# Patient Record
Sex: Male | Born: 1960
Health system: Southern US, Community
[De-identification: ages and names within clinical notes are randomized; demographics above are authoritative.]

## PROBLEM LIST (undated history)

## (undated) ENCOUNTER — Ambulatory Visit (HOSPITAL_COMMUNITY): Admission: EM | Payer: BC Managed Care – PPO

## (undated) DIAGNOSIS — Z87442 Personal history of urinary calculi: Secondary | ICD-10-CM

## (undated) DIAGNOSIS — D352 Benign neoplasm of pituitary gland: Secondary | ICD-10-CM

## (undated) DIAGNOSIS — Z87898 Personal history of other specified conditions: Secondary | ICD-10-CM

## (undated) DIAGNOSIS — D649 Anemia, unspecified: Secondary | ICD-10-CM

## (undated) DIAGNOSIS — H3320 Serous retinal detachment, unspecified eye: Secondary | ICD-10-CM

## (undated) DIAGNOSIS — N189 Chronic kidney disease, unspecified: Secondary | ICD-10-CM

## (undated) DIAGNOSIS — Z8639 Personal history of other endocrine, nutritional and metabolic disease: Secondary | ICD-10-CM

## (undated) DIAGNOSIS — I10 Essential (primary) hypertension: Secondary | ICD-10-CM

## (undated) DIAGNOSIS — E119 Type 2 diabetes mellitus without complications: Secondary | ICD-10-CM

## (undated) DIAGNOSIS — H543 Unqualified visual loss, both eyes: Secondary | ICD-10-CM

## (undated) HISTORY — PX: EYE SURGERY: SHX253

## (undated) HISTORY — PX: NO PAST SURGERIES: SHX2092

## (undated) HISTORY — PX: COLONOSCOPY: SHX174

## (undated) HISTORY — DX: Chronic kidney disease, unspecified: N18.9

## (undated) HISTORY — DX: Anemia, unspecified: D64.9

## (undated) HISTORY — DX: Benign neoplasm of pituitary gland: D35.2

## (undated) HISTORY — DX: Personal history of other endocrine, nutritional and metabolic disease: Z86.39

---

## 1985-03-31 HISTORY — PX: KIDNEY STONE SURGERY: SHX686

## 2014-07-18 ENCOUNTER — Observation Stay (HOSPITAL_COMMUNITY)
Admission: EM | Admit: 2014-07-18 | Discharge: 2014-07-19 | Disposition: A | Discharge status: Home / Self Care | Payer: BLUE CROSS/BLUE SHIELD | Attending: Internal Medicine | Admitting: Internal Medicine

## 2014-07-18 ENCOUNTER — Encounter (HOSPITAL_COMMUNITY): Payer: Self-pay | Admitting: Emergency Medicine

## 2014-07-18 ENCOUNTER — Observation Stay (HOSPITAL_COMMUNITY): Payer: BLUE CROSS/BLUE SHIELD

## 2014-07-18 ENCOUNTER — Emergency Department (HOSPITAL_COMMUNITY): Payer: BLUE CROSS/BLUE SHIELD

## 2014-07-18 DIAGNOSIS — D352 Benign neoplasm of pituitary gland: Secondary | ICD-10-CM | POA: Diagnosis not present

## 2014-07-18 DIAGNOSIS — R350 Frequency of micturition: Secondary | ICD-10-CM | POA: Insufficient documentation

## 2014-07-18 DIAGNOSIS — Z79899 Other long term (current) drug therapy: Secondary | ICD-10-CM | POA: Diagnosis not present

## 2014-07-18 DIAGNOSIS — IMO0002 Reserved for concepts with insufficient information to code with codable children: Secondary | ICD-10-CM | POA: Diagnosis present

## 2014-07-18 DIAGNOSIS — E221 Hyperprolactinemia: Secondary | ICD-10-CM | POA: Insufficient documentation

## 2014-07-18 DIAGNOSIS — R51 Headache: Secondary | ICD-10-CM | POA: Diagnosis present

## 2014-07-18 DIAGNOSIS — E1165 Type 2 diabetes mellitus with hyperglycemia: Secondary | ICD-10-CM | POA: Diagnosis not present

## 2014-07-18 DIAGNOSIS — Z6836 Body mass index (BMI) 36.0-36.9, adult: Secondary | ICD-10-CM | POA: Diagnosis not present

## 2014-07-18 DIAGNOSIS — I10 Essential (primary) hypertension: Principal | ICD-10-CM | POA: Insufficient documentation

## 2014-07-18 DIAGNOSIS — R0602 Shortness of breath: Secondary | ICD-10-CM

## 2014-07-18 DIAGNOSIS — Z7982 Long term (current) use of aspirin: Secondary | ICD-10-CM | POA: Diagnosis not present

## 2014-07-18 DIAGNOSIS — I169 Hypertensive crisis, unspecified: Secondary | ICD-10-CM | POA: Diagnosis present

## 2014-07-18 DIAGNOSIS — R930 Abnormal findings on diagnostic imaging of skull and head, not elsewhere classified: Secondary | ICD-10-CM

## 2014-07-18 DIAGNOSIS — I160 Hypertensive urgency: Secondary | ICD-10-CM

## 2014-07-18 HISTORY — DX: Essential (primary) hypertension: I10

## 2014-07-18 HISTORY — DX: Type 2 diabetes mellitus without complications: E11.9

## 2014-07-18 LAB — COMPREHENSIVE METABOLIC PANEL
ALT: 19 U/L (ref 0–53)
ANION GAP: 9 (ref 5–15)
AST: 19 U/L (ref 0–37)
Albumin: 3.7 g/dL (ref 3.5–5.2)
Alkaline Phosphatase: 105 U/L (ref 39–117)
BUN: 16 mg/dL (ref 6–23)
CALCIUM: 9.3 mg/dL (ref 8.4–10.5)
CHLORIDE: 99 mmol/L (ref 96–112)
CO2: 28 mmol/L (ref 19–32)
CREATININE: 1 mg/dL (ref 0.50–1.35)
GFR, EST NON AFRICAN AMERICAN: 82 mL/min — AB (ref >90)
GLUCOSE: 380 mg/dL — AB (ref 70–99)
Potassium: 3.8 mmol/L (ref 3.5–5.1)
SODIUM: 136 mmol/L (ref 135–145)
Total Bilirubin: 0.5 mg/dL (ref 0.3–1.2)
Total Protein: 7.8 g/dL (ref 6.0–8.3)

## 2014-07-18 LAB — CBC WITH DIFFERENTIAL/PLATELET
Basophils Absolute: 0 10*3/uL (ref 0.0–0.1)
Basophils Relative: 0 % (ref 0–1)
Eosinophils Absolute: 0.1 10*3/uL (ref 0.0–0.7)
Eosinophils Relative: 2 % (ref 0–5)
HCT: 37.1 % — ABNORMAL LOW (ref 39.0–52.0)
HEMOGLOBIN: 11.9 g/dL — AB (ref 13.0–17.0)
Lymphocytes Relative: 21 % (ref 12–46)
Lymphs Abs: 1 10*3/uL (ref 0.7–4.0)
MCH: 28.5 pg (ref 26.0–34.0)
MCHC: 32.1 g/dL (ref 30.0–36.0)
MCV: 89 fL (ref 78.0–100.0)
MONOS PCT: 8 % (ref 3–12)
Monocytes Absolute: 0.4 10*3/uL (ref 0.1–1.0)
NEUTROS PCT: 69 % (ref 43–77)
Neutro Abs: 3.3 10*3/uL (ref 1.7–7.7)
Platelets: 230 10*3/uL (ref 150–400)
RBC: 4.17 MIL/uL — AB (ref 4.22–5.81)
RDW: 12.7 % (ref 11.5–15.5)
WBC: 4.8 10*3/uL (ref 4.0–10.5)

## 2014-07-18 LAB — URINALYSIS, ROUTINE W REFLEX MICROSCOPIC
Bilirubin Urine: NEGATIVE
KETONES UR: 15 mg/dL — AB
LEUKOCYTES UA: NEGATIVE
Nitrite: NEGATIVE
Protein, ur: 100 mg/dL — AB
Specific Gravity, Urine: 1.026 (ref 1.005–1.030)
UROBILINOGEN UA: 0.2 mg/dL (ref 0.0–1.0)
pH: 6 (ref 5.0–8.0)

## 2014-07-18 LAB — BASIC METABOLIC PANEL
Anion gap: 10 (ref 5–15)
BUN: 16 mg/dL (ref 6–23)
CO2: 29 mmol/L (ref 19–32)
CREATININE: 1.13 mg/dL (ref 0.50–1.35)
Calcium: 9.1 mg/dL (ref 8.4–10.5)
Chloride: 97 mmol/L (ref 96–112)
GFR, EST AFRICAN AMERICAN: 82 mL/min — AB (ref >90)
GFR, EST NON AFRICAN AMERICAN: 71 mL/min — AB (ref >90)
GLUCOSE: 324 mg/dL — AB (ref 70–99)
Potassium: 4.5 mmol/L (ref 3.5–5.1)
Sodium: 136 mmol/L (ref 135–145)

## 2014-07-18 LAB — URINE MICROSCOPIC-ADD ON

## 2014-07-18 LAB — GLUCOSE, CAPILLARY
GLUCOSE-CAPILLARY: 291 mg/dL — AB (ref 70–99)
GLUCOSE-CAPILLARY: 345 mg/dL — AB (ref 70–99)
GLUCOSE-CAPILLARY: 221 mg/dL — AB (ref 70–99)
Glucose-Capillary: 379 mg/dL — ABNORMAL HIGH (ref 70–99)
Glucose-Capillary: 388 mg/dL — ABNORMAL HIGH (ref 70–99)

## 2014-07-18 LAB — BRAIN NATRIURETIC PEPTIDE: B Natriuretic Peptide: 87.3 pg/mL (ref 0.0–100.0)

## 2014-07-18 LAB — TROPONIN I: Troponin I: <0.03 ng/mL (ref <0.031)

## 2014-07-18 LAB — T4, FREE: Free T4: 1.01 ng/dL (ref 0.80–1.80)

## 2014-07-18 LAB — TSH: TSH: 0.404 u[IU]/mL (ref 0.350–4.500)

## 2014-07-18 MED ORDER — OXYCODONE HCL 5 MG PO TABS: 5.0000 mg | ORAL_TABLET | ORAL | Status: DC | PRN

## 2014-07-18 MED ORDER — ACETAMINOPHEN 650 MG RE SUPP: 650.0000 mg | Freq: Four times a day (QID) | RECTAL | Status: DC | PRN

## 2014-07-18 MED ORDER — SODIUM CHLORIDE 0.9 % IV SOLN: 250.0000 mL | INTRAVENOUS | Status: DC | PRN

## 2014-07-18 MED ORDER — SODIUM CHLORIDE 0.9 % IJ SOLN: 3.0000 mL | Freq: Two times a day (BID) | INTRAMUSCULAR | Status: DC

## 2014-07-18 MED ORDER — INSULIN ASPART 100 UNIT/ML ~~LOC~~ SOLN
10.0000 [IU] | Freq: Once | SUBCUTANEOUS | Status: AC
  Administered 2014-07-18: 10 [IU] via SUBCUTANEOUS

## 2014-07-18 MED ORDER — METOPROLOL SUCCINATE ER 50 MG PO TB24
50.0000 mg | ORAL_TABLET | Freq: Every day | ORAL | Status: DC
  Administered 2014-07-19: 50 mg via ORAL
  Filled 2014-07-18: qty 1

## 2014-07-18 MED ORDER — HEPARIN SODIUM (PORCINE) 5000 UNIT/ML IJ SOLN
5000.0000 [IU] | Freq: Three times a day (TID) | INTRAMUSCULAR | Status: DC
  Administered 2014-07-18 – 2014-07-19 (×5): 5000 [IU] via SUBCUTANEOUS
  Filled 2014-07-18 (×7): qty 1

## 2014-07-18 MED ORDER — INSULIN ASPART 100 UNIT/ML ~~LOC~~ SOLN
0.0000 [IU] | Freq: Every day | SUBCUTANEOUS | Status: DC
  Administered 2014-07-18: 2 [IU] via SUBCUTANEOUS

## 2014-07-18 MED ORDER — ONDANSETRON HCL 4 MG PO TABS
4.0000 mg | ORAL_TABLET | Freq: Four times a day (QID) | ORAL | Status: DC | PRN
  Filled 2014-07-18: qty 1

## 2014-07-18 MED ORDER — AMLODIPINE BESYLATE 10 MG PO TABS
10.0000 mg | ORAL_TABLET | Freq: Every day | ORAL | Status: DC
  Administered 2014-07-18 – 2014-07-19 (×2): 10 mg via ORAL
  Filled 2014-07-18 (×2): qty 1

## 2014-07-18 MED ORDER — ASPIRIN EC 81 MG PO TBEC
81.0000 mg | DELAYED_RELEASE_TABLET | Freq: Every day | ORAL | Status: DC
  Administered 2014-07-18 – 2014-07-19 (×2): 81 mg via ORAL
  Filled 2014-07-18 (×2): qty 1

## 2014-07-18 MED ORDER — INSULIN ASPART 100 UNIT/ML ~~LOC~~ SOLN
12.0000 [IU] | Freq: Once | SUBCUTANEOUS | Status: AC
  Administered 2014-07-18: 12 [IU] via SUBCUTANEOUS

## 2014-07-18 MED ORDER — ACETAMINOPHEN 325 MG PO TABS
650.0000 mg | ORAL_TABLET | Freq: Four times a day (QID) | ORAL | Status: DC | PRN
  Administered 2014-07-19 (×2): 650 mg via ORAL
  Filled 2014-07-18 (×3): qty 2

## 2014-07-18 MED ORDER — NICARDIPINE HCL IN NACL 20-0.86 MG/200ML-% IV SOLN
3.0000 mg/h | Freq: Once | INTRAVENOUS | Status: AC
  Administered 2014-07-18: 5 mg/h via INTRAVENOUS
  Filled 2014-07-18: qty 200

## 2014-07-18 MED ORDER — ONDANSETRON HCL 4 MG/2ML IJ SOLN
4.0000 mg | Freq: Once | INTRAMUSCULAR | Status: AC
  Administered 2014-07-18: 4 mg via INTRAVENOUS
  Filled 2014-07-18: qty 2

## 2014-07-18 MED ORDER — SODIUM CHLORIDE 0.9 % IJ SOLN
3.0000 mL | Freq: Two times a day (BID) | INTRAMUSCULAR | Status: DC
  Administered 2014-07-18 – 2014-07-19 (×3): 3 mL via INTRAVENOUS

## 2014-07-18 MED ORDER — INSULIN ASPART 100 UNIT/ML ~~LOC~~ SOLN
0.0000 [IU] | Freq: Three times a day (TID) | SUBCUTANEOUS | Status: DC
  Administered 2014-07-18: 11 [IU] via SUBCUTANEOUS
  Administered 2014-07-18: 15 [IU] via SUBCUTANEOUS
  Administered 2014-07-19 (×3): 8 [IU] via SUBCUTANEOUS

## 2014-07-18 MED ORDER — GLIPIZIDE ER 10 MG PO TB24
10.0000 mg | ORAL_TABLET | Freq: Every day | ORAL | Status: DC
  Administered 2014-07-19: 10 mg via ORAL
  Filled 2014-07-18 (×2): qty 1

## 2014-07-18 MED ORDER — ALUM & MAG HYDROXIDE-SIMETH 200-200-20 MG/5ML PO SUSP: 30.0000 mL | Freq: Four times a day (QID) | ORAL | Status: DC | PRN

## 2014-07-18 MED ORDER — GADOBENATE DIMEGLUMINE 529 MG/ML IV SOLN
15.0000 mL | Freq: Once | INTRAVENOUS | Status: AC | PRN
  Administered 2014-07-18: 15 mL via INTRAVENOUS

## 2014-07-18 MED ORDER — ONDANSETRON HCL 4 MG/2ML IJ SOLN
4.0000 mg | Freq: Four times a day (QID) | INTRAMUSCULAR | Status: DC | PRN
  Administered 2014-07-18: 4 mg via INTRAVENOUS
  Filled 2014-07-18: qty 2

## 2014-07-18 MED ORDER — ISOSORBIDE MONONITRATE ER 60 MG PO TB24
60.0000 mg | ORAL_TABLET | Freq: Every day | ORAL | Status: DC
  Administered 2014-07-18 – 2014-07-19 (×2): 60 mg via ORAL
  Filled 2014-07-18 (×2): qty 1

## 2014-07-18 MED ORDER — GLIPIZIDE ER 5 MG PO TB24
5.0000 mg | ORAL_TABLET | Freq: Every day | ORAL | Status: DC
  Administered 2014-07-18: 5 mg via ORAL
  Filled 2014-07-18 (×2): qty 1

## 2014-07-18 MED ORDER — SODIUM CHLORIDE 0.9 % IJ SOLN: 3.0000 mL | INTRAMUSCULAR | Status: DC | PRN

## 2014-07-18 MED ORDER — METOPROLOL SUCCINATE ER 50 MG PO TB24
50.0000 mg | ORAL_TABLET | Freq: Once | ORAL | Status: AC
  Administered 2014-07-18: 50 mg via ORAL
  Filled 2014-07-18 (×2): qty 1

## 2014-07-18 NOTE — ED Notes (Signed)
Pt states about 7pm he just generally did not feel good  Pt states he felt nauseated and had a headache  Pt states he checked his pressure and it was 240/127  Pt states he took an extra dose of Imdur and laid down  Pt states he was unable to go to sleep  Pt states when he got up to walk around he had dyspnia on exertion  Pt denies chest pain  Pt rating his headache pain an 8/10 and the pain is in the middle of his head  No neuro deficits noted

## 2014-07-18 NOTE — ED Notes (Signed)
Patient transported to CT 

## 2014-07-18 NOTE — Progress Notes (Addendum)
Progress Note  MRI/MRA followed was up. He has a 2.3 x 2 x 1.8 cm mass centered in the sella with suprasellar extension, having appearance of pituitary macroadenoma. Will assess basal hormonal function, will check Prolactin, AM Cortisol level, FSH/LH, TSH, FT3, FT4, GH. I discussed case with Dr Earnie Larsson of Neurosurgery who reviewed scans. There is no need for emergent surgery at this time however surgery will likely be a future intervention, particularly with compression on the optic chiasm and optic tracts. Patient will need to follow up with NS in 1 week. Follow up appointment requested on EPIC.    These findings and recommendations were reviewed with patient.

## 2014-07-18 NOTE — ED Notes (Signed)
Per EMS pt has hypertension and takes Imdur  Pt states about 7pm last night he started feeling like his pressure was elevated so he took an extra blood pressure tablet  Pt is c/o headache, nausea  Pt is also a type 2 diabetic and pts blood sugar is elevated at 388

## 2014-07-18 NOTE — ED Notes (Signed)
Cardene drip increased to 7.5 mg/h

## 2014-07-18 NOTE — ED Provider Notes (Signed)
CSN: [id number]     Arrival date & time 07/18/14  0222 History   First MD Initiated Contact with Patient 07/18/14 0254     Chief Complaint  Patient presents with  . Hypertension     (Consider location/radiation/quality/duration/timing/severity/associated sxs/prior Treatment) HPI Comments: Patient presents to the ER for evaluation of headache and elevated blood pressure. Patient reported that he started to feel weak this evening. He noticed that he was very short of breath with minimal exertion, but did not have any chest pain. He checked his blood pressure was 240/127. He took a dose of Imdur and laid down, thinking that he would feel better if he went to sleep. When he could not sleep, he noticed worsening dyspnea on exertion and developed a headache. Patient reports persistent throbbing pain in the middle of his head, 8 out of 10. Denies vision disturbance. Has not had any numbness, tingling or weakness in his extremities.  Patient is a 54 y.o. male presenting with hypertension.  Hypertension Associated symptoms include headaches and shortness of breath.    Past Medical History  Diagnosis Date  . Diabetes mellitus without complication   . Hypertension    History reviewed. No pertinent past surgical history. Family History  Problem Relation Age of Onset  . Cancer Other   . Diabetes Other   . Hypertension Other    History  Substance Use Topics  . Smoking status: Never Smoker   . Smokeless tobacco: Not on file  . Alcohol Use: No    Review of Systems  Respiratory: Positive for shortness of breath.   Neurological: Positive for headaches.  All other systems reviewed and are negative.     Allergies  Review of patient's allergies indicates no known allergies.  Home Medications   Prior to Admission medications   Medication Sig Start Date End Date Taking? Authorizing Provider  amLODipine (NORVASC) 10 MG tablet Take 10 mg by mouth daily.   Yes Historical Provider, MD   aspirin EC 81 MG tablet Take 81 mg by mouth daily.   Yes Historical Provider, MD  glipiZIDE (GLUCOTROL XL) 5 MG 24 hr tablet Take 5 mg by mouth daily with breakfast.   Yes Historical Provider, MD  isosorbide mononitrate (IMDUR) 60 MG 24 hr tablet Take 60 mg by mouth daily.   Yes Historical Provider, MD  metoprolol succinate (TOPROL-XL) 50 MG 24 hr tablet Take 50 mg by mouth daily. Take with or immediately following a meal.   Yes Historical Provider, MD   BP 159/81 mmHg  Pulse 98  Temp(Src) 98.5 F (36.9 C) (Oral)  Resp 22  SpO2 92% Physical Exam  Constitutional: He is oriented to person, place, and time. He appears well-developed and well-nourished. No distress.  HENT:  Head: Normocephalic and atraumatic.  Right Ear: Hearing normal.  Left Ear: Hearing normal.  Nose: Nose normal.  Mouth/Throat: Oropharynx is clear and moist and mucous membranes are normal.  Eyes: Conjunctivae and EOM are normal. Pupils are equal, round, and reactive to light.  Neck: Normal range of motion. Neck supple.  Cardiovascular: Regular rhythm, S1 normal and S2 normal.  Exam reveals no gallop and no friction rub.   No murmur heard. Pulmonary/Chest: Effort normal and breath sounds normal. No respiratory distress. He exhibits no tenderness.  Abdominal: Soft. Normal appearance and bowel sounds are normal. There is no hepatosplenomegaly. There is no tenderness. There is no rebound, no guarding, no tenderness at McBurney's point and negative Murphy's sign. No hernia.  Musculoskeletal: Normal range  of motion.  Neurological: He is alert and oriented to person, place, and time. He has normal strength. No cranial nerve deficit or sensory deficit. Coordination normal. GCS eye subscore is 4. GCS verbal subscore is 5. GCS motor subscore is 6.  Normal strength, sensation in all 4 extremities. Patient is awake, alert and oriented.  Patient's gait is unsteady, but no focal neurologic deficits noted otherwise  Skin: Skin is  warm, dry and intact. No rash noted. No cyanosis.  Psychiatric: He has a normal mood and affect. His speech is normal and behavior is normal. Thought content normal.  Nursing note and vitals reviewed.   ED Course  Procedures (including critical care time) Labs Review Labs Reviewed  CBC WITH DIFFERENTIAL/PLATELET - Abnormal; Notable for the following:    RBC 4.17 (*)    Hemoglobin 11.9 (*)    HCT 37.1 (*)    All other components within normal limits  COMPREHENSIVE METABOLIC PANEL - Abnormal; Notable for the following:    Glucose, Bld 380 (*)    GFR calc non Af Amer 82 (*)    All other components within normal limits  TROPONIN I  BRAIN NATRIURETIC PEPTIDE    Imaging Review Ct Head Wo Contrast  07/18/2014   CLINICAL DATA:  Nausea and headache.  Hypertension.  EXAM: CT HEAD WITHOUT CONTRAST  TECHNIQUE: Contiguous axial images were obtained from the base of the skull through the vertex without intravenous contrast.  COMPARISON:  None.  FINDINGS: 1.4 x 1.7 cm hyper attenuating mass within the sella with suprasellar extension. Associated osseous remodeling. Maintained gray-white differentiation. No hydrocephalus. No intraparenchymal hemorrhage. No abnormal extra-axial fluid collection. No CT evidence of an acute infarction. The visualized paranasal sinuses and mastoid air cells are predominantly clear.  IMPRESSION: 1.4 x 1.7 cm hyper attenuating mass within the sella/suprasellar. Differential includes Rathke's cleft cyst, pituitary adenoma, or aneurysm. Recommend brain MRI.   Electronically Signed   By: Carlos Levering M.D.   On: 07/18/2014 07:11   Dg Chest Port 1 View  07/18/2014   CLINICAL DATA:  Short of breath.  EXAM: PORTABLE CHEST - 1 VIEW  COMPARISON:  None.  FINDINGS: Mediastinum and hilar structures normal. Low lung volumes with mild basilar atelectasis. No pleural effusion or pneumothorax. Heart size normal. No acute bony abnormality.  IMPRESSION: Low lung volumes with mild basilar  atelectasis. Exam otherwise unremarkable.   Electronically Signed   By: Marcello Moores  Register   On: 07/18/2014 07:03     EKG Interpretation   Date/Time:  Tuesday July 18 2014 02:32:36 EDT Ventricular Rate:  80 PR Interval:  163 QRS Duration: 90 QT Interval:  363 QTC Calculation: 419 R Axis:   -10 Text Interpretation:  Sinus rhythm Probable left atrial enlargement  Borderline T wave abnormalities No previous tracing Confirmed by Loki Wuthrich   MD, Francelia Mclaren [id number]) on 07/18/2014 3:08:18 AM      MDM   Final diagnoses:  Shortness of breath  Hypertensive urgency    Patient presented ER for evaluation of headache, dyspnea on exertion, markedly elevated blood pressure. Patient reports a blood pressure of 240/127 at home. He did take additional blood pressure medication without relief of his symptoms. He developed progressively worsening headache associated with blood pressure. He does not normally have headaches. He did not have any focal neurologic deficits, but didn't appear unsteady on his feet when walking here in the ER. Patient's blood work is entirely normal. This included EKG and troponin. He is not expressing chest pain, but  did have dyspnea on exertion. No evidence of congestive heart failure.  Patient was initiated on a Cardene drip. Patient's blood pressure did immediately improve and his headache resolved. A CT of his head was performed. No evidence of acute intracranial bleed or other acute abnormality noted. He does, however, have a hyperdense area in the sella or suprasellar region. This could be a cyst, adenoma or possibly aneurysm. MRI recommended him will be performed. Patient to be admitted for further management of hypertensive urgency.    Orpah Greek, MD 07/18/14 [phone]

## 2014-07-18 NOTE — ED Notes (Signed)
Cardene drip decreased to 5mg /h

## 2014-07-18 NOTE — ED Notes (Signed)
Cardene drip increased to 10mg /h

## 2014-07-18 NOTE — ED Notes (Signed)
Returned from CT.

## 2014-07-18 NOTE — ED Notes (Signed)
Cardene drip decreased to 7.5mg /h

## 2014-07-18 NOTE — ED Notes (Signed)
Ambulated pt to the restroom  Gait slightly unsteady  Pt states he just does not feel quite right  States continues to feels a little short of breath on exertion  Spoke with EDP regarding pt condition

## 2014-07-18 NOTE — Progress Notes (Signed)
[facility] History and Physical  Sigismund Meskill [id number] DOB: 06-03-1960 DOA: 07/18/2014  Referring physician:  PCP: No primary care provider on file.   Chief Complaint: Headache/nausea  HPI: Roberto [name] is a 54 y.o. male with a past medical history of hypertension and diabetes mellitus presenting to the emergency department this morning with complaints of headache and nausea. He reported feeling ill yesterday evening becoming generally weak and having shortness of breath with physical exertion. He also experienced nausea without emesis. He checked his blood pressure at home having a reading of 240/127. Patient took dose of Imdur 60 mg by mouth overnight. Symptoms did not improve for which he presented to the emergency department. Initially he does not have a blood pressure 190/102 and started on a Cardene drip, with subsequent downward trend in blood pressures, having her last blood pressure 165/91. Cardene drip was discontinued in the emergency room. Patient reports feeling much better.  CT scan of brain did not show evidence of intracranial bleed however radiology reporting a 1.4 x 1.7 cm hyper attenuating mass within the sella/suprasellar region.                                                                                                                                                                                                                                Review of Systems:  Constitutional:  No weight loss, night sweats, Fevers, chills, fatigue, positive for generalized weakness.  HEENT:  No headaches, Difficulty swallowing,Tooth/dental problems,Sore throat,  No sneezing, itching, ear ache, nasal congestion, post nasal drip,  Cardio-vascular:  No chest pain, Orthopnea, PND, swelling in lower extremities, anasarca, dizziness, palpitations  GI:  No heartburn, indigestion, abdominal pain, nausea, vomiting, diarrhea, change in bowel habits, loss of appetite  Resp:   Positive forshortness of breath with exertion or at rest. No excess mucus, no productive cough, No non-productive cough, No coughing up of blood.No change in color of mucus.No wheezing.No chest wall deformity  Skin:  no rash or lesions.  GU:  no dysuria, change in color of urine, no urgency or frequency. No flank pain.  Musculoskeletal:  No joint pain or swelling. No decreased range of motion. No back pain.  Psych:  No change in mood or affect. No depression or anxiety. No memory loss.   Past Medical History  Diagnosis Date  . Diabetes mellitus without complication   . Hypertension    History reviewed. No pertinent past surgical history.  Social History:  reports that he has never smoked. He does not have any smokeless tobacco history on file. He reports that he does not drink alcohol or use illicit drugs.  No Known Allergies  Family History  Problem Relation Age of Onset  . Cancer Other   . Diabetes Other   . Hypertension Other      Prior to Admission medications   Medication Sig Start Date End Date Taking? Authorizing Provider  amLODipine (NORVASC) 10 MG tablet Take 10 mg by mouth daily.   Yes Historical Provider, MD  aspirin EC 81 MG tablet Take 81 mg by mouth daily.   Yes Historical Provider, MD  glipiZIDE (GLUCOTROL XL) 5 MG 24 hr tablet Take 5 mg by mouth daily with breakfast.   Yes Historical Provider, MD  isosorbide mononitrate (IMDUR) 60 MG 24 hr tablet Take 60 mg by mouth daily.   Yes Historical Provider, MD  metoprolol succinate (TOPROL-XL) 50 MG 24 hr tablet Take 50 mg by mouth daily. Take with or immediately following a meal.   Yes Historical Provider, MD   Physical Exam: Filed Vitals:   07/18/14 0730 07/18/14 0745 07/18/14 0800 07/18/14 0815  BP: 152/85 149/69 165/91 175/92  Pulse: 96 94 93 92  Temp:      TempSrc:      Resp:      SpO2: 93% 93% 95% 99%    Wt Readings from Last 3 Encounters:  No data found for Wt    General:  well-nourished well-developed  male, in no acute distress awake and alert following commands. Eyes: PERRL, normal lids, irises & conjunctiva ENT: grossly normal hearing, lips & tongue Neck: no LAD, masses or thyromegaly Cardiovascular: RRR, no m/r/g. He has 2+ bilateral extremity pitting edema Telemetry: SR, no arrhythmias  Respiratory: CTA bilaterally, no w/r/r. Normal respiratory effort.  Abdomen: soft, ntnd Skin: no rash or induration seen on limited exam Musculoskeletal: Has 2+ bilateral extremity pitting edema  Psychiatric: grossly normal mood and affect, speech fluent and appropriate Neurologic: grossly non-focal.          Labs on Admission:  Basic Metabolic Panel:  Recent Labs Lab 07/18/14 0325  NA 136  K 3.8  CL 99  CO2 28  GLUCOSE 380*  BUN 16  CREATININE 1.00  CALCIUM 9.3   Liver Function Tests:  Recent Labs Lab 07/18/14 0325  AST 19  ALT 19  ALKPHOS 105  BILITOT 0.5  PROT 7.8  ALBUMIN 3.7   No results for input(s): LIPASE, AMYLASE in the last 168 hours. No results for input(s): AMMONIA in the last 168 hours. CBC:  Recent Labs Lab 07/18/14 0325  WBC 4.8  NEUTROABS 3.3  HGB 11.9*  HCT 37.1*  MCV 89.0  PLT 230   Cardiac Enzymes:  Recent Labs Lab 07/18/14 0325  TROPONINI <0.03    BNP (last 3 results)  Recent Labs  07/18/14 0326  BNP 87.3    ProBNP (last 3 results) No results for input(s): PROBNP in the last 8760 hours.  CBG: No results for input(s): GLUCAP in the last 168 hours.  Radiological Exams on Admission: Ct Head Wo Contrast  07/18/2014   CLINICAL DATA:  Nausea and headache.  Hypertension.  EXAM: CT HEAD WITHOUT CONTRAST  TECHNIQUE: Contiguous axial images were obtained from the base of the skull through the vertex without intravenous contrast.  COMPARISON:  None.  FINDINGS: 1.4 x 1.7 cm hyper attenuating mass within the sella with suprasellar extension. Associated osseous remodeling. Maintained gray-white  differentiation. No hydrocephalus. No  intraparenchymal hemorrhage. No abnormal extra-axial fluid collection. No CT evidence of an acute infarction. The visualized paranasal sinuses and mastoid air cells are predominantly clear.  IMPRESSION: 1.4 x 1.7 cm hyper attenuating mass within the sella/suprasellar. Differential includes Rathke's cleft cyst, pituitary adenoma, or aneurysm. Recommend brain MRI.   Electronically Signed   By: Carlos Levering M.D.   On: 07/18/2014 07:11   Dg Chest Port 1 View  07/18/2014   CLINICAL DATA:  Short of breath.  EXAM: PORTABLE CHEST - 1 VIEW  COMPARISON:  None.  FINDINGS: Mediastinum and hilar structures normal. Low lung volumes with mild basilar atelectasis. No pleural effusion or pneumothorax. Heart size normal. No acute bony abnormality.  IMPRESSION: Low lung volumes with mild basilar atelectasis. Exam otherwise unremarkable.   Electronically Signed   By: Marcello Moores  Register   On: 07/18/2014 07:03    EKG: Independently reviewed.   Assessment/Plan Active Problems:   Hypertensive urgency   DM (diabetes mellitus), type 2, uncontrolled   Hypertensive crisis   1. Hypertensive crisis. Patient is a pleasant 54 year old gentleman with history of hypertension who takes metoprolol 50 mg by mouth daily, Norvasc 10 mg by mouth daily, Imdur 50 mg by mouth daily, felt ill overnight and found to have a blood pressure of 220/120. He took 60 mg of imdur however continue to feel ill. In the emergency room he was found to have a blood pressure of 198/102, placed on a Cardene drip with blood pressures trending down. Cardene has has been discontinued in the emergency room, will restart his oral regimen and give him metoprolol 50 mg by mouth now. Continue amlodipine 10 mg by mouth daily and Imdur 60 mg by mouth daily. Monitor blood pressures over the course of the day.  2. Type 2 diabetes mellitus.  On presentation patient having a glucose of 380 oh which she was given 12 units of insulin in the emergency department. Will place  him on Accu-Cheks before every meal and at bedtime with slight scale coverage. Will continue glipizide 5 mg by mouth daily.  3. Shortness of breath/lower extremity pitting edema. Patient reporting shortness of breath overnight with exertion, having bilateral extremity pitting edema on exam. Will further evaluate with a transthoracic echocardiogram today. He currently denies chest pain, having a troponin less than 0.03.  4.  Abnormal head CT. CT scan of brain without contrast performed in the emergency room showed a 1.4 x 1.7 cm hyperattenuating mass within the sella/suprasellar region. Will further workup with an MRI/MRA of brain  5.  DVT prophylaxis. Subcutaneous heparin    Code Status: full code  Family Communication: family not present  Disposition Plan: will place patient in overnight observation, do not anticipate him requiring greater than 2 night hospitalization.   Time spent: 65 min  Kelvin Cellar [facility] Pager [phone]

## 2014-07-18 NOTE — ED Notes (Signed)
MD at bedside. 

## 2014-07-19 DIAGNOSIS — E221 Hyperprolactinemia: Secondary | ICD-10-CM | POA: Diagnosis not present

## 2014-07-19 DIAGNOSIS — D352 Benign neoplasm of pituitary gland: Secondary | ICD-10-CM | POA: Diagnosis not present

## 2014-07-19 DIAGNOSIS — R0602 Shortness of breath: Secondary | ICD-10-CM | POA: Diagnosis not present

## 2014-07-19 DIAGNOSIS — E1165 Type 2 diabetes mellitus with hyperglycemia: Secondary | ICD-10-CM | POA: Diagnosis not present

## 2014-07-19 DIAGNOSIS — I10 Essential (primary) hypertension: Secondary | ICD-10-CM | POA: Diagnosis not present

## 2014-07-19 LAB — CBC
HEMATOCRIT: 38 % — AB (ref 39.0–52.0)
HEMOGLOBIN: 12.1 g/dL — AB (ref 13.0–17.0)
MCH: 29 pg (ref 26.0–34.0)
MCHC: 31.8 g/dL (ref 30.0–36.0)
MCV: 91.1 fL (ref 78.0–100.0)
Platelets: 199 10*3/uL (ref 150–400)
RBC: 4.17 MIL/uL — AB (ref 4.22–5.81)
RDW: 12.9 % (ref 11.5–15.5)
WBC: 5.3 10*3/uL (ref 4.0–10.5)

## 2014-07-19 LAB — CORTISOL: CORTISOL PLASMA: 8.4 ug/dL

## 2014-07-19 LAB — BASIC METABOLIC PANEL
ANION GAP: 8 (ref 5–15)
BUN: 16 mg/dL (ref 6–23)
CALCIUM: 9 mg/dL (ref 8.4–10.5)
CHLORIDE: 99 mmol/L (ref 96–112)
CO2: 29 mmol/L (ref 19–32)
Creatinine, Ser: 1.02 mg/dL (ref 0.50–1.35)
GFR calc non Af Amer: 80 mL/min — ABNORMAL LOW (ref >90)
Glucose, Bld: 268 mg/dL — ABNORMAL HIGH (ref 70–99)
Potassium: 3.7 mmol/L (ref 3.5–5.1)
Sodium: 136 mmol/L (ref 135–145)

## 2014-07-19 LAB — GLUCOSE, CAPILLARY
GLUCOSE-CAPILLARY: 261 mg/dL — AB (ref 70–99)
Glucose-Capillary: 266 mg/dL — ABNORMAL HIGH (ref 70–99)
Glucose-Capillary: 295 mg/dL — ABNORMAL HIGH (ref 70–99)

## 2014-07-19 LAB — FOLLICLE STIMULATING HORMONE: FSH: 1.5 m[IU]/mL (ref 1.5–12.4)

## 2014-07-19 LAB — GROWTH HORMONE: Growth Hormone: 0.3 ng/mL (ref 0.0–10.0)

## 2014-07-19 LAB — HEMOGLOBIN A1C
HEMOGLOBIN A1C: 12.1 % — AB (ref 4.8–5.6)
MEAN PLASMA GLUCOSE: 301 mg/dL

## 2014-07-19 LAB — LUTEINIZING HORMONE: LH: 1.6 m[IU]/mL — ABNORMAL LOW (ref 1.7–8.6)

## 2014-07-19 LAB — PROLACTIN: PROLACTIN: 1312 ng/mL — AB (ref 4.0–15.2)

## 2014-07-19 MED ORDER — LISINOPRIL 10 MG PO TABS: 10.0000 mg | ORAL_TABLET | Freq: Two times a day (BID) | ORAL | Status: DC

## 2014-07-19 MED ORDER — CIPROFLOXACIN HCL 250 MG PO TABS: 250.0000 mg | ORAL_TABLET | Freq: Two times a day (BID) | ORAL | Status: DC

## 2014-07-19 MED ORDER — LISINOPRIL 10 MG PO TABS
10.0000 mg | ORAL_TABLET | Freq: Two times a day (BID) | ORAL | Status: DC
  Administered 2014-07-19: 10 mg via ORAL
  Filled 2014-07-19 (×2): qty 1

## 2014-07-19 MED ORDER — METFORMIN HCL 500 MG PO TABS: 500.0000 mg | ORAL_TABLET | Freq: Two times a day (BID) | ORAL | Status: DC

## 2014-07-19 MED ORDER — GLIPIZIDE ER 5 MG PO TB24: 10.0000 mg | ORAL_TABLET | Freq: Every day | ORAL | Status: DC

## 2014-07-19 MED ORDER — METFORMIN HCL 500 MG PO TABS
500.0000 mg | ORAL_TABLET | Freq: Two times a day (BID) | ORAL | Status: DC
  Administered 2014-07-19: 500 mg via ORAL
  Filled 2014-07-19 (×2): qty 1

## 2014-07-19 MED ORDER — CIPROFLOXACIN HCL 250 MG PO TABS
250.0000 mg | ORAL_TABLET | Freq: Two times a day (BID) | ORAL | Status: DC
  Administered 2014-07-19: 250 mg via ORAL
  Filled 2014-07-19 (×3): qty 1

## 2014-07-19 NOTE — Progress Notes (Signed)
Inpatient Diabetes Program Recommendations  AACE/ADA: New Consensus Statement on Inpatient Glycemic Control (2013)  Target Ranges:  Prepandial:   less than 140 mg/dL      Peak postprandial:   less than 180 mg/dL (1-2 hours)      Critically ill patients:  140 - 180 mg/dL   Reason for Visit: Hyperglycemia  Diabetes history: DM2 Outpatient Diabetes medications: Glipizide 5 mg QAM Current orders for Inpatient glycemic control: Glipizide 10 mg QAM, Novolog moderate tidwc and hs  Results for HURBERT, DURAN (MRN [id number]) as of 07/19/2014 12:42  Ref. Range 07/18/2014 13:21 07/18/2014 17:06 07/18/2014 21:43 07/19/2014 07:41 07/19/2014 11:36  Glucose-Capillary Latest Ref Range: 70-99 mg/dL 291 (H) 345 (H) 221 (H) 266 (H) 261 (H)  Results for MADDON, HORTON (MRN [id number]) as of 07/19/2014 12:42  Ref. Range 07/18/2014 13:38  Hemoglobin A1C Latest Ref Range: 4.8-5.6 % 12.1 (H)   HgbA1C indicates poor glycemic control prior to admission. Will likely need to go home on insulin. Will talk with pt this afternoon regarding HgbA1C. Needs basal insulin.  Recommendations: Add basal insulin - Levemir 20 units QHS Add Novolog 4 units tidwc for meal coverage insulin.  *MD - will order Insulin Starter Kit and begin teaching insulin administration if pt is to be discharged on insulin.  Thank you. Lorenda Peck, RD, LDN, CDE Inpatient Diabetes Coordinator [phone]

## 2014-07-19 NOTE — Progress Notes (Signed)
UR completed 

## 2014-07-19 NOTE — Progress Notes (Signed)
Patient given discharge instructions, and verbalized an understanding of all discharge instructions.  Patient agrees with discharge plan, and is being discharged in stable medical condition.  Patient assisted to transportation.

## 2014-07-19 NOTE — Progress Notes (Signed)
CARE MANAGEMENT NOTE 07/19/2014  Patient:  Roberto [name],Roberto [name]   Account Number:  [id number]  Date Initiated:  07/19/2014  Documentation initiated by:  Edwyna Shell  Subjective/Objective Assessment:   54 yo male admitted with hypertensive urgency     Action/Plan:   discharge planning   Anticipated DC Date:  07/20/2014   Anticipated DC Plan:  [place]  CM consult      Choice offered to / List presented to:             Status of service:  Completed, signed off Medicare Important Message given?   (If response is "NO", the following Medicare IM given date fields will be blank) Date Medicare IM given:   Medicare IM given by:   Date Additional Medicare IM given:   Additional Medicare IM given by:    Discharge Disposition:  HOME/SELF CARE  Per UR Regulation:    If discussed at Long Length of Stay Meetings, dates discussed:    Comments:  07/19/14 Charise Killian RN BSN CM [phone] Patient stated that he has a PCP in [place]. 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 His PCP is [name].

## 2014-07-19 NOTE — Progress Notes (Signed)
  Echocardiogram 2D Echocardiogram has been performed.  Roberto [name] 07/19/2014, 10:18 AM

## 2014-07-19 NOTE — Discharge Summary (Signed)
Physician Discharge Summary  Roberto [name] [id number] DOB: [birth year] DOA: 07/18/2014  PCP: No primary care provider on file. [name] in [place] date: 07/18/2014 Discharge date: 07/19/2014  Time spent: Greater than 30 minutes  Recommendations for Outpatient Follow-up:  1. Dr. Elayne Snare, Endocrinology: to be seen in 5-7 days. MD's office will arrange appointment. 2. Dr. Earnie Larsson, Neurosurgery in 1 week 3. PCP   Discharge Diagnoses:  Active Problems:   Hypertensive urgency   DM (diabetes mellitus), type 2, uncontrolled   Hypertensive crisis   Discharge Condition: Improved & Stable  Diet recommendation: Heart Healthy & Diabetic diet.  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 His primary residence is in [place] but due to his travel and convenience, his PCP is in [place]. He has PMH of poorly controlled DM 2 (previous A1C apparently in 7-8 range) and HTN. On day of admission to the hospital, he was getting ready to travel/fly to [place] on business and started experiencing headache and nausea. He complained of weakness and ?DOE. He checked his BP which read 240/127 and took an extra dose of Imdur 60 mg. Symptoms did not improve and he presented to the ED where initial BP was 190/102 and he was briefly started on a Cardene drip. BP improved to 165/91 and Cardene was discontinued in the ED. CT Head showed mass in the sellar/suprasellar region. Hospitalist admission was requested.   Hospital Course:   Hypertensive Urgency - It appears that he may have poorly controlled HTN as OP as evidenced by LVH on echo. - Briefly place on Cardene drip in ED - Resumed on his home medications: Toprol XL 50 mg daily, Amlodipine 10 mg daily & Imdur 60 mg daily - BP's continued to be elevated and Lisinopril 10 mg BID was initiated which  would also be nephro protective given his h/o DM - He is advised to have his BMP recheck in 1-2 weeks to ensure stability of renal function. - His antihypertensives will need adjustment as OP. - He is counseled regarding weight loss and low salt diet. - If BP is resistant to control, may consider evaluation for secondary causes. - TSH normal: 0.404 - AM cortisol: 8.4  - DOE on admission was probably related to uncontrolled HTN. Trace ankle edema but no features of overt CHF (BNP 87). 2 D Echo showed LVH. Patient denied chest pain. Dyspnea resolved.   Pituitary Macroadenoma/Hyperprolactinemia/Prolactinoma - CT Head showed sellar/supprasellar mass (detailed report as below) - This was followed up with MRI/MRA brain which confirmed Pituitary macroadenoma with local mass effect (detailed report as below). - Patient denied h/o chronic headaches and headache that he presented with resolved shortly after admission. He denied visual symptoms, gynecomastia (states that he always has some breast enlargement which was unchanged), gallactorhea, erectile dysfunction or impotence. - Cortisol: 8.4 - TSH: 0.404, FT4: 1.01 - GH: 0.3 - LH: 1.6 - FSH: 1.5 - Prolactin level: 1312 - Discussed with Neurosurgeon Dr. Earnie Larsson on 07/19/14: he did not see a role for urgent surgery at this time, recommended Endocrinology consultation and medications alone may help shrink the tumor and surgery may not be needed and OP follow up with him in 1 week. - Discussed with Dr. Elayne Snare, Endocrinology on 07/19/2014: He preferred to see him in his office in early consultation and advised  to hold of on initiating any medications until office visit with him. His office would call patient with appointment. - Testosterone not checked: can be performed OP - Arranged CD with imaging studies as per patient request so he may seek 2nd opinion as needed.  Uncontrolled Type 2 Diabetes Mellitus - Glucotrol dose was increased from 5 mg to  10 mg daily. - Discussed option of starting Insulins with Dr. Dwyane Dee who recommended starting Metformin and OP follow up with him re further Mx. - HbA1C: 12.1 - Patient counseled re compliance with diet, medications, exercise, weight loss.  Presumed UTI - Urine microscopy positive for significant pyuria and bacteruria. - Patient had urinary frequency which may be due to his hyperglycemia too. He had no fever or chills. He had some intermittent LBP.  - Counseled to hold off Abx pending urine culture results follow up as OP. But patient insisted on initiating abx for UTI and hence was started on 3 days of PO Cipro for uncomplicated UTI. - Urine Culture was requested and patient reminded to provide sample but appears not to be sent.  Body mass index is 36.61 kg/(m^2). /Morbid Obesity - will need diet, exercise and weight loss. Counseled.   Consultations:  Discussed with Neurosurgery/Dr. Earnie Larsson   Discussed with Endocrinology/Dr. Elayne Snare.  Procedures:  2 D Echo 07/19/14:  Study Conclusions  - Left ventricle: The cavity size was normal. Wall thickness was increased in a pattern of moderate LVH. Systolic function was normal. The estimated ejection fraction was in the range of 60% to 65%. Wall motion was normal; there were no regional wall motion abnormalities. Doppler parameters are consistent with abnormal left ventricular relaxation (grade 1 diastolic dysfunction). - Left atrium: The atrium was mildly dilated. - Right atrium: The atrium was mildly dilated.   Discharge Exam:  Complaints:  Denied complaints. Headache, nausea and DOE resolved. No chest pain reported. Denied visual symptoms, breast enlargement, erectile dysfunction.  Filed Vitals:   07/19/14 0353 07/19/14 0552 07/19/14 1034 07/19/14 1401  BP: 185/104 173/89 160/110 150/94  Pulse: 77 77    Temp:  98.4 F (36.9 C)    TempSrc:  Oral    Resp:  18    Height:      Weight:      SpO2:  97%       General exam: Moderately built and obese male lying comfortably supine in bed. Respiratory system: Clear. No increased work of breathing. Cardiovascular system: S1 & S2 heard, RRR. No JVD, murmurs, gallops, clicks. Trace ankle edema.Tele: SR. Gastrointestinal system: Abdomen is nondistended, soft and nontender. Normal bowel sounds heard. Central nervous system: Alert and oriented. No focal neurological deficits. Extremities: Symmetric 5 x 5 power. HEENT: PERTLA, EOM movements intact.   Discharge Instructions      Discharge Instructions    Activity as tolerated - No restrictions    Complete by:  As directed      Call MD for:  extreme fatigue    Complete by:  As directed      Call MD for:  persistant dizziness or light-headedness    Complete by:  As directed      Call MD for:  persistant nausea and vomiting    Complete by:  As directed      Call MD for:  severe uncontrolled pain    Complete by:  As directed      Diet - low sodium heart healthy    Complete by:  As directed  Diet Carb Modified    Complete by:  As directed             Medication List    TAKE these medications        amLODipine 10 MG tablet  Commonly known as:  NORVASC  Take 10 mg by mouth daily.     aspirin EC 81 MG tablet  Take 81 mg by mouth daily.     ciprofloxacin 250 MG tablet  Commonly known as:  CIPRO  Take 1 tablet (250 mg total) by mouth 2 (two) times daily.     glipiZIDE 5 MG 24 hr tablet  Commonly known as:  GLUCOTROL XL  Take 2 tablets (10 mg total) by mouth daily with breakfast.     isosorbide mononitrate 60 MG 24 hr tablet  Commonly known as:  IMDUR  Take 60 mg by mouth daily.     lisinopril 10 MG tablet  Commonly known as:  PRINIVIL,ZESTRIL  Take 1 tablet (10 mg total) by mouth 2 (two) times daily.     metFORMIN 500 MG tablet  Commonly known as:  GLUCOPHAGE  Take 1 tablet (500 mg total) by mouth 2 (two) times daily with a meal.     metoprolol succinate 50 MG 24 hr  tablet  Commonly known as:  TOPROL-XL  Take 50 mg by mouth daily. Take with or immediately following a meal.       Follow-up Information    Follow up with POOL,HENRY A, MD. Schedule an appointment as soon as possible for a visit in 1 week.   Specialty:  Neurosurgery   Why:  Appointment with Dr. Annette Stable on 07/27/14 at 9:30 a.m. Call [phone] to reschedule if needed.   Contact information:   [address] [place] [address] [place] [address] [phone]       Follow up with Elayne Snare, MD.   Specialty:  Endocrinology   Why:  Dr. Ronnie Derby office will call and inform you of your appointment time and date. Please call them if you don't hear from them in 2-3 days.   Contact information:   [address] [place] [address] [phone]       Schedule an appointment as soon as possible for a visit with Primary Medical Doctor.       The results of significant diagnostics from this hospitalization (including imaging, microbiology, ancillary and laboratory) are listed below for reference.    Significant Diagnostic Studies: Ct Head Wo Contrast  07/18/2014   CLINICAL DATA:  Nausea and headache.  Hypertension.  EXAM: CT HEAD WITHOUT CONTRAST  TECHNIQUE: Contiguous axial images were obtained from the base of the skull through the vertex without intravenous contrast.  COMPARISON:  None.  FINDINGS: 1.4 x 1.7 cm hyper attenuating mass within the sella with suprasellar extension. Associated osseous remodeling. Maintained gray-white differentiation. No hydrocephalus. No intraparenchymal hemorrhage. No abnormal extra-axial fluid collection. No CT evidence of an acute infarction. The visualized paranasal sinuses and mastoid air cells are predominantly clear.  IMPRESSION: 1.4 x 1.7 cm hyper attenuating mass within the sella/suprasellar. Differential includes Rathke's cleft cyst, pituitary adenoma, or aneurysm. Recommend brain MRI.   Electronically Signed   By: Carlos Levering M.D.    On: 07/18/2014 07:11   Mr Virgel Paling Wo Contrast  07/18/2014   CLINICAL DATA:  54 year old diabetic hypertensive male with nausea and headache. Abnormal CT. Subsequent encounter.  EXAM: MRI HEAD WITHOUT CONTRAST  MRA HEAD WITHOUT CONTRAST  TECHNIQUE: Multiplanar,  multiecho pulse sequences of the brain and surrounding structures were obtained without intravenous contrast. Angiographic images of the head were obtained using MRA technique without contrast.  COMPARISON:  07/18/2014 CT.  FINDINGS: MRI HEAD FINDINGS  2.3 x 2 x 1.8 cm mass centered in the sella with suprasellar extension and cavernous sinus extension greater on the left have an appearance most suggestive of pituitary macroadenoma. This impresses upon the optic chiasm, optic nerves, optic tracts and infundibular recess of the third ventricle. This is immediately anterior to the mamillary bodies.  No other intracranial enhancing lesion.  No acute infarct.  Minimal nonspecific white matter changes.  No intracranial hemorrhage.  Minimal partial opacification mastoid air cells greater on left without obstructing lesion of the eustachian tube. Minimal mucosal thickening ethmoid sinus air cells with mild mucosal thickening maxillary sinuses.  Minimal exophthalmos.  Cervical medullary junction and pineal region unremarkable.  MRA HEAD FINDINGS  The pituitary mass partially surrounds the carotid arteries more notable on the left. Mild narrowing left internal carotid artery cavernous segment. Right internal carotid artery ectasia with minimal bulge medial aspect but without saccular aneurysm.  Tiny bulge undersurface proximal M1 segment left middle cerebral artery appears to be origin of a vessel on source images rather than representing an aneurysm.  Suprasellar extension a pituitary mass insinuating between the internal carotid arteries and basilar artery. The left posterior communicating artery is draped along the left lateral margin.  No significant stenosis  of the distal vertebral arteries or basilar artery.  Full extent of the posterior inferior cerebellar artery is not imaged.  IMPRESSION: MRI HEAD  Pituitary macroadenoma with suprasellar extension associated mass effect suspected as detailed above.  Minimal partial opacification mastoid air cells greater on left. Minimal mucosal thickening ethmoid sinus air cells with mild mucosal thickening maxillary sinuses.  MRA HEAD  The pituitary mass partially surrounds the carotid arteries more notable on the left. Mild narrowing left internal carotid artery cavernous segment. Right internal carotid artery ectasia with minimal bulge medial aspect but without saccular aneurysm.  Suprasellar extension a pituitary mass insinuating between the internal carotid arteries and basilar artery. The left posterior communicating artery is draped along the left lateral margin of the pituitary macroadenoma.   Electronically Signed   By: Genia Del M.D.   On: 07/18/2014 14:08   Mr Roberto [name] [id number] Contrast  07/18/2014   CLINICAL DATA:  54 year old diabetic hypertensive male with nausea and headache. Abnormal CT. Subsequent encounter.  EXAM: MRI HEAD WITHOUT CONTRAST  MRA HEAD WITHOUT CONTRAST  TECHNIQUE: Multiplanar, multiecho pulse sequences of the brain and surrounding structures were obtained without intravenous contrast. Angiographic images of the head were obtained using MRA technique without contrast.  COMPARISON:  07/18/2014 CT.  FINDINGS: MRI HEAD FINDINGS  2.3 x 2 x 1.8 cm mass centered in the sella with suprasellar extension and cavernous sinus extension greater on the left have an appearance most suggestive of pituitary macroadenoma. This impresses upon the optic chiasm, optic nerves, optic tracts and infundibular recess of the third ventricle. This is immediately anterior to the mamillary bodies.  No other intracranial enhancing lesion.  No acute infarct.  Minimal nonspecific white matter changes.  No intracranial hemorrhage.   Minimal partial opacification mastoid air cells greater on left without obstructing lesion of the eustachian tube. Minimal mucosal thickening ethmoid sinus air cells with mild mucosal thickening maxillary sinuses.  Minimal exophthalmos.  Cervical medullary junction and pineal region unremarkable.  MRA HEAD FINDINGS  The pituitary mass partially  surrounds the carotid arteries more notable on the left. Mild narrowing left internal carotid artery cavernous segment. Right internal carotid artery ectasia with minimal bulge medial aspect but without saccular aneurysm.  Tiny bulge undersurface proximal M1 segment left middle cerebral artery appears to be origin of a vessel on source images rather than representing an aneurysm.  Suprasellar extension a pituitary mass insinuating between the internal carotid arteries and basilar artery. The left posterior communicating artery is draped along the left lateral margin.  No significant stenosis of the distal vertebral arteries or basilar artery.  Full extent of the posterior inferior cerebellar artery is not imaged.  IMPRESSION: MRI HEAD  Pituitary macroadenoma with suprasellar extension associated mass effect suspected as detailed above.  Minimal partial opacification mastoid air cells greater on left. Minimal mucosal thickening ethmoid sinus air cells with mild mucosal thickening maxillary sinuses.  MRA HEAD  The pituitary mass partially surrounds the carotid arteries more notable on the left. Mild narrowing left internal carotid artery cavernous segment. Right internal carotid artery ectasia with minimal bulge medial aspect but without saccular aneurysm.  Suprasellar extension a pituitary mass insinuating between the internal carotid arteries and basilar artery. The left posterior communicating artery is draped along the left lateral margin of the pituitary macroadenoma.   Electronically Signed   By: Genia Del M.D.   On: 07/18/2014 14:08   Dg Chest Port 1  View  07/18/2014   CLINICAL DATA:  Short of breath.  EXAM: PORTABLE CHEST - 1 VIEW  COMPARISON:  None.  FINDINGS: Mediastinum and hilar structures normal. Low lung volumes with mild basilar atelectasis. No pleural effusion or pneumothorax. Heart size normal. No acute bony abnormality.  IMPRESSION: Low lung volumes with mild basilar atelectasis. Exam otherwise unremarkable.   Electronically Signed   By: Marcello Moores  Register   On: 07/18/2014 07:03    Microbiology: No results found for this or any previous visit (from the past 240 hour(s)).   Labs: Basic Metabolic Panel:  Recent Labs Lab 07/18/14 0325 07/18/14 1338 07/19/14 0545  NA 136 136 136  K 3.8 4.5 3.7  CL 99 97 99  CO2 28 29 29   GLUCOSE 380* 324* 268*  BUN 16 16 16   CREATININE 1.00 1.13 1.02  CALCIUM 9.3 9.1 9.0   Liver Function Tests:  Recent Labs Lab 07/18/14 0325  AST 19  ALT 19  ALKPHOS 105  BILITOT 0.5  PROT 7.8  ALBUMIN 3.7   No results for input(s): LIPASE, AMYLASE in the last 168 hours. No results for input(s): AMMONIA in the last 168 hours. CBC:  Recent Labs Lab 07/18/14 0325 07/19/14 0545  WBC 4.8 5.3  NEUTROABS 3.3  --   HGB 11.9* 12.1*  HCT 37.1* 38.0*  MCV 89.0 91.1  PLT 230 199   Cardiac Enzymes:  Recent Labs Lab 07/18/14 0325  TROPONINI <0.03   BNP: BNP (last 3 results)  Recent Labs  07/18/14 0326  BNP 87.3    ProBNP (last 3 results) No results for input(s): PROBNP in the last 8760 hours.  CBG:  Recent Labs Lab 07/18/14 1321 07/18/14 1706 07/18/14 2143 07/19/14 0741 07/19/14 1136  GLUCAP 291* 345* 221* 266* 261*       Signed:  Vernell Leep, MD, FACP, FHM. [facility] Pager [phone]  If 7PM-7AM, please contact night-coverage [email or web address] Password [email or web address] 07/19/2014, 3:23 PM

## 2014-07-19 NOTE — Discharge Instructions (Signed)
Pituitary Tumors Pituitary tumors are abnormal growths found in the pituitary gland. The pituitary gland is a small organ--about the size of a dime--located in the center of the brain. It makes hormones that affect growth and the functions of other glands in the body. Most pituitary tumors are benign. This means they are noncancerous. They grow slowly and do not spread to other parts of the body. A pituitary tumor may make the pituitary gland produce too many hormones. Tumors that make hormones are called functioning tumors (those that do not make hormones are called nonfunctioning tumors). Problems that can be caused by pituitary tumors include:  Cushing disease. This disease causes fat to build up in the face, back, and chest while the arms and legs become thin.  Acromegaly. This is a condition in which the hands, feet, and face are larger than normal.  Breast milk production even though there is no pregnancy. CAUSES  The cause of most pituitary tumors is not known. In some cases, these kinds of tumors run in a family. RISK FACTORS Some cases of pituitary tumors are due to genetic factors that a person inherits that increase the likelihood of developing certain tumors, including pituitary tumors. SIGNS AND SYMPTOMS   Headaches.   Vision problems.   Weakness or low energy.  Clear fluid draining from the nose.  Changes in the sense of smell.  Feeling sick to your stomach (nauseous) and vomiting.   Problems caused by the production of too many hormones, such as:   Infertility.  Loss of menstrual periods in women.   Abnormal growth.   High blood pressure (hypertension).   Heat or cold intolerance.   Other skin and body changes.   Nipple discharge.  Decreased sexual function. DIAGNOSIS  If you develop symptoms, you will be sent for a CT scan or MRI to look for pituitary tumors. If you know that these kinds of tumors run in your family, you may need to have your  blood tested regularly to monitor pituitary hormone levels. TREATMENT  These tumors are best treated when they are found and diagnosed early. Treatments include:   Surgical removal of the tumor. This is the most common treatment.  Radiation therapy. During this treatment, high doses of X-rays are used to kill tumor cells.  Drug therapy. This involves using certain medicines to block the pituitary gland from producing too many hormones. HOME CARE INSTRUCTIONS  Drink plenty of fluids.  Measure your urine output if directed to do so by your health care provider.  Do not pick your nose or remove any crusting.  Do not do any activities that require straining.  Take all medicines as directed by your health care provider.  Keep follow-up appointments as directed by your health care provider. SEEK MEDICAL CARE IF:  You have sudden, unusual thirst.  You are urinating frequently.  You have a headache that will not go away.  You have new vision changes.  You notice clear fluid leaking from your nose or ears, a sensation of fluid trickling down the back of your throat, or a salty taste in your mouth.  You are having trouble concentrating. SEEK IMMEDIATE MEDICAL CARE IF:  Your symptoms suddenly become severe.  You have a nosebleed that does not stop after a few minutes.  You have a fever over 101F (38.3C).  You have a severe headache or a stiff neck.  You are confused or not as alert as usual.  You have chest pain or shortness of  breath. Document Released: 03/07/2002 Document Revised: 01/05/2013 Document Reviewed: 09/17/2012 [facility] Patient Information 2015 [place], [place]. This information is not intended to replace advice given to you by your health care provider. Make sure you discuss any questions you have with your health care provider.  Type 2 Diabetes Mellitus Type 2 diabetes mellitus, often simply referred to as type 2 diabetes, is a long-lasting (chronic) disease.  In type 2 diabetes, the pancreas does not make enough insulin (a hormone), the cells are less responsive to the insulin that is made (insulin resistance), or both. Normally, insulin moves sugars from food into the tissue cells. The tissue cells use the sugars for energy. The lack of insulin or the lack of normal response to insulin causes excess sugars to build up in the blood instead of going into the tissue cells. As a result, high blood sugar (hyperglycemia) develops. The effect of high sugar (glucose) levels can cause many complications. Type 2 diabetes was also previously called adult-onset diabetes, but it can occur at any age.  RISK FACTORS  A person is predisposed to developing type 2 diabetes if someone in the family has the disease and also has one or more of the following primary risk factors:  Overweight.  An inactive lifestyle.  A history of consistently eating high-calorie foods. Maintaining a normal weight and regular physical activity can reduce the chance of developing type 2 diabetes. SYMPTOMS  A person with type 2 diabetes may not show symptoms initially. The symptoms of type 2 diabetes appear slowly. The symptoms include:  Increased thirst (polydipsia).  Increased urination (polyuria).  Increased urination during the night (nocturia).  Weight loss. This weight loss may be rapid.  Frequent, recurring infections.  Tiredness (fatigue).  Weakness.  Vision changes, such as blurred vision.  Fruity smell to your breath.  Abdominal pain.  Nausea or vomiting.  Cuts or bruises which are slow to heal.  Tingling or numbness in the hands or feet. DIAGNOSIS Type 2 diabetes is frequently not diagnosed until complications of diabetes are present. Type 2 diabetes is diagnosed when symptoms or complications are present and when blood glucose levels are increased. Your blood glucose level may be checked by one or more of the following blood tests:  A fasting blood  glucose test. You will not be allowed to eat for at least 8 hours before a blood sample is taken.  A random blood glucose test. Your blood glucose is checked at any time of the day regardless of when you ate.  A hemoglobin A1c blood glucose test. A hemoglobin A1c test provides information about blood glucose control over the previous 3 months.  An oral glucose tolerance test (OGTT). Your blood glucose is measured after you have not eaten (fasted) for 2 hours and then after you drink a glucose-containing beverage. TREATMENT   You may need to take insulin or diabetes medicine daily to keep blood glucose levels in the desired range.  If you use insulin, you may need to adjust the dosage depending on the carbohydrates that you eat with each meal or snack. The treatment goal is to maintain the before meal blood sugar (preprandial glucose) level at 70-130 mg/dL. HOME CARE INSTRUCTIONS   Have your hemoglobin A1c level checked twice a year.  Perform daily blood glucose monitoring as directed by your health care provider.  Monitor urine ketones when you are ill and as directed by your health care provider.  Take your diabetes medicine or insulin as directed by your health  care provider to maintain your blood glucose levels in the desired range.  Never run out of diabetes medicine or insulin. It is needed every day.  If you are using insulin, you may need to adjust the amount of insulin given based on your intake of carbohydrates. Carbohydrates can raise blood glucose levels but need to be included in your diet. Carbohydrates provide vitamins, minerals, and fiber which are an essential part of a healthy diet. Carbohydrates are found in fruits, vegetables, whole grains, dairy products, legumes, and foods containing added sugars.  Eat healthy foods. You should make an appointment to see a registered dietitian to help you create an eating plan that is right for you.  Lose weight if you are  overweight.  Carry a medical alert card or wear your medical alert jewelry.  Carry a 15-gram carbohydrate snack with you at all times to treat low blood glucose (hypoglycemia). Some examples of 15-gram carbohydrate snacks include:  Glucose tablets, 3 or 4.  Glucose gel, 15-gram tube.  Raisins, 2 tablespoons (24 grams).  Jelly beans, 6.  Animal crackers, 8.  Regular pop, 4 ounces (120 mL).  Gummy treats, 9.  Recognize hypoglycemia. Hypoglycemia occurs with blood glucose levels of 70 mg/dL and below. The risk for hypoglycemia increases when fasting or skipping meals, during or after intense exercise, and during sleep. Hypoglycemia symptoms can include:  Tremors or shakes.  Decreased ability to concentrate.  Sweating.  Increased heart rate.  Headache.  Dry mouth.  Hunger.  Irritability.  Anxiety.  Restless sleep.  Altered speech or coordination.  Confusion.  Treat hypoglycemia promptly. If you are alert and able to safely swallow, follow the 15:15 rule:  Take 15-20 grams of rapid-acting glucose or carbohydrate. Rapid-acting options include glucose gel, glucose tablets, or 4 ounces (120 mL) of fruit juice, regular soda, or low-fat milk.  Check your blood glucose level 15 minutes after taking the glucose.  Take 15-20 grams more of glucose if the repeat blood glucose level is still 70 mg/dL or below.  Eat a meal or snack within 1 hour once blood glucose levels return to normal.  Be alert to feeling very thirsty and urinating more frequently than usual, which are early signs of hyperglycemia. An early awareness of hyperglycemia allows for prompt treatment. Treat hyperglycemia as directed by your health care provider.  Engage in at least 150 minutes of moderate-intensity physical activity a week, spread over at least 3 days of the week or as directed by your health care provider. In addition, you should engage in resistance exercise at least 2 times a week or as  directed by your health care provider. Try to spend no more than 90 minutes at one time inactive.  Adjust your medicine and food intake as needed if you start a new exercise or sport.  Follow your sick-day plan anytime you are unable to eat or drink as usual.  Do not use any tobacco products including cigarettes, chewing tobacco, or electronic cigarettes. If you need help quitting, ask your health care provider.  Limit alcohol intake to no more than 1 drink per day for nonpregnant women and 2 drinks per day for men. You should drink alcohol only when you are also eating food. Talk with your health care provider whether alcohol is safe for you. Tell your health care provider if you drink alcohol several times a week.  Keep all follow-up visits as directed by your health care provider. This is important.  Schedule an eye exam  soon after the diagnosis of type 2 diabetes and then annually.  Perform daily skin and foot care. Examine your skin and feet daily for cuts, bruises, redness, nail problems, bleeding, blisters, or sores. A foot exam by a health care provider should be done annually.  Brush your teeth and gums at least twice a day and floss at least once a day. Follow up with your dentist regularly.  Share your diabetes management plan with your workplace or school.  Stay up-to-date with immunizations. It is recommended that people with diabetes who are over 44 years old get the pneumonia vaccine. In some cases, two separate shots may be given. Ask your health care provider if your pneumonia vaccination is up-to-date.  Learn to manage stress.  Obtain ongoing diabetes education and support as needed.  Participate in or seek rehabilitation as needed to maintain or improve independence and quality of life. Request a physical or occupational therapy referral if you are having foot or hand numbness, or difficulties with grooming, dressing, eating, or physical activity. SEEK MEDICAL CARE IF:     You are unable to eat food or drink fluids for more than 6 hours.  You have nausea and vomiting for more than 6 hours.  Your blood glucose level is over 240 mg/dL.  There is a change in mental status.  You develop an additional serious illness.  You have diarrhea for more than 6 hours.  You have been sick or have had a fever for a couple of days and are not getting better.  You have pain during any physical activity.  SEEK IMMEDIATE MEDICAL CARE IF:  You have difficulty breathing.  You have moderate to large ketone levels. MAKE SURE YOU:  Understand these instructions.  Will watch your condition.  Will get help right away if you are not doing well or get worse. Document Released: 03/17/2005 Document Revised: 08/01/2013 Document Reviewed: 10/14/2011 [facility] Patient Information 2015 [place], [place]. This information is not intended to replace advice given to you by your health care provider. Make sure you discuss any questions you have with your health care provider.  Hypertension Hypertension, commonly called high blood pressure, is when the force of blood pumping through your arteries is too strong. Your arteries are the blood vessels that carry blood from your heart throughout your body. A blood pressure reading consists of a higher number over a lower number, such as 110/72. The higher number (systolic) is the pressure inside your arteries when your heart pumps. The lower number (diastolic) is the pressure inside your arteries when your heart relaxes. Ideally you want your blood pressure below 120/80. Hypertension forces your heart to work harder to pump blood. Your arteries may become narrow or stiff. Having hypertension puts you at risk for heart disease, stroke, and other problems.  RISK FACTORS Some risk factors for high blood pressure are controllable. Others are not.  Risk factors you cannot control include:   Race. You may be at higher risk if you are African  American.  Age. Risk increases with age.  Gender. Men are at higher risk than women before age 58 years. After age 49, women are at higher risk than men. Risk factors you can control include:  Not getting enough exercise or physical activity.  Being overweight.  Getting too much fat, sugar, calories, or salt in your diet.  Drinking too much alcohol. SIGNS AND SYMPTOMS Hypertension does not usually cause signs or symptoms. Extremely high blood pressure (hypertensive crisis) may cause headache,  anxiety, shortness of breath, and nosebleed. DIAGNOSIS  To check if you have hypertension, your health care provider will measure your blood pressure while you are seated, with your arm held at the level of your heart. It should be measured at least twice using the same arm. Certain conditions can cause a difference in blood pressure between your right and left arms. A blood pressure reading that is higher than normal on one occasion does not mean that you need treatment. If one blood pressure reading is high, ask your health care provider about having it checked again. TREATMENT  Treating high blood pressure includes making lifestyle changes and possibly taking medicine. Living a healthy lifestyle can help lower high blood pressure. You may need to change some of your habits. Lifestyle changes may include:  Following the DASH diet. This diet is high in fruits, vegetables, and whole grains. It is low in salt, red meat, and added sugars.  Getting at least 2 hours of brisk physical activity every week.  Losing weight if necessary.  Not smoking.  Limiting alcoholic beverages.  Learning ways to reduce stress. If lifestyle changes are not enough to get your blood pressure under control, your health care provider may prescribe medicine. You may need to take more than one. Work closely with your health care provider to understand the risks and benefits. HOME CARE INSTRUCTIONS  Have your blood  pressure rechecked as directed by your health care provider.   Take medicines only as directed by your health care provider. Follow the directions carefully. Blood pressure medicines must be taken as prescribed. The medicine does not work as well when you skip doses. Skipping doses also puts you at risk for problems.   Do not smoke.   Monitor your blood pressure at home as directed by your health care provider. SEEK MEDICAL CARE IF:   You think you are having a reaction to medicines taken.  You have recurrent headaches or feel dizzy.  You have swelling in your ankles.  You have trouble with your vision. SEEK IMMEDIATE MEDICAL CARE IF:  You develop a severe headache or confusion.  You have unusual weakness, numbness, or feel faint.  You have severe chest or abdominal pain.  You vomit repeatedly.  You have trouble breathing. MAKE SURE YOU:   Understand these instructions.  Will watch your condition.  Will get help right away if you are not doing well or get worse. Document Released: 03/17/2005 Document Revised: 08/01/2013 Document Reviewed: 01/07/2013 [facility] Patient Information 2015 [place], [place]. This information is not intended to replace advice given to you by your health care provider. Make sure you discuss any questions you have with your health care provider.

## 2014-07-22 LAB — T3, FREE: T3 FREE: 2.4 pg/mL (ref 2.0–4.4)

## 2014-07-24 ENCOUNTER — Telehealth: Payer: Self-pay | Admitting: Endocrinology

## 2014-07-24 NOTE — Telephone Encounter (Signed)
Upon contacting the pt to schedule him for his appt. Dr. Dwyane Dee offered to see him on 07/25/14 at 1230 which was not appropriate for the patient. The pt requested to be seen on 08/04/14 and that is the day we worked him in.

## 2014-08-04 ENCOUNTER — Encounter: Payer: Self-pay | Admitting: Endocrinology

## 2014-08-04 ENCOUNTER — Ambulatory Visit: Payer: BLUE CROSS/BLUE SHIELD | Admitting: Endocrinology

## 2014-08-04 ENCOUNTER — Other Ambulatory Visit: Payer: Self-pay | Admitting: *Deleted

## 2014-08-04 ENCOUNTER — Ambulatory Visit (INDEPENDENT_AMBULATORY_CARE_PROVIDER_SITE_OTHER): Payer: BLUE CROSS/BLUE SHIELD | Admitting: Endocrinology

## 2014-08-04 VITALS — BP 171/97 | HR 80 | Temp 98.0°F | Resp 16 | Ht 72.0 in | Wt 224.0 lb

## 2014-08-04 DIAGNOSIS — E1165 Type 2 diabetes mellitus with hyperglycemia: Secondary | ICD-10-CM

## 2014-08-04 DIAGNOSIS — I10 Essential (primary) hypertension: Secondary | ICD-10-CM | POA: Diagnosis not present

## 2014-08-04 DIAGNOSIS — IMO0002 Reserved for concepts with insufficient information to code with codable children: Secondary | ICD-10-CM

## 2014-08-04 DIAGNOSIS — D352 Benign neoplasm of pituitary gland: Secondary | ICD-10-CM

## 2014-08-04 MED ORDER — CABERGOLINE 0.5 MG PO TABS: 0.2500 mg | ORAL_TABLET | ORAL | Status: DC

## 2014-08-04 MED ORDER — METFORMIN HCL ER 500 MG PO TB24: ORAL_TABLET | ORAL | Status: DC

## 2014-08-04 NOTE — Progress Notes (Signed)
Patient ID: Toris Laverdiere, male   DOB: [birth year], 54 y.o.   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  for multiple endocrine problems and was referred as a result of his recent hospitalization  PROBLEM 1: Blurred vision: He has had problems with his vision for about 2 years and has been treated by his ophthalmologist for macular edema He thinks that this is mostly central vision and does not have much trouble with peripheral vision His vision is about 20/30 in right eye and 20/100 in the left recently and has been generally followed by an ophthalmologist monthly  He was admitted to the hospital with headaches although he had not previously been having headaches and this was apparently related to his high blood pressure However imaging studies showed a pituitary tumor with the following description:  2.3 x 2 x 1.8 cm mass centered in the sella with suprasellar extension and cavernous sinus extension greater on the left with an appearance most suggestive of pituitary macroadenoma. This impresses upon the optic chiasm  PROBLEM 2:  Hyperprolactinemia:  As part of his evaluation for his pituitary tumor in the hospital he had a prolactin level checked and this was markedly increased at 1312 Patient does admit to having had more fatigue especially in the last 6 months although more recently this is better. For a couple of years he has had a tendency to breast development on both sides and this is more noticeable in the last year or so He has had decreased libido for about 6 months also.  Has not consulted any physician for these problems specifically Testosterone level is not available.  He does not complain of any cold intolerance, nausea or lightheadedness or excessive dry skin No recent significant weight change or change in appetite   Lab Results  Component Value Date   TSH 0.404 07/18/2014   FREET4 1.01 07/18/2014   No results found for: TESTOSTERONE  PROBLEM 3:  DIABETES type 2.   He was diagnosed to have this about 2 years ago and has been managed by a primary care physician who he works with. He thinks his blood sugars have been treated only with glipizide ER 5 mg daily He has not taken metformin or other medications previously He thinks his A1c has been checked periodically and has been usually in the 8-12% range, higher recently  He generally does not have any exercise program although is trying to do some walking recently He has not previously followed any specific diet and eats out a lot because of traveling frequently More recently also he has been trying to pay attention to his diet better and is reducing his carbohydrate intake Has not had any diabetes education or nutritional counseling and is desiring this today  On admission to the hospital his glucose was 380 without any evidence of ketosis He was started on metformin 500 mg twice a day in the hospital in addition to his glipizide ER and he thinks his blood sugars are improving with the fasting readings about 140 recently.  Does not check any readings after meals  Does not remember which meter he is using and did not bring it for download today.  Lab Results  Component Value Date   HGBA1C 12.1* 07/18/2014   Lab Results  Component Value Date   CREATININE 1.02 07/19/2014      Past Medical History  Diagnosis Date  .  Diabetes mellitus without complication   . Hypertension     History reviewed. No pertinent past surgical history.  Family History  Problem Relation Age of Onset  . Cancer Other   . Hypertension Other   . Diabetes Mother   . Diabetes Father   . Heart disease Neg Hx     Social History:  reports that he has never smoked. He has never used smokeless tobacco. He reports that he does not drink alcohol or use illicit drugs.  Allergies: No Known Allergies    Medication List         This list is accurate as of: 08/04/14 11:59 PM.  Always use your most recent med list.               amLODipine 10 MG tablet  Commonly known as:  NORVASC  Take 10 mg by mouth daily.     aspirin EC 81 MG tablet  Take 81 mg by mouth daily.     cabergoline 0.5 MG tablet  Commonly known as:  DOSTINEX  Take 0.5 tablets (0.25 mg total) by mouth 2 (two) times a week.     glipiZIDE 5 MG 24 hr tablet  Commonly known as:  GLUCOTROL XL  Take 2 tablets (10 mg total) by mouth daily with breakfast.     isosorbide mononitrate 60 MG 24 hr tablet  Commonly known as:  IMDUR  Take 60 mg by mouth daily.     lisinopril 10 MG tablet  Commonly known as:  PRINIVIL,ZESTRIL  Take 1 tablet (10 mg total) by mouth 2 (two) times daily.     metFORMIN 500 MG 24 hr tablet  Commonly known as:  GLUCOPHAGE XR  Take 4 tablets daily     metoprolol succinate 50 MG 24 hr tablet  Commonly known as:  TOPROL-XL  Take 50 mg by mouth daily. Take with or immediately following a meal.        LABS:  No visits with results within 1 Week(s) from this visit. Latest known visit with results is:  Admission on 07/18/2014, Discharged on 07/19/2014  Component Date Value Ref Range Status  . WBC 07/18/2014 4.8  4.0 - 10.5 K/uL Final  . RBC 07/18/2014 4.17* 4.22 - 5.81 MIL/uL Final  . Hemoglobin 07/18/2014 11.9* 13.0 - 17.0 g/dL Final  . HCT 07/18/2014 37.1* 39.0 - 52.0 % Final  . MCV 07/18/2014 89.0  78.0 - 100.0 fL Final  . MCH 07/18/2014 28.5  26.0 - 34.0 pg Final  . MCHC 07/18/2014 32.1  30.0 - 36.0 g/dL Final  . RDW 07/18/2014 12.7  11.5 - 15.5 % Final  . Platelets 07/18/2014 230  150 - 400 K/uL Final  . Neutrophils Relative % 07/18/2014 69  43 - 77 % Final  . Neutro Abs 07/18/2014 3.3  1.7 - 7.7 K/uL Final  . Lymphocytes Relative 07/18/2014 21  12 - 46 % Final  . Lymphs Abs 07/18/2014 1.0  0.7 - 4.0 K/uL Final  . Monocytes Relative 07/18/2014 8  3 - 12 % Final  . Monocytes Absolute 07/18/2014 0.4  0.1 - 1.0  K/uL Final  . Eosinophils Relative 07/18/2014 2  0 - 5 % Final  . Eosinophils Absolute 07/18/2014 0.1  0.0 - 0.7 K/uL Final  . Basophils Relative 07/18/2014 0  0 - 1 % Final  . Basophils Absolute 07/18/2014 0.0  0.0 - 0.1 K/uL Final  . Sodium 07/18/2014 136  135 - 145 mmol/L Final  . Potassium 07/18/2014  3.8  3.5 - 5.1 mmol/L Final  . Chloride 07/18/2014 99  96 - 112 mmol/L Final  . CO2 07/18/2014 28  19 - 32 mmol/L Final  . Glucose, Bld 07/18/2014 380* 70 - 99 mg/dL Final  . BUN 07/18/2014 16  6 - 23 mg/dL Final  . Creatinine, Ser 07/18/2014 1.00  0.50 - 1.35 mg/dL Final  . Calcium 07/18/2014 9.3  8.4 - 10.5 mg/dL Final  . Total Protein 07/18/2014 7.8  6.0 - 8.3 g/dL Final  . Albumin 07/18/2014 3.7  3.5 - 5.2 g/dL Final  . AST 07/18/2014 19  0 - 37 U/L Final  . ALT 07/18/2014 19  0 - 53 U/L Final  . Alkaline Phosphatase 07/18/2014 105  39 - 117 U/L Final  . Total Bilirubin 07/18/2014 0.5  0.3 - 1.2 mg/dL Final  . GFR calc non Af Amer 07/18/2014 82* >90 mL/min Final  . GFR calc Af Amer 07/18/2014 >90  >90 mL/min Final   Comment: (NOTE) The eGFR has been calculated using the CKD EPI equation. This calculation has not been validated in all clinical situations. eGFR's persistently <90 mL/min signify possible Chronic Kidney Disease.   . Anion gap 07/18/2014 9  5 - 15 Final  . Troponin I 07/18/2014 <0.03  <0.031 ng/mL Final   Comment:        NO INDICATION OF MYOCARDIAL INJURY.   . B Natriuretic Peptide 07/18/2014 87.3  0.0 - 100.0 pg/mL Final  . Sodium 07/18/2014 136  135 - 145 mmol/L Final  . Potassium 07/18/2014 4.5  3.5 - 5.1 mmol/L Final  . Chloride 07/18/2014 97  96 - 112 mmol/L Final  . CO2 07/18/2014 29  19 - 32 mmol/L Final  . Glucose, Bld 07/18/2014 324* 70 - 99 mg/dL Final  . BUN 07/18/2014 16  6 - 23 mg/dL Final  . Creatinine, Ser 07/18/2014 1.13  0.50 - 1.35 mg/dL Final  . Calcium 07/18/2014 9.1  8.4 - 10.5 mg/dL Final  . GFR calc non Af Amer 07/18/2014 71* >90 mL/min  Final  . GFR calc Af Amer 07/18/2014 82* >90 mL/min Final   Comment: (NOTE) The eGFR has been calculated using the CKD EPI equation. This calculation has not been validated in all clinical situations. eGFR's persistently <90 mL/min signify possible Chronic Kidney Disease.   . Anion gap 07/18/2014 10  5 - 15 Final  . Hgb A1c MFr Bld 07/18/2014 12.1* 4.8 - 5.6 % Final   Comment: (NOTE)         Pre-diabetes: 5.7 - 6.4         Diabetes: >6.4         Glycemic control for adults with diabetes: <7.0   . Mean Plasma Glucose 07/18/2014 301   Final   Comment: (NOTE) Performed At: [facility] [place], [place] [id number] Lindon Romp MD MN:[id number]   . Color, Urine 07/18/2014 YELLOW  YELLOW Final  . APPearance 07/18/2014 CLOUDY* CLEAR Final  . Specific Gravity, Urine 07/18/2014 1.026  1.005 - 1.030 Final  . pH 07/18/2014 6.0  5.0 - 8.0 Final  . Glucose, UA 07/18/2014 >1000* NEGATIVE mg/dL Final  . Hgb urine dipstick 07/18/2014 SMALL* NEGATIVE Final  . Bilirubin Urine 07/18/2014 NEGATIVE  NEGATIVE Final  . Ketones, ur 07/18/2014 15* NEGATIVE mg/dL Final  . Protein, ur 07/18/2014 100* NEGATIVE mg/dL Final  . Urobilinogen, UA 07/18/2014 0.2  0.0 - 1.0 mg/dL Final  . Nitrite 07/18/2014 NEGATIVE  NEGATIVE Final  .  Leukocytes, UA 07/18/2014 NEGATIVE  NEGATIVE Final  . Glucose-Capillary 07/18/2014 379* 70 - 99 mg/dL Final  . Glucose-Capillary 07/18/2014 388* 70 - 99 mg/dL Final  . Glucose-Capillary 07/18/2014 291* 70 - 99 mg/dL Final  . TSH 07/18/2014 0.404  0.350 - 4.500 uIU/mL Final  . Prolactin 07/18/2014 1312.0* 4.0 - 15.2 ng/mL Final   Comment: (NOTE) Performed At: [facility] [place], [place] [id number] Lindon Romp MD EH:[id number]   . Free T4 07/18/2014 1.01  0.80 - 1.80 ng/dL Final   Performed at [facility]  . LH 07/18/2014 1.6* 1.7 - 8.6 mIU/mL Final   Comment: (NOTE) Performed At: [facility] [place], [place] [id number] Lindon Romp MD XA:[id number]   . Growth Hormone 07/18/2014 0.3  0.0 - 10.0 ng/mL Final   Comment: (NOTE) Performed At: [facility] [place], [place] [id number] Lindon Romp MD ZM:[id number]   . [facility] 07/18/2014 1.5  1.5 - 12.4 mIU/mL Final   Comment: (NOTE) Performed At: [facility] [place], [place] [id number] Lindon Romp MD EX:[id number]   . Sodium 07/19/2014 136  135 - 145 mmol/L Final  . Potassium 07/19/2014 3.7  3.5 - 5.1 mmol/L Final   Comment: DELTA CHECK NOTED REPEATED TO VERIFY   . Chloride 07/19/2014 99  96 - 112 mmol/L Final  . CO2 07/19/2014 29  19 - 32 mmol/L Final  . Glucose, Bld 07/19/2014 268* 70 - 99 mg/dL Final  . BUN 07/19/2014 16  6 - 23 mg/dL Final  . Creatinine, Ser 07/19/2014 1.02  0.50 - 1.35 mg/dL Final  . Calcium 07/19/2014 9.0  8.4 - 10.5 mg/dL Final  . GFR calc non Af Amer 07/19/2014 80* >90 mL/min Final  . GFR calc Af Amer 07/19/2014 >90  >90 mL/min Final   Comment: (NOTE) The eGFR has been calculated using the CKD EPI equation. This calculation has not been validated in all clinical situations. eGFR's persistently <90 mL/min signify possible Chronic Kidney Disease.   . Anion gap 07/19/2014 8  5 - 15 Final  . WBC 07/19/2014 5.3  4.0 - 10.5 K/uL Final  . RBC 07/19/2014 4.17* 4.22 - 5.81 MIL/uL Final  . Hemoglobin 07/19/2014 12.1* 13.0 - 17.0 g/dL Final  . HCT 07/19/2014 38.0* 39.0 - 52.0 % Final  . MCV 07/19/2014 91.1  78.0 - 100.0 fL Final  . MCH 07/19/2014 29.0  26.0 - 34.0 pg Final  . MCHC 07/19/2014 31.8  30.0 - 36.0 g/dL Final  . RDW 07/19/2014 12.9  11.5 - 15.5 % Final  . Platelets 07/19/2014 199  150 - 400 K/uL Final  . Glucose-Capillary 07/18/2014 345* 70 - 99 mg/dL Final  . Cortisol, Plasma 07/19/2014 8.4   Final   Comment: (NOTE) AM:  4.3 - 22.4 ug/dL PM:  3.1 - 16.7 ug/dL Performed at [facility]   . Squamous Epithelial / LPF  07/18/2014 RARE  RARE Final  . WBC, UA 07/18/2014 TOO NUMEROUS TO COUNT  <3 WBC/hpf Final  . RBC / HPF 07/18/2014 0-2  <3 RBC/hpf Final  . Bacteria, UA 07/18/2014 MANY* RARE Final  . Glucose-Capillary 07/18/2014 221* 70 - 99 mg/dL Final  . Glucose-Capillary 07/19/2014 266* 70 - 99 mg/dL Final  . Comment 1 07/19/2014 Notify RN   Final  . Glucose-Capillary 07/19/2014 261* 70 - 99 mg/dL Final  . Comment 1 07/19/2014 Notify RN   Final  . Glucose-Capillary 07/19/2014 295* 70 -  99 mg/dL Final  . Comment 1 07/19/2014 Notify RN   Final  . T3, Free 07/18/2014 2.4  2.0 - 4.4 pg/mL Final   Comment: (NOTE) Performed At: [facility] [place], [place] [id number] Lindon Romp MD OA:[id number]      REVIEW OF SYSTEMS:        Constitutional: no recent weight gain/loss    Eyes: No known diabetic retinopathy, has had macular edema, see history of present illness  ENT: no difficulty swallowing, no hoarseness   Cardiovascular: no chest pain or tightness on exertion.    He has had a tendency to lower leg swelling on both sides.  He thinks this is better more recently, has had lisinopril added to his Norvasc  He has had a long history of high blood pressure, this was markedly increased at his recent hospitalization.  Previously on Norvasc and metoprolol and lisinopril was started.  Not on diuretics He thinks his blood pressure is being better controlled and he is checking this regularly now and recent readings have been about 130/75 even though it is high in the office No history of problems with hypokalemia or renal dysfunction  Lab Results  Component Value Date   CREATININE 1.02 07/19/2014   BUN 16 07/19/2014   NA 136 07/19/2014   K 3.7 07/19/2014   CL 99 07/19/2014   CO2 29 07/19/2014     Respiratory: no cough/shortness of breath  Gastrointestinal: no constipation, diarrhea, nausea or abdominal pain  Musculoskeletal: no muscle/joint aches  Skin: no  rash  Neurological: no headaches, numbness or tingling in feet or hands  Psychiatric: no depression/anxiety  Endocrine: See history of present illness    Urological:   Had frequency of urination  and  nocturia, better recently with improved blood sugars    PHYSICAL EXAM:  BP 171/97 mmHg  Pulse 80  Temp(Src) 98 F (36.7 C)  Resp 16  Ht 6' (1.829 m)  Wt 224 lb (101.606 kg)  BMI 30.37 kg/m2  SpO2 95%  GENERAL: Mild generalized obesity present.  No cushingoid features  No pallor, clubbing, lymphadenopathy.    EYES:  Externally normal.  Fundii:  Difficult to visualize discs and vessels, this calmed the right side appears normal. Peripheral fields appear normal by confrontation  ENT: Oral mucosa and tongue normal.  THYROID:  Just palpable and soft on the right side.  CAROTIDS:  Normal character; no bruit.  HEART:  Normal  S1 and S2; no murmur or click.  CHEST:  Normal shape.  Lungs: Vescicular breath sounds heard equally.  No crepitations/ wheeze.  ABDOMEN:  No distention.  Liver and spleen not palpable.  No other mass or tenderness.  NEUROLOGICAL: No focal motor weakness. Reflexes are absent bilaterally at ankles. Vibration sense is absent distally on both great toes Monofilament sensation normal on distal toes and plantar surfaces  JOINTS:  Normal.  Extremities: 2+ pedal edema present  Skin:  no rash or pigmentation.  Onychomycosis of toenails present especially on the left great toe   ASSESSMENT:   Macroprolactinoma of the pituitary gland with marked increase in prolactin level of 1315 Also has symptoms of hypogonadism. Judging from his symptoms he had probably started having growth of the pituitary tumor about 2 years ago Discussed with patient that since his prolactin level is significantly high his tumor is likely mostly functional and will likely respond to medical treatment  So far his pituitary function appeared to be relatively normal during evaluation  in  the hospital although IGF-I and testosterone level  not available.  He does have a macroadenoma of the pituitary gland with compression of the optic nerve and visual difficulties although no objective peripheral vision impairment on confrontation today.  This is going to be assessed by his ophthalmologist later this month Also did not have any headaches from the pituitary tumor.  DIABETES with mild obesity and poor control. He has been only on low-dose glipizide and his A1c is recently 12% Although he thinks he is blood sugars are fairly good with adding metformin 500 twice a day to his glipizide he is checking readings only in the morning and most likely needs more aggressive control and also significant changes in lifestyle which he has recently started doing Further management will be dependent on his progress on current regimen and may possibly require triple therapy, he is a good candidate for adding a GLP-1 drug on [employer]ok[employer]   PLAN:   Pituitary tumor: This will need to be treated with Dostinex and the dose to be started at 0.5 mg, half tablet twice a week for the first week at least and then 0.5 mg twice a week if having the side effects of nausea or dizziness  Suggested that he start doing this on Tuesdays and Fridays Potentially may be able to titrate the dose up to 1 mg twice a week to normalize his prolactin levels Will also most likely need to follow pituitary MRI in a few months to assess size of the tumor Also will need to follow testosterone levels to see if they  normalized with treatment of his hyperprolactinemia  Visual fields to be assessed by ophthalmologist later this month and followed closely Does not appear to have any urgent need for pituitary decompression at this time but can keep his appointment with the neurosurgeon  For diabetes:   Start checking blood sugars 2 hours after meals also in addition to fasting.  Discussed postprandial blood sugar targets  also  Maximize the dose of metformin to 2000 mg a day if tolerated, will also change him to metformin ER after his current prescription for convenience of once a day dosage  To review his blood sugars on the next visit along with fructosamine level and down the road will need another A1c  We will recheck prolactin level and also his testosterone level on his follow-up  Consultation with dietitian to be done  Start regular exercise program  Check urine microalbumin when blood sugars are controlled  Check fasting lipid panel when blood sugars are controlled, no recent levels available  HYPERTENSION: On the blood pressure is high today in the office he thinks this is white coat hypertension and he is monitoring blood pressure regularly at home with good control now  All the above problems were discussed in detail with the patient and all questions satisfactorily answered  Follow-up in 4 weeks  Patient Instructions  Dostinex 1/2 twice a week then 1 twice a week  Metformin 2 twice daily and then change to Er 1x daily  Please check blood sugars at least half the time about 2 hours after any meal and 3 times per week on waking up.  Please bring blood sugar monitor to each visit. Recommended blood sugar levels about 2 hours after meal is 140-160 and on waking up 90-130      Florinda Taflinger 08/06/2014, 4:24 PM   Note: This office note was prepared with Estate agent. Any transcriptional errors that result from this  process are unintentional.

## 2014-08-04 NOTE — Patient Instructions (Signed)
Dostinex 1/2 twice a week then 1 twice a week  Metformin 2 twice daily and then change to Er 1x daily  Please check blood sugars at least half the time about 2 hours after any meal and 3 times per week on waking up.  Please bring blood sugar monitor to each visit. Recommended blood sugar levels about 2 hours after meal is 140-160 and on waking up 90-130

## 2014-08-06 ENCOUNTER — Encounter: Payer: Self-pay | Admitting: Endocrinology

## 2014-08-06 DIAGNOSIS — D352 Benign neoplasm of pituitary gland: Secondary | ICD-10-CM | POA: Insufficient documentation

## 2014-08-06 DIAGNOSIS — E1165 Type 2 diabetes mellitus with hyperglycemia: Secondary | ICD-10-CM | POA: Insufficient documentation

## 2014-08-06 DIAGNOSIS — E119 Type 2 diabetes mellitus without complications: Secondary | ICD-10-CM | POA: Insufficient documentation

## 2014-08-15 ENCOUNTER — Telehealth: Payer: Self-pay | Admitting: Endocrinology

## 2014-08-15 ENCOUNTER — Other Ambulatory Visit: Payer: Self-pay | Admitting: *Deleted

## 2014-08-15 MED ORDER — CABERGOLINE 0.5 MG PO TABS: ORAL_TABLET | ORAL | Status: DC

## 2014-08-15 NOTE — Telephone Encounter (Signed)
Docinex rx needs to be half tablet twice a week  And then up to one tablet. The math is incorrect in the prescription. Can we recalculate in order for him to be able to get enough meds until his appt at the end of June.

## 2014-08-15 NOTE — Telephone Encounter (Signed)
rx sent

## 2014-09-18 ENCOUNTER — Other Ambulatory Visit: Payer: Self-pay | Admitting: Endocrinology

## 2014-09-20 LAB — BASIC METABOLIC PANEL
BUN / CREAT RATIO: 18 (ref 9–20)
BUN: 24 mg/dL (ref 6–24)
CO2: 24 mmol/L (ref 18–29)
CREATININE: 1.34 mg/dL — AB (ref 0.76–1.27)
Calcium: 9.7 mg/dL (ref 8.7–10.2)
Chloride: 101 mmol/L (ref 97–108)
GFR calc Af Amer: 68 mL/min/{1.73_m2} (ref >59)
GFR calc non Af Amer: 59 mL/min/{1.73_m2} — ABNORMAL LOW (ref >59)
Glucose: 297 mg/dL — ABNORMAL HIGH (ref 65–99)
Potassium: 4.3 mmol/L (ref 3.5–5.2)
SODIUM: 142 mmol/L (ref 134–144)

## 2014-09-20 LAB — PROLACTIN: Prolactin: 24.1 ng/mL — ABNORMAL HIGH (ref 4.0–15.2)

## 2014-09-22 ENCOUNTER — Encounter: Payer: Self-pay | Admitting: Dietician

## 2014-09-22 ENCOUNTER — Ambulatory Visit (INDEPENDENT_AMBULATORY_CARE_PROVIDER_SITE_OTHER): Payer: BLUE CROSS/BLUE SHIELD | Admitting: Endocrinology

## 2014-09-22 ENCOUNTER — Other Ambulatory Visit: Payer: Self-pay | Admitting: *Deleted

## 2014-09-22 ENCOUNTER — Encounter: Payer: Self-pay | Admitting: Endocrinology

## 2014-09-22 ENCOUNTER — Encounter: Payer: BLUE CROSS/BLUE SHIELD | Attending: Endocrinology | Admitting: Dietician

## 2014-09-22 VITALS — Ht 72.0 in | Wt 260.0 lb

## 2014-09-22 VITALS — BP 162/82 | HR 80 | Temp 98.2°F | Resp 16 | Ht 72.0 in | Wt 266.0 lb

## 2014-09-22 DIAGNOSIS — E1165 Type 2 diabetes mellitus with hyperglycemia: Secondary | ICD-10-CM | POA: Insufficient documentation

## 2014-09-22 DIAGNOSIS — I10 Essential (primary) hypertension: Secondary | ICD-10-CM | POA: Diagnosis not present

## 2014-09-22 DIAGNOSIS — IMO0002 Reserved for concepts with insufficient information to code with codable children: Secondary | ICD-10-CM

## 2014-09-22 DIAGNOSIS — D352 Benign neoplasm of pituitary gland: Secondary | ICD-10-CM | POA: Diagnosis not present

## 2014-09-22 DIAGNOSIS — Z713 Dietary counseling and surveillance: Secondary | ICD-10-CM | POA: Insufficient documentation

## 2014-09-22 NOTE — Patient Instructions (Signed)
Be as active as possible.  Find exercise that you enjoy doing.  What motivates you? Be mindful of your beverage choices.   Consistent meals daily.  Breakfast, Lunch, and Dinner Daily with consistent amounts of carbohydrates at each meal.   Aim for 30-45 grams of carbohydrate at each meal. Fiber recommendations:  35 grams daily (increase this gradually).  Non starchy vegetables, dried beans, whole grains. Be mindful when eating.  Food choices, portions, how you feel when you eat.  Avoid unconscious eating. You have been making great changes!  What motivates you to continue these changes and lifestyle habits?

## 2014-09-22 NOTE — Progress Notes (Signed)
Patient ID: Roberto [name], male   DOB: 1960/04/20, 54 y.o.   MRN: [id number]   Chief complaint: Follow-up of multiple problems  History of Present Illness:  PROBLEM 1: Pituitary tumor  His MRI on 07/18/14 showed a pituitary tumor with the following description:  2.3 x 2 x 1.8 cm mass centered in the sella with suprasellar extension and cavernous sinus extension greater on the left with an appearance most suggestive of pituitary macroadenoma. This impresses upon the optic chiasm  He is being followed by the neurosurgeon and also getting visual fields periodically He thinks his blurred vision is slightly better with better peripheral vision on the left side Also being followed by ophthalmologist for macular edema  PROBLEM 2:  Hyperprolactinemia:  As part of his evaluation for his pituitary tumor in the hospital he had a prolactin level checked and this was markedly increased at 1312 He had symptoms of decreased libido and gynecomastia Testosterone level is not available. He has normal thyroid levels  He  was started on Dostinex initially half tablet twice a week  and then 1 tablet twice a week on his initial consultation on 08/04/14 Initially had mild nausea but he is tolerating the Dostinex well.  Lab Results  Component Value Date   PROLACTIN 24.1* 09/18/2014   PROLACTIN 1312.0* 07/18/2014    No results found for: TESTOSTERONE  PROBLEM 3:  DIABETES type 2.   He was diagnosed to have this in 2014  and has been managed by a primary care physician who he works with. Previously on glipizide only He thinks his A1c has been checked periodically and has been usually in the 8-12% range, higher recently On admission to the hospital his glucose was 380 without any evidence of ketosis He was started on metformin 500 mg twice a day; this was changed to metformin ER for better compliance and he was told to increase the dose to 2000 mg a day but he forgot and is still taking only  1000 mg a day  He has seen the dietitian today and he thinks he can make substantial changes in his meal planning. Also in the last 2 weeks he has started an exercise program for about 30 minutes at a time  in the hospital in addition to his glipizide ER and  Although he did not bring his monitor for download he thinks his blood sugars are improving with the fasting readings about 130 recently.  Blood sugars in our after eating may be 170-190 However  lab glucose was nearly 300 with eating poorly  Still has difficulty losing weight  Wt Readings from Last 3 Encounters:  09/22/14 266 lb (120.657 kg)  09/22/14 260 lb (117.935 kg)  08/04/14 224 lb (101.606 kg)     Lab Results  Component Value Date   HGBA1C 12.1* 07/18/2014   Lab Results  Component Value Date   CREATININE 1.34* 09/18/2014      Past Medical History  Diagnosis Date  . Diabetes mellitus without complication   . Hypertension     No past surgical history on file.  Family History  Problem Relation Age of Onset  . Cancer Other   . Hypertension Other   . Diabetes Mother   . Diabetes Father   . Heart disease Neg Hx     Social History:  reports that he has never smoked. He has never used smokeless tobacco. He reports that he does not drink alcohol or use illicit drugs.  Allergies: No Known  Allergies    Medication List       This list is accurate as of: 09/22/14  5:14 PM.  Always use your most recent med list.               amLODipine 10 MG tablet  Commonly known as:  NORVASC  Take 10 mg by mouth daily.     aspirin EC 81 MG tablet  Take 81 mg by mouth daily.     cabergoline 0.5 MG tablet  Commonly known as:  DOSTINEX  Take 1/2 twice a week then Increase to 1 tablet twice a week     glipiZIDE 5 MG 24 hr tablet  Commonly known as:  GLUCOTROL XL  Take 2 tablets (10 mg total) by mouth daily with breakfast.     isosorbide mononitrate 60 MG 24 hr tablet  Commonly known as:  IMDUR  Take 60 mg by  mouth daily.     lisinopril 10 MG tablet  Commonly known as:  PRINIVIL,ZESTRIL  Take 1 tablet (10 mg total) by mouth 2 (two) times daily.     metFORMIN 500 MG 24 hr tablet  Commonly known as:  GLUCOPHAGE XR  Take 4 tablets daily     metoprolol succinate 50 MG 24 hr tablet  Commonly known as:  TOPROL-XL  Take 50 mg by mouth daily. Take with or immediately following a meal.        LABS:  Orders Only on 09/18/2014  Component Date Value Ref Range Status  . Glucose 09/18/2014 297* 65 - 99 mg/dL Final  . BUN 09/18/2014 24  6 - 24 mg/dL Final  . Creatinine, Ser 09/18/2014 1.34* 0.76 - 1.27 mg/dL Final  . GFR calc non Af Amer 09/18/2014 59* >59 mL/min/1.73 Final  . GFR calc Af Amer 09/18/2014 68  >59 mL/min/1.73 Final  . BUN/Creatinine Ratio 09/18/2014 18  9 - 20 Final  . Sodium 09/18/2014 142  134 - 144 mmol/L Final  . Potassium 09/18/2014 4.3  3.5 - 5.2 mmol/L Final  . Chloride 09/18/2014 101  97 - 108 mmol/L Final  . CO2 09/18/2014 24  18 - 29 mmol/L Final  . Calcium 09/18/2014 9.7  8.7 - 10.2 mg/dL Final  . Prolactin 09/18/2014 24.1* 4.0 - 15.2 ng/mL Final     REVIEW OF SYSTEMS:         Eyes: No known diabetic retinopathy, has had macular edema, followed regularly by ophthalmologist   Hypertension: has had a long history of high blood pressure He thinks his blood pressure is being better controlled and he is checking this regularly now and  blood pressure was normal with the neurosurgeon recently No history of problems with hypokalemia  Creatinine slightly higher now, taking lisinopril 10 mg   Lab Results  Component Value Date   CREATININE 1.34* 09/18/2014   BUN 24 09/18/2014   NA 142 09/18/2014   K 4.3 09/18/2014   CL 101 09/18/2014   CO2 24 09/18/2014    Diabetic foot exam in 5/16 showed no abnormality except onychomycosis and absent vibration sense     PHYSICAL EXAM:  BP 162/82 mmHg  Pulse 80  Temp(Src) 98.2 F (36.8 C)  Resp 16  Ht 6' (1.829 m)  Wt  266 lb (120.657 kg)  BMI 36.07 kg/m2  SpO2 94%    ASSESSMENT:   Macroprolactinoma of the pituitary gland with marked increase in prolactin level of 1315 Also has symptoms of hypogonadism. He has done very well with starting Dostinex  and titrating up to 0.5 mg twice a week, prolactin down to normal Also he thinks his visual fields are better subjectively  He is also being followed by neurosurgeon and at some point will need follow-up MRI.  DIABETES with mild obesity and poor control. Although fructosamine or A1c not available to a judge his recent control he thinks his readings are better with continuing metformin but likely has some postprandial hyperglycemia especially when not watching his diet. He has just started an exercise program and discussed need for substantial weight loss Currently on metformin 1000 mg a day and do not increase the dose to 2000 as directed With seeing the dietitian today he apparently has understood meal planning better and is ready to make substantial changes  PLAN:   Pituitary tumor: This appears to be adequately  treated with Dostinex 0.5 mg twice a week Prolactin is back to normal Will also most likely need to follow pituitary MRI in a few months to assess size of the tumor Also will need to follow testosterone levels to see if they  normalized with treatment of his hyperprolactinemia  Visual fields to be assessed by ophthalmologist and followed closely  For diabetes:   Start checking blood sugars 2 hours after meals and bring monitor for download on each visit  Increase metformin to 4 tablets daily, may take them together  Potentially may need to reduce glipizide if blood sugars are low normal during the day  Consider adding a weekly GLP-1 drug if weight loss not achieved   Check urine microalbumin when blood sugars are controlled  Check fasting lipid panel when blood sugars are controlled, no recent levels available  HYPERTENSION: with  amlodipine and lisinopril the blood pressure is high today in the office he thinks this is white coat hypertension and he will need to monitor this regularly at home Also need to continue monitoring creatinine as it is high normal now  Follow-up in  6  weeks  Patient Instructions  Check blood sugars on waking up .Marland Kitchen3-4  .Marland Kitchen times a week Also check blood sugars about 2 hours after a meal and do this after different meals by rotation  Recommended blood sugar levels on waking up is 90-130 and about 2 hours after meal is 140-180 Please bring blood sugar monitor to each visit.  Metformin 4 daily    Mizuki Hoel 09/22/2014, 5:14 PM   Note: This office note was prepared with Estate agent. Any transcriptional errors that result from this process are unintentional.

## 2014-09-22 NOTE — Patient Instructions (Signed)
Check blood sugars on waking up .Marland Kitchen3-4  .Marland Kitchen times a week Also check blood sugars about 2 hours after a meal and do this after different meals by rotation  Recommended blood sugar levels on waking up is 90-130 and about 2 hours after meal is 140-180 Please bring blood sugar monitor to each visit.  Metformin 4 daily

## 2014-09-22 NOTE — Progress Notes (Signed)
  Medical Nutrition Therapy:  Appt start time: [id number] end time:  [id number].   Assessment:  Primary concerns today: Patient is here alone.  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  He was diagnosed with diabetes about 2 years ago but was in denial at that time.  States that he continued to work long hours, eat out a lot, drink regular soda, and did not have time for exercise.  HgBA1C 12.1 (07/18/14).  Hx also includes a prolactin secreting pituitary adenoma, benign tumor of pituitary gland with compression on the optic nerve and HTN.  He reports taking his medication.  Weight today 266 lbs (adjusted to 260 lbs due to steal toed boots)  States that this is increased from 224 lbs 3 weeks ago at this office which he things was an error.  He is currently trying to lose weight and has made many dietary changes avoiding most carbs   He wishes to be weighed on the Body Composition scale on next visit.    Patient lives with wife. 2 children in college. Travels between multiple homes.  Eats out frequently.    Preferred Learning Style:   No preference indicated   Learning Readiness:   Ready  Change in progress   MEDICATIONS: see list to include:  Metformin and Glucotrol   DIETARY INTAKE: Recently patient changed to avoid bread and other higher carbs as well as eliminating regular soda and sweets.  Patient reports that he was  "in denial about the diabetes" prior to recent hospitalization.  24-hr recall:  B ( AM): 1 patty of sausage and 1 scrambled egg  Snk ( AM): none  L ( PM): 1/2 lb chicken salad with no mayo Snk ( PM): none D ( PM): 1 protein (steak, burger, or chicken) and small salad without dressing Snk ( PM): none Beverages: water, occasional diet coke  Usual physical activity: none  Estimated energy needs: 1600 calories 180 g carbohydrates 100 g protein 53 g fat  Progress Towards Goal(s):  In  progress.   Nutritional Diagnosis:  NB-1.1 Food and nutrition-related knowledge deficit As related to balance of carbohydrate, protein, and fat.  As evidenced by diet hx..    Intervention:  Nutrition counseling and diabetes education initiated. Discussed Carb Counting by food group as method of portion control, reading food labels, and benefits of increased activity.  Discussed A1C level.  Discussed weight control, sustainable lifestyle changes for weight loss and improvements of blood sugar.  Teaching Method Utilized:  Visual Auditory Hands on  Handouts given during visit include:  Meal plan card  A1C  Barriers to learning/adherence to lifestyle change: increased traveling  Demonstrated degree of understanding via:  Teach Back   Monitoring/Evaluation:  Dietary intake, exercise, label reading, and body weight in 4 week(s).

## 2014-10-08 ENCOUNTER — Other Ambulatory Visit: Payer: Self-pay | Admitting: Endocrinology

## 2014-10-17 ENCOUNTER — Encounter: Payer: BLUE CROSS/BLUE SHIELD | Attending: Endocrinology | Admitting: Dietician

## 2014-10-17 DIAGNOSIS — E1165 Type 2 diabetes mellitus with hyperglycemia: Secondary | ICD-10-CM

## 2014-10-17 DIAGNOSIS — Z713 Dietary counseling and surveillance: Secondary | ICD-10-CM | POA: Diagnosis not present

## 2014-10-17 DIAGNOSIS — IMO0002 Reserved for concepts with insufficient information to code with codable children: Secondary | ICD-10-CM

## 2014-10-17 NOTE — Patient Instructions (Signed)
Consider pedometer app.  10,000 steps is a general goal Consider Calorie [employer] app.  Inform yourself about the food you are eating (especially out to eat). Be mindful of your sodium intake. Increase physical activity as able.  Walking is a good choice.

## 2014-10-17 NOTE — Progress Notes (Signed)
Medical Nutrition Therapy:  Appt start time: [id number] end time:  1715.   Assessment:  09/22/14 Primary concerns today: Patient is here alone.  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  He was diagnosed with diabetes about 2 years ago but was in denial at that time.  States that he continued to work long hours, eat out a lot, drink regular soda, and did not have time for exercise.  HgBA1C 12.1 (07/18/14).  Hx also includes a prolactin secreting pituitary adenoma, benign tumor of pituitary gland with compression on the optic nerve and HTN.  He reports taking his medication.  Weight today 266 lbs (adjusted to 260 lbs due to steal toed boots)  States that this is increased from 224 lbs 3 weeks ago at this office which he things was an error.  He is currently trying to lose weight and has made many dietary changes avoiding most carbs   He wishes to be weighed on the Body Composition scale on next visit.    Patient lives with wife. 2 children in college. Travels between multiple homes.  Eats out frequently.    10/17/14: Patient is here alone.  States that he has tried to increase carbs slightly but thinks that this in contributing to fluid retention.  He is also concerned that increased Prolactin and increased Metformin is contributing to this as well.  He wants to resume a low carb, high protein diet.  Weight is unchanged overall although different scales.  Patient has not found the time to exercise,  And sleeps only 4-5 hours per night which he has done for years.  He states that the amount of urination has decreased as well.  Discussed concern of decreased caloric intake and decreased metabolism preventing weight loss.  "If I just have the self control and eat less, I will lose weight."  TANITA  BODY COMP RESULTS (?acuracy in this pt) 10/17/14 261   BMI (kg/m^2) 35.4   Fat Mass (lbs) 52.5   Fat Free Mass (lbs) 208.5   Total Body Water  (lbs) 152.5     Preferred Learning Style:   No preference indicated   Learning Readiness:   Ready  Change in progress   MEDICATIONS: see list to include:  Metformin and Glucotrol   DIETARY INTAKE: Recently patient changed to avoid bread and other higher carbs as well as eliminating regular soda and sweets.  Patient reports that he was  "in denial about the diabetes" prior to recent hospitalization.  24-hr recall:  B ( AM): 1 patty of sausage and 1 scrambled egg, 1 slice [employer] bread  Snk ( AM): none  L ( PM): fruit and salad with protein Snk ( PM): none D ( PM): Salmon and 2 vegetables ([employer]) Snk ( PM): none Beverages: water, occasional diet coke  Usual physical activity: walking 1/2 mile every 3rd day.  Estimated energy needs: 2000 calories 225 g carbohydrates 150 g protein 56 g fat  Progress Towards Goal(s):  In progress.   Nutritional Diagnosis:  NB-1.1 Food and nutrition-related knowledge deficit As related to balance of carbohydrate, protein, and fat.  As evidenced by diet hx..    Intervention:  Nutrition counseling and diabetes education continued.  Weight discussed with concern of low calorie intake decreasing metabolism.  Patient is concerned that increasing carb intake slightly has increased fluid retention and prevented weight loss.  We now have the body composition scale results  and can use this as a comparison for other visits.  Patient prefers to follow a more strict low carb diet.  Patient is concerned about water retention.  Discussed sodium awareness of foods when eating out.   We discussed the implication of inadequate sleep, hormone balance, and increased inflammation, and inactivity on weight as well.  Nutrition to decrease inflammation discussed briefly.  Patient wants to try the high protein diet for another month.  Consider pedometer app.  10,000 steps is a general goal Consider Calorie [employer] app.  Inform yourself about the food you are eating (especially  out to eat). Be mindful of your sodium intake. Increase physical activity as able.  Walking is a good choice.  Teaching Method Utilized:  Visual Auditory Hands on  Handouts given during visit include:  Body Composition scale results  Barriers to learning/adherence to lifestyle change: increased traveling  Demonstrated degree of understanding via:  Teach Back   Monitoring/Evaluation:  Dietary intake, exercise, label reading, and body weight in 4 week(s).

## 2014-10-18 ENCOUNTER — Other Ambulatory Visit: Payer: Self-pay | Admitting: Endocrinology

## 2014-10-19 LAB — COMPREHENSIVE METABOLIC PANEL
ALT: 15 IU/L (ref 0–44)
AST: 20 IU/L (ref 0–40)
Albumin/Globulin Ratio: 1.1 (ref 1.1–2.5)
Albumin: 3.6 g/dL (ref 3.5–5.5)
Alkaline Phosphatase: 87 IU/L (ref 39–117)
BUN/Creatinine Ratio: 27 — ABNORMAL HIGH (ref 9–20)
BUN: 31 mg/dL — AB (ref 6–24)
Bilirubin Total: 0.3 mg/dL (ref 0.0–1.2)
CALCIUM: 9.4 mg/dL (ref 8.7–10.2)
CO2: 20 mmol/L (ref 18–29)
CREATININE: 1.13 mg/dL (ref 0.76–1.27)
Chloride: 96 mmol/L — ABNORMAL LOW (ref 97–108)
GFR calc Af Amer: 84 mL/min/{1.73_m2} (ref >59)
GFR calc non Af Amer: 72 mL/min/{1.73_m2} (ref >59)
GLOBULIN, TOTAL: 3.3 g/dL (ref 1.5–4.5)
GLUCOSE: 319 mg/dL — AB (ref 65–99)
Potassium: 4.2 mmol/L (ref 3.5–5.2)
SODIUM: 138 mmol/L (ref 134–144)
TOTAL PROTEIN: 6.9 g/dL (ref 6.0–8.5)

## 2014-10-19 LAB — HGB A1C W/O EAG: HEMOGLOBIN A1C: 9.9 % — AB (ref 4.8–5.6)

## 2014-10-19 LAB — LIPID PANEL W/O CHOL/HDL RATIO
Cholesterol, Total: 134 mg/dL (ref 100–199)
HDL: 24 mg/dL — ABNORMAL LOW (ref >39)
LDL Calculated: 60 mg/dL (ref 0–99)
Triglycerides: 252 mg/dL — ABNORMAL HIGH (ref 0–149)
VLDL Cholesterol Cal: 50 mg/dL — ABNORMAL HIGH (ref 5–40)

## 2014-10-19 LAB — TESTOSTERONE: TESTOSTERONE: 70 ng/dL — AB (ref 348–1197)

## 2014-10-19 LAB — PROLACTIN: PROLACTIN: 11.9 ng/mL (ref 4.0–15.2)

## 2014-10-22 NOTE — Progress Notes (Signed)
Quick Note:  Please let Dr Mayer Masker know glucose is 319, A1c 9.9; prolactin ok; to see me sooner than 8/26 if not feeling well ______

## 2014-10-31 ENCOUNTER — Encounter: Payer: Self-pay | Admitting: *Deleted

## 2014-11-03 ENCOUNTER — Other Ambulatory Visit: Payer: Self-pay | Admitting: Endocrinology

## 2014-11-06 ENCOUNTER — Other Ambulatory Visit: Payer: Self-pay | Admitting: *Deleted

## 2014-11-06 ENCOUNTER — Telehealth: Payer: Self-pay | Admitting: Endocrinology

## 2014-11-06 MED ORDER — CABERGOLINE 0.5 MG PO TABS: ORAL_TABLET | ORAL | Status: DC

## 2014-11-06 NOTE — Telephone Encounter (Signed)
Patient called and would like a refill sent to his pharmacy   Rx: Dostinex .5mg   Pharmacy: [facility] [address]    Thank you

## 2014-11-06 NOTE — Telephone Encounter (Signed)
rx sent

## 2014-11-20 ENCOUNTER — Ambulatory Visit: Payer: BLUE CROSS/BLUE SHIELD | Admitting: Endocrinology

## 2014-11-24 ENCOUNTER — Encounter: Payer: BLUE CROSS/BLUE SHIELD | Admitting: Dietician

## 2014-11-24 ENCOUNTER — Ambulatory Visit: Payer: BLUE CROSS/BLUE SHIELD | Admitting: Endocrinology

## 2014-11-24 NOTE — Progress Notes (Signed)
Medical Nutrition Therapy:  Appt start time: [id number] end time:  1715.   Assessment:  09/22/14 Primary concerns today: Patient is here alone.  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  He was diagnosed with diabetes about 2 years ago but was in denial at that time.  States that he continued to work long hours, eat out a lot, drink regular soda, and did not have time for exercise.  HgBA1C 12.1 (07/18/14).  Hx also includes a prolactin secreting pituitary adenoma, benign tumor of pituitary gland with compression on the optic nerve and HTN.  He reports taking his medication.  Weight today 266 lbs (adjusted to 260 lbs due to steal toed boots)  States that this is increased from 224 lbs 3 weeks ago at this office which he things was an error.  He is currently trying to lose weight and has made many dietary changes avoiding most carbs   He wishes to be weighed on the Body Composition scale on next visit.    Patient lives with wife. 2 children in college. Travels between multiple homes.  Eats out frequently.    10/17/14: Patient is here alone.  States that he has tried to increase carbs slightly but thinks that this in contributing to fluid retention.  He is also concerned that increased Prolactin and increased Metformin is contributing to this as well.  He wants to resume a low carb, high protein diet.  Weight is unchanged overall although different scales.  Patient has not found the time to exercise,  And sleeps only 4-5 hours per night which he has done for years.  He states that the amount of urination has decreased as well.  Discussed concern of decreased caloric intake and decreased metabolism preventing weight loss.  "If I just have the self control and eat less, I will lose weight."  TANITA  BODY COMP RESULTS (?acuracy in this pt) 10/17/14 261   BMI (kg/m^2) 35.4   Fat Mass (lbs) 52.5   Fat Free Mass (lbs) 208.5   Total Body Water  (lbs) 152.5     Preferred Learning Style:   No preference indicated   Learning Readiness:   Ready  Change in progress   MEDICATIONS: see list to include:  Metformin and Glucotrol   DIETARY INTAKE: Recently patient changed to avoid bread and other higher carbs as well as eliminating regular soda and sweets.  Patient reports that he was  "in denial about the diabetes" prior to recent hospitalization.  24-hr recall:  B ( AM): 1 patty of sausage and 1 scrambled egg, 1 slice [employer] bread  Snk ( AM): none  L ( PM): fruit and salad with protein Snk ( PM): none D ( PM): Salmon and 2 vegetables ([employer]) Snk ( PM): none Beverages: water, occasional diet coke  Usual physical activity: walking 1/2 mile every 3rd day.  Estimated energy needs: 2000 calories 225 g carbohydrates 150 g protein 56 g fat  Progress Towards Goal(s):  In progress.   Nutritional Diagnosis:  NB-1.1 Food and nutrition-related knowledge deficit As related to balance of carbohydrate, protein, and fat.  As evidenced by diet hx..    Intervention:  Nutrition counseling and diabetes education continued.  Weight discussed with concern of low calorie intake decreasing metabolism.  Patient is concerned that increasing carb intake slightly has increased fluid retention and prevented weight loss.  We now have the body composition scale results  and can use this as a comparison for other visits.  Patient prefers to follow a more strict low carb diet.  Patient is concerned about water retention.  Discussed sodium awareness of foods when eating out.   We discussed the implication of inadequate sleep, hormone balance, and increased inflammation, and inactivity on weight as well.  Nutrition to decrease inflammation discussed briefly.  Patient wants to try the high protein diet for another month.  Consider pedometer app.  10,000 steps is a general goal Consider Calorie [employer] app.  Inform yourself about the food you are eating (especially  out to eat). Be mindful of your sodium intake. Increase physical activity as able.  Walking is a good choice.  Teaching Method Utilized:  Visual Auditory Hands on  Handouts given during visit include:  Body Composition scale results  Barriers to learning/adherence to lifestyle change: increased traveling  Demonstrated degree of understanding via:  Teach Back   Monitoring/Evaluation:  Dietary intake, exercise, label reading, and body weight in 4 week(s).

## 2014-12-15 ENCOUNTER — Telehealth: Payer: Self-pay | Admitting: Endocrinology

## 2014-12-15 ENCOUNTER — Other Ambulatory Visit: Payer: Self-pay | Admitting: *Deleted

## 2014-12-15 MED ORDER — CABERGOLINE 0.5 MG PO TABS: ORAL_TABLET | ORAL | Status: DC

## 2014-12-15 NOTE — Telephone Encounter (Signed)
ok 

## 2014-12-15 NOTE — Telephone Encounter (Signed)
Please see below, patient has not been seen since may, and has cancelled his last 2 appointments.

## 2014-12-15 NOTE — Telephone Encounter (Signed)
Noted, rx sent.

## 2014-12-15 NOTE — Telephone Encounter (Signed)
[facility] note dated 12/15/14 at 2:47 AM Initial Comment Caller states he needs to leave a message for Dr. Dwyane Dee. He needs to get a refil on "Cabergoline 1.0 mg tablet".

## 2015-01-05 ENCOUNTER — Ambulatory Visit (INDEPENDENT_AMBULATORY_CARE_PROVIDER_SITE_OTHER): Payer: BLUE CROSS/BLUE SHIELD | Admitting: Endocrinology

## 2015-01-05 ENCOUNTER — Encounter: Payer: Self-pay | Admitting: Endocrinology

## 2015-01-05 VITALS — BP 160/95 | HR 84 | Temp 98.2°F | Resp 16 | Ht 72.0 in | Wt 257.6 lb

## 2015-01-05 DIAGNOSIS — I10 Essential (primary) hypertension: Secondary | ICD-10-CM

## 2015-01-05 DIAGNOSIS — D352 Benign neoplasm of pituitary gland: Secondary | ICD-10-CM | POA: Diagnosis not present

## 2015-01-05 DIAGNOSIS — D6489 Other specified anemias: Secondary | ICD-10-CM | POA: Diagnosis not present

## 2015-01-05 DIAGNOSIS — IMO0002 Reserved for concepts with insufficient information to code with codable children: Secondary | ICD-10-CM

## 2015-01-05 DIAGNOSIS — E1165 Type 2 diabetes mellitus with hyperglycemia: Secondary | ICD-10-CM | POA: Diagnosis not present

## 2015-01-05 DIAGNOSIS — IMO0001 Reserved for inherently not codable concepts without codable children: Secondary | ICD-10-CM

## 2015-01-05 LAB — MICROALBUMIN / CREATININE URINE RATIO
Creatinine,U: 85.2 mg/dL
Microalb Creat Ratio: 90.3 mg/g — ABNORMAL HIGH (ref 0.0–30.0)
Microalb, Ur: 77 mg/dL — ABNORMAL HIGH (ref 0.0–1.9)

## 2015-01-05 MED ORDER — CANAGLIFLOZIN 100 MG PO TABS: 100.0000 mg | ORAL_TABLET | Freq: Every day | ORAL | Status: DC

## 2015-01-05 NOTE — Patient Instructions (Addendum)
Check blood sugars on waking up .. 3 .. times a week Also check blood sugars about 2 hours after a meal and do this after different meals by rotation  Recommended blood sugar levels on waking up is 90-130 and about 2 hours after meal is 140-180 Please bring blood sugar monitor to each visit.  Invokana in am  If Sugars <85-90 stop glipizide

## 2015-01-05 NOTE — Progress Notes (Signed)
Patient ID: Roberto [name], male   DOB: [birth year], 54 y.o.   MRN: [id number]   Chief complaint: Follow-up of multiple endocrine problems  History of Present Illness:  PROBLEM 1: Pituitary tumor  His MRI on 07/18/14 showed a pituitary tumor with the following description: 2.3 x 2 x 1.8 cm mass centered in the sella with suprasellar extension and cavernous sinus extension greater on the left with an appearance most suggestive of pituitary macroadenoma. This impresses upon the optic chiasm  Had difficulty with peripheral vision on the left side He being followed by ophthalmologist for macular edema and he has had periodic visual fields tested which are still abnormal, to have follow-up in 2 months  PROBLEM 2:  Hyperprolactinemia:  As part of his evaluation for his pituitary tumor in the hospital he had a prolactin level checked and this was markedly increased at 1312 He had symptoms of decreased libido and gynecomastia Testosterone level was low at 70 He has normal thyroid levels  He  was started on Dostinex 0.5 mg initially half tablet twice a week and then 1 tablet twice a week on his initial consultation on 08/04/14 Not clear if he was taking 2 tablets twice a week for some time because of misunderstanding the dose He is tolerating the Dostinex well.  Lab Results  Component Value Date   PROLACTIN 11.9 10/18/2014   PROLACTIN 24.1* 09/18/2014   PROLACTIN 1312.0* 07/18/2014    Lab Results  Component Value Date   TESTOSTERONE 70* 10/18/2014    PROBLEM 3:  DIABETES type II with obesity    He was diagnosed to have this in 2014  and has been managed by a primary care physician who he works with. He thinks his A1c has been checked periodically and has been usually in the 8-12% range previously On admission to the hospital his glucose was 380 without any evidence of ketosis Previously on glipizide only and now on 2000 mg of metformin ER  He has seen the dietitian and is  trying to make some changes in diet Has lost 9 pounds since his last visit in June He has started an exercise program for about 30 minutes at a time  Although he did not bring his monitor for download he thinks his blood sugars are improving  Checking blood sugars before breakfast which are usually in the 135-145 range, an hour after lunch blood sugars are 180-195 No hypoglycemia with glipizide Did not bring his monitor for download Last A1c was 9.9  Weight history:  Wt Readings from Last 3 Encounters:  01/05/15 257 lb 9.6 oz (116.847 kg)  09/22/14 266 lb (120.657 kg)  09/22/14 260 lb (117.935 kg)    Lab Results  Component Value Date   HGBA1C 9.9* 10/18/2014   HGBA1C 12.1* 07/18/2014   Lab Results  Component Value Date   LDLCALC 60 10/18/2014   CREATININE 1.13 10/18/2014      Past Medical History  Diagnosis Date  . Diabetes mellitus without complication ([place])   . Hypertension     No past surgical history on file.  Family History  Problem Relation Age of Onset  . Cancer Other   . Hypertension Other   . Diabetes Mother   . Diabetes Father   . Heart disease Neg Hx     Social History:  reports that he has never smoked. He has never used smokeless tobacco. He reports that he does not drink alcohol or use illicit drugs.  Allergies: No Known  Allergies    Medication List       This list is accurate as of: 01/05/15  5:01 PM.  Always use your most recent med list.               amLODipine 10 MG tablet  Commonly known as:  NORVASC  Take 10 mg by mouth daily.     aspirin EC 81 MG tablet  Take 81 mg by mouth daily.     cabergoline 0.5 MG tablet  Commonly known as:  DOSTINEX  Take 1 tablet twice a week     canagliflozin 100 MG Tabs tablet  Commonly known as:  INVOKANA  Take 1 tablet (100 mg total) by mouth daily.     glipiZIDE 5 MG 24 hr tablet  Commonly known as:  GLUCOTROL XL  TAKE 2 TABLETS EVERY DAY WITH BREAKFAST     isosorbide mononitrate 60 MG  24 hr tablet  Commonly known as:  IMDUR  Take 60 mg by mouth daily.     lisinopril 10 MG tablet  Commonly known as:  PRINIVIL,ZESTRIL  Take 1 tablet (10 mg total) by mouth 2 (two) times daily.     metFORMIN 500 MG 24 hr tablet  Commonly known as:  GLUCOPHAGE-XR  TAKE 4 TABLETS BY MOUTH EVERY DAY     metoprolol succinate 50 MG 24 hr tablet  Commonly known as:  TOPROL-XL  Take 50 mg by mouth daily. Take with or immediately following a meal.        LABS:  No visits with results within 1 Week(s) from this visit. Latest known visit with results is:  Orders Only on 10/18/2014  Component Date Value Ref Range Status  . Glucose 10/18/2014 319* 65 - 99 mg/dL Final  . BUN 10/18/2014 31* 6 - 24 mg/dL Final  . Creatinine, Ser 10/18/2014 1.13  0.76 - 1.27 mg/dL Final  . GFR calc non Af Amer 10/18/2014 72  >59 mL/min/1.73 Final  . GFR calc Af Amer 10/18/2014 84  >59 mL/min/1.73 Final  . BUN/Creatinine Ratio 10/18/2014 27* 9 - 20 Final  . Sodium 10/18/2014 138  134 - 144 mmol/L Final  . Potassium 10/18/2014 4.2  3.5 - 5.2 mmol/L Final  . Chloride 10/18/2014 96* 97 - 108 mmol/L Final  . CO2 10/18/2014 20  18 - 29 mmol/L Final  . Calcium 10/18/2014 9.4  8.7 - 10.2 mg/dL Final  . Total Protein 10/18/2014 6.9  6.0 - 8.5 g/dL Final  . Albumin 10/18/2014 3.6  3.5 - 5.5 g/dL Final  . Globulin, Total 10/18/2014 3.3  1.5 - 4.5 g/dL Final  . Albumin/Globulin Ratio 10/18/2014 1.1  1.1 - 2.5 Final  . Bilirubin Total 10/18/2014 0.3  0.0 - 1.2 mg/dL Final  . Alkaline Phosphatase 10/18/2014 87  39 - 117 IU/L Final  . AST 10/18/2014 20  0 - 40 IU/L Final  . ALT 10/18/2014 15  0 - 44 IU/L Final  . Cholesterol, Total 10/18/2014 134  100 - 199 mg/dL Final  . Triglycerides 10/18/2014 252* 0 - 149 mg/dL Final  . HDL 10/18/2014 24* >39 mg/dL Final   Comment: According to ATP-III Guidelines, HDL-C >59 mg/dL is considered a negative risk factor for CHD.   Marland Kitchen VLDL Cholesterol Cal 10/18/2014 50* 5 - 40 mg/dL  Final  . LDL Calculated 10/18/2014 60  0 - 99 mg/dL Final  . Testosterone 10/18/2014 70* 348 - 1197 ng/dL Final  . Comment, Testosterone 10/18/2014 Comment   Final  Comment: Adult male reference interval is based on a population of lean males up to 56 years old.   . Prolactin 10/18/2014 11.9  4.0 - 15.2 ng/mL Final  . Hgb A1c MFr Bld 10/18/2014 9.9* 4.8 - 5.6 % Final   Comment:          Pre-diabetes: 5.7 - 6.4          Diabetes: >6.4          Glycemic control for adults with diabetes: <7.0      REVIEW OF SYSTEMS:        Eyes: No known diabetic retinopathy, has had macular edema, followed regularly by ophthalmologist   Hypertension: has had a long history of high blood pressure Currently on regimen of lisinopril, amlodipine and low dose metoprolol  He thinks his blood pressure is  controlled and he is checking this regularly now with readings about 135/75 Cannot explain why his blood pressure is so high today except he did not take his medications this morning No history of problems with hypokalemia    Lab Results  Component Value Date   CREATININE 1.13 10/18/2014   BUN 31* 10/18/2014   NA 138 10/18/2014   K 4.2 10/18/2014   CL 96* 10/18/2014   CO2 20 10/18/2014    Diabetic foot exam in 5/16 showed no abnormality except onychomycosis and absent vibration sense     PHYSICAL EXAM:  BP 174/118 mmHg  Pulse 84  Temp(Src) 98.2 F (36.8 C)  Resp 16  Ht 6' (1.829 m)  Wt 257 lb 9.6 oz (116.847 kg)  BMI 34.93 kg/m2  SpO2 95%    ASSESSMENT:   Macroprolactinoma of the pituitary gland with very high baseline prolactin level of 1315 Also has had symptoms of hypogonadism which appear to be better now. He has done very well with starting Dostinex and titrating up to 0.5 mg twice a week, prolactin had come down to normal No recent difficulties with visual fields or headaches Needs repeat prolactin level and follow-up MRI  DIABETES with mild obesity and poor control.  No  known complications Although recent A1c not available his last level was over 9% Home blood sugars are reportedly mildly increased both morning and postprandial but no monitor download available Currently on maximum dose metformin ER and glipizide ER 5 mg He has lost weight since his last visit with continuing to improve diet and regular exercise program  PLAN:   Pituitary tumor: This appears to be adequately  treated with Dostinex 0.5 mg twice a week Prolactin will need to be repeated to ensure stability on current regimen  Will also need to order pituitary MRI to assess change in size of the tumor since original diagnosis  Also will need to follow testosterone levels to see if it normalized with treatment of his hyperprolactinemia  Visual fields to be assessed by ophthalmologist and followed closely  For diabetes:   Continue checking blood sugars 2 hours after meals and bring monitor for download on each visit Discussed options for improving control and preventing progression of diabetes with either SGLT 2 or GLP-1 drugs.  Since he does need better blood pressure control and weight loss he is agreeable to trying an SGLT 2 drug for now which will be simpler for him.Discussed action of SGLT 2 drugs on lowering glucose by decreasing kidney absorption of glucose, benefits of weight loss and lower blood pressure, possible side effects including candidiasis and dosage regimen   To start with 100 mg  of Invokana in the morning and continue glipizide and metformin  Consider reducing or stopping glipizide ER when blood sugars are low normal  Check A1c today  Check urine microalbumin today  Recheck lipids  HYPERTENSION: with amlodipine and lisinopril the blood pressure is high today.  He reports better blood pressure readings at home May improve with adding Invokana Also need to continue monitoring creatinine especially with starting Invokana  Follow-up in  6  weeks  Patient  Instructions  Check blood sugars on waking up .. 3 .. times a week Also check blood sugars about 2 hours after a meal and do this after different meals by rotation  Recommended blood sugar levels on waking up is 90-130 and about 2 hours after meal is 140-180 Please bring blood sugar monitor to each visit.  Invokana in am  If Sugars <85-90 stop glipizide   Counseling time on subjects discussed above is over 50% of today's 25 minute visit  Menna Abeln 01/05/2015, 5:01 PM   Note: This office note was prepared with Estate agent. Any transcriptional errors that result from this process are unintentional.

## 2015-01-06 LAB — PROLACTIN: Prolactin: 12.7 ng/mL (ref 4.0–15.2)

## 2015-01-06 LAB — TESTOSTERONE: Testosterone: 103 ng/dL — ABNORMAL LOW (ref 348–1197)

## 2015-01-08 ENCOUNTER — Other Ambulatory Visit: Payer: Self-pay

## 2015-01-08 ENCOUNTER — Telehealth: Payer: Self-pay

## 2015-01-08 DIAGNOSIS — I160 Hypertensive urgency: Secondary | ICD-10-CM

## 2015-01-08 LAB — HEMOGLOBIN A1C: Hgb A1c MFr Bld: 10.6 % — ABNORMAL HIGH (ref 4.6–6.5)

## 2015-01-08 NOTE — Telephone Encounter (Signed)
Left pt informing him via VM that his blood and urine specimen will need to be recollected due an transportation issue

## 2015-01-10 ENCOUNTER — Telehealth: Payer: Self-pay | Admitting: Endocrinology

## 2015-01-10 ENCOUNTER — Encounter: Payer: Self-pay | Admitting: *Deleted

## 2015-01-10 NOTE — Telephone Encounter (Signed)
Patient stated that the labs that were drawn Oct 7th was lost or something happened to it, he will have to be drawn again. And the labs he need drawn before his appointment, He would like the order sent to his home address,   [address] [place] [address]

## 2015-01-10 NOTE — Telephone Encounter (Signed)
Lab results and orders mailed to patient

## 2015-01-10 NOTE — Telephone Encounter (Signed)
Results are available. Let him know that his prolactin level is normal to continue the same dose Testosterone level is low and would like to consider using AndroGel 1.62% if he agrees. A1c was over 10% Also since urine microalbumin ratio was high at 90 would like to increase his lisinopril to 20 mg We can mail his labs to his home

## 2015-01-10 NOTE — Telephone Encounter (Signed)
Please see below, do you know anything about this? Please advise

## 2015-01-19 ENCOUNTER — Telehealth: Payer: Self-pay | Admitting: Endocrinology

## 2015-01-19 ENCOUNTER — Other Ambulatory Visit: Payer: Self-pay | Admitting: Endocrinology

## 2015-01-19 NOTE — Telephone Encounter (Addendum)
Patient called stating that he needs to have labs redrawn; please print orders and mail to his home address  Also, he would like to have his Rx refilled   Rx: Cortosil ?  Please advise    Thank you

## 2015-01-23 ENCOUNTER — Other Ambulatory Visit: Payer: Self-pay | Admitting: *Deleted

## 2015-01-23 NOTE — Telephone Encounter (Signed)
Lab orders mailed again.

## 2015-01-27 ENCOUNTER — Other Ambulatory Visit: Payer: Self-pay | Admitting: Endocrinology

## 2015-03-02 ENCOUNTER — Encounter: Payer: Self-pay | Admitting: Endocrinology

## 2015-03-02 ENCOUNTER — Ambulatory Visit (INDEPENDENT_AMBULATORY_CARE_PROVIDER_SITE_OTHER): Payer: BLUE CROSS/BLUE SHIELD | Admitting: Endocrinology

## 2015-03-02 ENCOUNTER — Other Ambulatory Visit: Payer: Self-pay | Admitting: *Deleted

## 2015-03-02 VITALS — BP 130/80 | HR 78 | Temp 98.5°F | Resp 14 | Ht 72.0 in | Wt 257.2 lb

## 2015-03-02 DIAGNOSIS — IMO0001 Reserved for inherently not codable concepts without codable children: Secondary | ICD-10-CM

## 2015-03-02 DIAGNOSIS — E1165 Type 2 diabetes mellitus with hyperglycemia: Principal | ICD-10-CM

## 2015-03-02 DIAGNOSIS — D352 Benign neoplasm of pituitary gland: Secondary | ICD-10-CM

## 2015-03-02 NOTE — Progress Notes (Signed)
Patient ID: Roberto [name], male   DOB: [birth year], 54 y.o.   MRN: [id number]   Chief complaint: Follow-up of multiple endocrine problems  History of Present Illness:  PROBLEM 1: Pituitary tumor  His MRI on 07/18/14 showed a pituitary tumor with the following description: 2.3 x 2 x 1.8 cm mass centered in the sella with suprasellar extension and cavernous sinus extension greater on the left with an appearance most suggestive of pituitary macroadenoma. This impresses upon the optic chiasm  Had difficulty with peripheral vision on the left side but this has improved He being followed by ophthalmologist periodically for this and macular edema  Has not had a follow-up MRI  PROBLEM 2:  Hyperprolactinemia:  On evaluation for his pituitary tumor in the hospital he had a prolactin level checked and this was markedly increased at 1312 He had symptoms of decreased libido and gynecomastia Testosterone level was low at 70 He has had normal thyroid levels  He  was started on Dostinex 0.5 mg initially half tablet twice a week and then 1 tablet twice a week on his initial consultation on 08/04/14 He is tolerating the Dostinex well. Prolactin has been consistently normal since 7/16  Lab Results  Component Value Date   PROLACTIN 12.7 01/05/2015   PROLACTIN 11.9 10/18/2014   PROLACTIN 24.1* 09/18/2014   PROLACTIN 1312.0* 07/18/2014   . HYPOGONADISM: Despite improvement in prolactin his testosterone level is still significantly low He thinks he has some improvement in his libido and does not complain of fatigue He thinks that he may not be developing enough muscle mass currently with his exercise regimen  Not keen on starting any testosterone supplements at this time   Lab Results  Component Value Date   TESTOSTERONE 103* 01/05/2015    PROBLEM 3:  DIABETES type II with obesity    He was diagnosed to have this in 2014  and has been managed by a primary care physician who he  works with. His A1c has been checked periodically and has been usually in the 8-12% range previously On admission to the hospital his glucose was 380 without any evidence of ketosis   He has seen the dietitian and is following and exercise program for about 30 minutes at a time day and and doing cardio 3 days a week and rates on the other days However his weight has leveled off  Because of inadequate control and A1c of 10.6 he was also started on Invokana 100 mg daily in 10/16 He says he could not tolerate this because of feeling of weakness and excessive urination and initially took this only  every 3 days and has gradually increased the dose and finally is up to 1 tablet daily for the last 10 days or so  Although he did not bring his monitor for download he thinks his blood sugars are improving  Checking blood sugars before breakfast which are usually in the 135-145 range fasting and 150-175 after meals No hypoglycemia with glipizide which is now 1 tablet daily of the 5 mg ER No recent labs available  Weight history:  Wt Readings from Last 3 Encounters:  03/02/15 257 lb 3.2 oz (116.665 kg)  01/05/15 257 lb 9.6 oz (116.847 kg)  09/22/14 266 lb (120.657 kg)    Lab Results  Component Value Date   HGBA1C 10.6* 01/05/2015   HGBA1C 9.9* 10/18/2014   HGBA1C 12.1* 07/18/2014   Lab Results  Component Value Date   MICROALBUR 77.0* 01/05/2015  [place] 60 10/18/2014   CREATININE 1.13 10/18/2014      Past Medical History  Diagnosis Date  . Diabetes mellitus without complication ([place])   . Hypertension     No past surgical history on file.  Family History  Problem Relation Age of Onset  . Cancer Other   . Hypertension Other   . Diabetes Mother   . Diabetes Father   . Heart disease Neg Hx     Social History:  reports that he has never smoked. He has never used smokeless tobacco. He reports that he does not drink alcohol or use illicit drugs.  Allergies: No Known  Allergies    Medication List       This list is accurate as of: 03/02/15 11:59 PM.  Always use your most recent med list.               amLODipine 10 MG tablet  Commonly known as:  NORVASC  Take 10 mg by mouth daily.     aspirin EC 81 MG tablet  Take 81 mg by mouth daily.     cabergoline 0.5 MG tablet  Commonly known as:  DOSTINEX  TAKE 1 TABLET TWICE A WEEK     canagliflozin 100 MG Tabs tablet  Commonly known as:  INVOKANA  Take 1 tablet (100 mg total) by mouth daily.     glipiZIDE 5 MG 24 hr tablet  Commonly known as:  GLUCOTROL XL  TAKE 2 TABLETS EVERY DAY WITH BREAKFAST     isosorbide mononitrate 60 MG 24 hr tablet  Commonly known as:  IMDUR  Take 60 mg by mouth daily.     lisinopril 10 MG tablet  Commonly known as:  PRINIVIL,ZESTRIL  Take 1 tablet (10 mg total) by mouth 2 (two) times daily.     metFORMIN 500 MG 24 hr tablet  Commonly known as:  GLUCOPHAGE-XR  TAKE 4 TABLETS EVERY DAY     metoprolol succinate 50 MG 24 hr tablet  Commonly known as:  TOPROL-XL  Take 50 mg by mouth daily. Take with or immediately following a meal.        LABS:  No visits with results within 1 Week(s) from this visit. Latest known visit with results is:  Office Visit on 01/05/2015  Component Date Value Ref Range Status  . Testosterone 01/05/2015 103* 348 - 1197 ng/dL Final  . Comment, Testosterone 01/05/2015 Comment   Final   Comment: Adult male reference interval is based on a population of lean males up to 56 years old.   . Prolactin 01/05/2015 12.7  4.0 - 15.2 ng/mL Final  . Microalb, Ur 01/05/2015 77.0* 0.0 - 1.9 mg/dL Final  . Creatinine,U 01/05/2015 85.2   Final  . Microalb Creat Ratio 01/05/2015 90.3* 0.0 - 30.0 mg/g Final  . Hgb A1c MFr Bld 01/05/2015 10.6* 4.6 - 6.5 % Final   Glycemic Control Guidelines for People with Diabetes:Non Diabetic:  <6%Goal of Therapy: <7%Additional Action Suggested:  >8%      REVIEW OF SYSTEMS:         Eyes: No known  diabetic retinopathy, has had macular edema, followed regularly by ophthalmologist   Hypertension: has had a long history of high blood pressure  Currently on regimen of lisinopril, amlodipine and low dose metoprolol Blood pressure was high on his last visit, also taking Invokana now  He thinks his blood pressure is  controlled and he is checking this regularly No history of problems with hypokalemia  Home blood pressure readings: 125-145/75-90  Lab Results  Component Value Date   CREATININE 1.13 10/18/2014   BUN 31* 10/18/2014   NA 138 10/18/2014   K 4.2 10/18/2014   CL 96* 10/18/2014   CO2 20 10/18/2014    Diabetic foot exam in 5/16 showed no abnormality except onychomycosis and absent vibration sense     PHYSICAL EXAM:  BP 130/80 mmHg  Pulse 78  Temp(Src) 98.5 F (36.9 C)  Resp 14  Ht 6' (1.829 m)  Wt 257 lb 3.2 oz (116.665 kg)  BMI 34.87 kg/m2  SpO2 96%    ASSESSMENT:   Macroprolactinoma of the pituitary gland with very high baseline prolactin level of 1315 Also has had symptoms of hypogonadism which appear to be better now. He has done very well with starting Dostinex and titrating up to 0.5 mg twice a week, prolactin has come down to normal No recent difficulties with visual fields or headaches Needs repeat prolactin level and follow-up MRI  HYPOGONADISM: This may be related to metabolic syndrome as testosterone was only 103 on his last visit despite improvement in prolactin.  Since he is not symptomatic he wants to wait for any testosterone supplementation He thinks that he may not be developing enough muscle mass currently with his exercise regimen   DIABETES with mild obesity and poor control.  No known complications Home blood sugars are reportedly mildly increased both morning and postprandial but no monitor download available Currently on maximum dose metformin ER and glipizide ER 5 mg Blood sugars are improving with adding Invokana with home sugars  but need to have an A1c rechecked  He is doing fairly well his exercise regimen but has not lost any weight  PLAN:   Pituitary tumor:  Prolactin will need to be repeated to ensure stability on current regimen  Will also need to order pituitary MRI to assess change in size of the tumor since original diagnosis  Also will need to follow testosterone levels to see if it improving  Visual fields to be assessed by ophthalmologist and followed closely  For diabetes:   Continue checking blood sugars 2 hours after meals and bring monitor for download on each visit  Continue same dose of Invokana, may increase it to only if blood sugars are not controlled, he did have difficulty adjusting to this because of significant volume loss with this initially   Consider reducing or stopping glipizide ER when blood sugars are low normal  Check A1c   Recheck lipids, Currently not on treatment  HYPERTENSION: with amlodipine and lisinopril the blood pressure is  improved now likely to be from adding Invokana He reports better blood pressure readings at home Also need to be rechecking  creatinine especially with starting Invokana  Follow-up in   3 months   Patient Instructions  Check blood sugars on waking up   times a week Also check blood sugars about 2 hours after a meal and do this after different meals by rotation  Recommended blood sugar levels on waking up is 90-130 and about 2 hours after meal is 130-160  Please bring your blood sugar monitor to each visit, thank you    Counseling time on subjects discussed above is over 50% of today's 25 minute visit  Stefano Trulson 03/03/2015, 2:38 PM   Note: This office note was prepared with Dragon voice recognition system technology. Any transcriptional errors that result from this process are unintentional.

## 2015-03-02 NOTE — Patient Instructions (Signed)
Check blood sugars on waking up   times a week Also check blood sugars about 2 hours after a meal and do this after different meals by rotation  Recommended blood sugar levels on waking up is 90-130 and about 2 hours after meal is 130-160  Please bring your blood sugar monitor to each visit, thank you

## 2015-03-12 ENCOUNTER — Other Ambulatory Visit: Payer: Self-pay | Admitting: Endocrinology

## 2015-03-23 ENCOUNTER — Encounter: Payer: Self-pay | Admitting: Endocrinology

## 2015-04-12 ENCOUNTER — Telehealth: Payer: Self-pay | Admitting: Endocrinology

## 2015-04-12 NOTE — Telephone Encounter (Signed)
Baker Janus from [facility] stated patient Brain MRI with and with out [phone], authorization has expired, Patient appointment is Sat 14 th,  Phone # [phone]

## 2015-04-13 NOTE — Telephone Encounter (Signed)
Spoke with Baker Janus and advised that she call [place] at [phone].

## 2015-04-14 ENCOUNTER — Inpatient Hospital Stay: Admission: RE | Admit: 2015-04-14 | Payer: BLUE CROSS/BLUE SHIELD | Source: Ambulatory Visit

## 2015-04-21 ENCOUNTER — Inpatient Hospital Stay: Admission: RE | Admit: 2015-04-21 | Payer: BLUE CROSS/BLUE SHIELD | Source: Ambulatory Visit

## 2015-04-28 ENCOUNTER — Inpatient Hospital Stay: Admission: RE | Admit: 2015-04-28 | Payer: BLUE CROSS/BLUE SHIELD | Source: Ambulatory Visit

## 2015-05-04 ENCOUNTER — Other Ambulatory Visit: Payer: Self-pay | Admitting: Endocrinology

## 2015-05-12 ENCOUNTER — Ambulatory Visit
Admission: RE | Admit: 2015-05-12 | Discharge: 2015-05-12 | Disposition: A | Discharge status: Home / Self Care | Payer: BLUE CROSS/BLUE SHIELD | Source: Ambulatory Visit | Attending: Endocrinology | Admitting: Endocrinology

## 2015-05-12 DIAGNOSIS — D352 Benign neoplasm of pituitary gland: Secondary | ICD-10-CM

## 2015-05-12 MED ORDER — GADOBENATE DIMEGLUMINE 529 MG/ML IV SOLN
10.0000 mL | Freq: Once | INTRAVENOUS | Status: AC | PRN
  Administered 2015-05-12: 10 mL via INTRAVENOUS

## 2015-05-13 NOTE — Progress Notes (Signed)
Quick Note:  Please let patient know that pituitary tumor size is now 15 mm compared to 23 mm before, improved ______

## 2015-06-03 ENCOUNTER — Other Ambulatory Visit: Payer: Self-pay | Admitting: Endocrinology

## 2015-06-08 ENCOUNTER — Other Ambulatory Visit: Payer: Self-pay | Admitting: Endocrinology

## 2015-06-22 ENCOUNTER — Encounter: Payer: Self-pay | Admitting: Dietician

## 2015-06-22 ENCOUNTER — Encounter: Payer: BLUE CROSS/BLUE SHIELD | Attending: Endocrinology | Admitting: Dietician

## 2015-06-22 ENCOUNTER — Ambulatory Visit (INDEPENDENT_AMBULATORY_CARE_PROVIDER_SITE_OTHER): Payer: BLUE CROSS/BLUE SHIELD | Admitting: Endocrinology

## 2015-06-22 VITALS — Wt 255.0 lb

## 2015-06-22 VITALS — BP 138/88 | HR 83 | Temp 97.7°F | Resp 14 | Ht 74.0 in | Wt 255.6 lb

## 2015-06-22 DIAGNOSIS — D352 Benign neoplasm of pituitary gland: Secondary | ICD-10-CM | POA: Diagnosis not present

## 2015-06-22 DIAGNOSIS — E669 Obesity, unspecified: Secondary | ICD-10-CM

## 2015-06-22 DIAGNOSIS — E118 Type 2 diabetes mellitus with unspecified complications: Secondary | ICD-10-CM

## 2015-06-22 DIAGNOSIS — E1165 Type 2 diabetes mellitus with hyperglycemia: Secondary | ICD-10-CM | POA: Insufficient documentation

## 2015-06-22 DIAGNOSIS — R7989 Other specified abnormal findings of blood chemistry: Secondary | ICD-10-CM

## 2015-06-22 DIAGNOSIS — E291 Testicular hypofunction: Secondary | ICD-10-CM

## 2015-06-22 DIAGNOSIS — I10 Essential (primary) hypertension: Secondary | ICD-10-CM | POA: Diagnosis not present

## 2015-06-22 LAB — POCT GLYCOSYLATED HEMOGLOBIN (HGB A1C): HEMOGLOBIN A1C: 7.1

## 2015-06-22 NOTE — Progress Notes (Signed)
Medical Nutrition Therapy:  Appt start time: [id number] end time:  1715.   Assessment:  09/22/14 Primary concerns today: Patient is here alone.  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  He was diagnosed with diabetes about 2 years ago but was in denial at that time.  States that he continued to work long hours, eat out a lot, drink regular soda, and did not have time for exercise.  HgBA1C 12.1 (07/18/14).  Hx also includes a prolactin secreting pituitary adenoma, benign tumor of pituitary gland with compression on the optic nerve and HTN.  He reports taking his medication.  Weight today 266 lbs (adjusted to 260 lbs due to steal toed boots)  States that this is increased from 224 lbs 3 weeks ago at this office which he things was an error.  He is currently trying to lose weight and has made many dietary changes avoiding most carbs   He wishes to be weighed on the Body Composition scale on next visit.    Patient lives with wife. 2 children in college. Travels between multiple homes.  Eats out frequently.    10/17/14: Patient is here alone.  States that he has tried to increase carbs slightly but thinks that this in contributing to fluid retention.  He is also concerned that increased Prolactin and increased Metformin is contributing to this as well.  He wants to resume a low carb, high protein diet.  Weight is unchanged overall although different scales.  Patient has not found the time to exercise,  And sleeps only 4-5 hours per night which he has done for years.  He states that the amount of urination has decreased as well.  Discussed concern of decreased caloric intake and decreased metabolism preventing weight loss.  "If I just have the self control and eat less, I will lose weight."  TANITA  BODY COMP RESULTS (?acuracy in this pt) 10/17/14 261   BMI (kg/m^2) 35.4   Fat Mass (lbs) 52.5   Fat Free Mass (lbs) 208.5   Total Body Water  (lbs) 152.5   06/22/15: Patient is here alone.  He has been exercising 3x/week- 45 minutes of vigorous walking with 10 lbs of weights on each side.  He plans to increase this to 5 times per week.  He continues to follow the [employer].  His weight has decreased to 255 lbs today in the past 6 months.  He has cut back on portions.  States that his goal is below 250 lbs by July.  His blood pressure has reduced and A1C decreased to 7.4% today from 10.6% 01/05/15.  He continues to work and travel and only gets about 4 hours sleep per night.  He complains of hunger at night.  Eats an Atkins bar but this makes him crave more.  Preferred Learning Style:   No preference indicated   Learning Readiness:   Ready  Change in progress   MEDICATIONS: see list to include:  Metformin and Glucotrol   DIETARY INTAKE: Recently patient changed to avoid bread and other higher carbs as well as eliminating regular soda and sweets.  Patient reports that he was  "in denial about the diabetes" prior to recent hospitalization.  24-hr recall: 2016 B ( AM): 1 patty of sausage and 1 scrambled egg, 1 slice [employer] bread  Snk ( AM): none  L ( PM): fruit and salad with protein Snk ( PM):  none D ( PM): Salmon and 2 vegetables ([employer]) Snk ( PM): none Beverages: water, occasional diet coke  Usual physical activity: walking 1/2 mile every 3rd day. See above for increased changes.  Estimated energy needs: 2000 calories 225 g carbohydrates 150 g protein 56 g fat  Progress Towards Goal(s):  In progress.   Nutritional Diagnosis:  NB-1.1 Food and nutrition-related knowledge deficit As related to balance of carbohydrate, protein, and fat.  As evidenced by diet hx..    Intervention:  Nutrition counseling and diabetes education continued.  Weight discussed with concern of low calorie intake decreasing metabolism.  Patient is concerned that increasing carb intake slightly has increased fluid retention and prevented weight  loss.  We now have the body composition scale results and can use this as a comparison for other visits.  Patient prefers to follow a more strict low carb diet.  Patient is concerned about water retention.  Discussed sodium awareness of foods when eating out.   We discussed the implication of inadequate sleep, hormone balance, and increased inflammation, and inactivity on weight as well.  Nutrition to decrease inflammation discussed briefly.  Patient wants to try the high protein diet for another month.  06/22/15 Discussed improvement and patient's questions.  Discussed sodium as related to eating out.  Discussed nuts as an option instead of the [employer] bar some nights.  Discussed patient's questions regarding plant based eating.  Consider pedometer app.  10,000 steps is a general goal Consider Calorie [employer] app.  Inform yourself about the food you are eating (especially out to eat). Be mindful of your sodium intake. Increase physical activity as able.  Walking is a good choice.  Teaching Method Utilized:  Visual Auditory Hands on  Handouts given during visit include:  Body Composition scale results  Barriers to learning/adherence to lifestyle change: increased traveling  Demonstrated degree of understanding via:  Teach Back   Monitoring/Evaluation:  Dietary intake, exercise, label reading, and body weight 3 months or prn per patient choice.

## 2015-06-22 NOTE — Progress Notes (Signed)
Patient ID: Roberto [name], male   DOB: 01/25/1961, 55 y.o.   MRN: [id number]   Chief complaint: Follow-up of multiple endocrine problems  History of Present Illness:  PROBLEM 1: Pituitary tumor  His MRI on 07/18/14 showed a pituitary tumor with the following description: 2.3 x 2 x 1.8 cm mass centered in the sella with suprasellar extension and cavernous sinus extension greater on the left with an appearance most suggestive of pituitary macroadenoma. This impresses upon the optic chiasm  Had difficulty with peripheral vision on the left side but this has resolved He being followed by ophthalmologist periodically for this and macular edema  Has has had a follow-up MRI in 2/17 showing focal area of delayed enhancement this represents a pituitary adenoma on the left. This lesion measures 15 x 12 x 11 mm. There is deviation of the pituitary stalk to the right. There is no definite cavernous sinus invasion. The lesion does not contact the optic Chiasm   PROBLEM 2:  Hyperprolactinemia:  On evaluation for his pituitary tumor in the hospital he had a prolactin level checked and this was markedly increased at 1312 He had symptoms of decreased libido and gynecomastia Testosterone level was low at 70 He has had normal thyroid levels  He  was started on Dostinex 0.5 mg initially half tablet twice a week and then 1 tablet twice a week on his initial consultation on 08/04/14 He is tolerating the Dostinex well. Prolactin has been consistently normal since 7/16 Follow-up level pending  Lab Results  Component Value Date   PROLACTIN 12.7 01/05/2015   PROLACTIN 11.9 10/18/2014   PROLACTIN 24.1* 09/18/2014   PROLACTIN 1312.0* 07/18/2014   . HYPOGONADISM: Despite improvement in prolactin his Last testosterone level was still significantly low He has had improvement in his libido and does not complain of fatigue  Not keen on starting any testosterone supplements   Follow-up level  pending   Lab Results  Component Value Date   TESTOSTERONE 103* 01/05/2015    PROBLEM 3:  DIABETES type II with obesity    He was diagnosed to have this in 2014  and has been managed by a primary care physician who he works with. His A1c has been checked periodically and has been usually in the 8-12% range previously On admission to the hospital his glucose was 380 without any evidence of ketosis   He has seen the dietitian and is following and exercise program for about 30 minutes doing cardio and also weights  3 days a week   Because of inadequate control and A1c of 10.6 he was also started on Invokana 100 mg daily in 10/16 He had no symptoms of increased urination now  Although he did not bring his monitor for download he thinks his blood sugars are as follows:  135-145 range fasting and 150-175 after meals  No hypoglycemia with glipizide which is now 1 tablet daily of the 5 mg ER A1c is significantly improved at 7.1 now Has not lost any weight  Weight history:  Wt Readings from Last 3 Encounters:  06/22/15 255 lb 9.6 oz (115.939 kg)  06/22/15 255 lb (115.667 kg)  03/02/15 257 lb 3.2 oz (116.665 kg)    Lab Results  Component Value Date   HGBA1C 7.1 06/22/2015   HGBA1C 10.6* 01/05/2015   HGBA1C 9.9* 10/18/2014   Lab Results  Component Value Date   MICROALBUR 77.0* 01/05/2015   LDLCALC 60 10/18/2014   CREATININE 1.13 10/18/2014  OTHER active problems and review of systems    Past Medical History  Diagnosis Date  . Diabetes mellitus without complication ([place])   . Hypertension     No past surgical history on file.  Family History  Problem Relation Age of Onset  . Cancer Other   . Hypertension Other   . Diabetes Mother   . Diabetes Father   . Heart disease Neg Hx     Social History:  reports that he has never smoked. He has never used smokeless tobacco. He reports that he does not drink alcohol or use illicit drugs.  Allergies: No Known  Allergies    Medication List       This list is accurate as of: 06/22/15 11:59 PM.  Always use your most recent med list.               amLODipine 10 MG tablet  Commonly known as:  NORVASC  Take 10 mg by mouth daily.     aspirin EC 81 MG tablet  Take 81 mg by mouth daily.     cabergoline 0.5 MG tablet  Commonly known as:  DOSTINEX  TAKE 1 TABLET TWICE A WEEK     glipiZIDE 5 MG 24 hr tablet  Commonly known as:  GLUCOTROL XL  TAKE 2 TABLETS EVERY DAY WITH BREAKFAST     INVOKANA 100 MG Tabs tablet  Generic drug:  canagliflozin  TAKE 1 TABLET (100 MG TOTAL) BY MOUTH DAILY.     isosorbide mononitrate 60 MG 24 hr tablet  Commonly known as:  IMDUR  Take 60 mg by mouth daily.     lisinopril 10 MG tablet  Commonly known as:  PRINIVIL,ZESTRIL  Take 1 tablet (10 mg total) by mouth 2 (two) times daily.     metFORMIN 500 MG 24 hr tablet  Commonly known as:  GLUCOPHAGE-XR  TAKE 4 TABLETS EVERY DAY     metoprolol succinate 50 MG 24 hr tablet  Commonly known as:  TOPROL-XL  Take 50 mg by mouth daily. Take with or immediately following a meal.        LABS:  Office Visit on 06/22/2015  Component Date Value Ref Range Status  . Hemoglobin A1C 06/22/2015 7.1   Final     REVIEW OF SYSTEMS:         Eyes: No known diabetic retinopathy, has had macular edema, followed regularly by ophthalmologist   Hypertension: has had a long history of high blood pressure  Currently on regimen of10 mg lisinopril, amlodipine and low dose metoprolol Blood pressure was high on his last visit and still relatively high in the office His also taking Invokana now  No history of problems with hypokalemia    Home blood pressure readings:  130-149/75-89  Lab Results  Component Value Date   CREATININE 1.13 10/18/2014   BUN 31* 10/18/2014   NA 138 10/18/2014   K 4.2 10/18/2014   CL 96* 10/18/2014   CO2 20 10/18/2014   Neuropathy: He has only occasional burning sensations, no significant  sharp pains now   Diabetic foot exam in 3/17  showed no abnormality     PHYSICAL EXAM:  BP 138/88 mmHg  Pulse 83  Temp(Src) 97.7 F (36.5 C)  Resp 14  Ht 6\' 2"  (1.88 m)  Wt 255 lb 9.6 oz (115.939 kg)  BMI 32.80 kg/m2  SpO2 94%  1+ lower leg edema present  Diabetic Foot Exam - Simple   Simple Foot Form  Diabetic  Foot exam was performed with the following findings:  Yes   Visual Inspection  No deformities, no ulcerations, no other skin breakdown bilaterally:  Yes  Sensation Testing  Intact to touch and monofilament testing bilaterally:  Yes  Pulse Check  Posterior Tibialis and Dorsalis pulse intact bilaterally:  Yes  Comments       ASSESSMENT:   Macroprolactinoma of the pituitary gland with very high baseline prolactin level of 1315 Also has had symptoms of hypogonadism which appear to be resolving He has done very well with starting Dostinex and titrating up to 0.5 mg twice a week, prolactin has been down to normal No recent difficulties with visual fields or headaches and MRI shows significant reduction in tumor size Needs repeat prolactin level   HYPOGONADISM: This may be related to metabolic syndrome as testosterone was only 103 on his last visit despite improvement in prolactin.   Needs fasting testosterone level rechecked Currently is not symptomatic and has been reluctant to take any supplements   DIABETES with mild obesity and previously persistently poor control.  No known complications Home blood sugars are mildly increased both morning and postprandial but no monitor download available again A1c however excellent at 7.1 compared to previous readings  Currently on maximum dose metformin ER, Invokana 100 mg and glipizide ER 5 mg He is doing fairly well his exercise regimen but has not lost any weight  HYPERTENSION: This is not adequately controlled.  Also has a history of increasing microalbumin which needs follow-up  EDEMA: Still tends to have some  dependent edema presumably partly from Norvasc  PLAN:   Pituitary tumor:  Prolactin will need to be repeated to ensure stability on current regimen of Dostinex 0.5 mg twice a week Probably  need to be on treatment long-term  Also will need to follow testosterone levels to see if it is back to normal, may still be low from his metabolic syndrome  He will also reassess IGF-I and thyroid function, previously IGF-I not checked  For diabetes:   Reminded to be checking blood sugars 2 hours after meals and bring monitor for download on each visit  Continue same dose of Invokana, may increase it if not planning to increase his lisinopril for proteinuria  Recheck lipids, Currently not on treatment  HYPERTENSION: with amlodipine and lisinopril the blood pressure is still not optimal especially in the office He reports better blood pressure readings at home If his microalbumin is normal will increase Invokana otherwise needs increased Prinivil or both especially with tendency with his edema  Follow-up in  3 months   Patient Instructions  Check blood sugars on waking up 3  times a week Also check blood sugars about 2 hours after a meal and do this after different meals by rotation  Recommended blood sugar levels on waking up is 90-130 and about 2 hours after meal is 130-160  Please bring your blood sugar monitor to each visit, thank you      Counseling time on subjects discussed above is over 50% of today's 25 minute visit  Carlean Crowl 06/24/2015, 8:57 PM   Note: This office note was prepared with Dragon voice recognition system technology. Any transcriptional errors that result from this process are unintentional.

## 2015-06-22 NOTE — Patient Instructions (Signed)
Check blood sugars on waking up 3  times a week  Also check blood sugars about 2 hours after a meal and do this after different meals by rotation  Recommended blood sugar levels on waking up is 90-130 and about 2 hours after meal is 130-160  Please bring your blood sugar monitor to each visit, thank you  

## 2015-06-29 ENCOUNTER — Other Ambulatory Visit: Payer: BLUE CROSS/BLUE SHIELD

## 2015-07-02 ENCOUNTER — Other Ambulatory Visit: Payer: Self-pay | Admitting: Endocrinology

## 2015-07-02 ENCOUNTER — Other Ambulatory Visit (INDEPENDENT_AMBULATORY_CARE_PROVIDER_SITE_OTHER): Payer: BLUE CROSS/BLUE SHIELD

## 2015-07-02 DIAGNOSIS — D539 Nutritional anemia, unspecified: Secondary | ICD-10-CM

## 2015-07-02 DIAGNOSIS — D352 Benign neoplasm of pituitary gland: Secondary | ICD-10-CM

## 2015-07-02 DIAGNOSIS — IMO0001 Reserved for inherently not codable concepts without codable children: Secondary | ICD-10-CM

## 2015-07-02 DIAGNOSIS — E1165 Type 2 diabetes mellitus with hyperglycemia: Secondary | ICD-10-CM | POA: Diagnosis not present

## 2015-07-02 LAB — COMPREHENSIVE METABOLIC PANEL
ALBUMIN: 4 g/dL (ref 3.5–5.2)
ALK PHOS: 88 U/L (ref 39–117)
ALT: 18 U/L (ref 0–53)
AST: 23 U/L (ref 0–37)
BILIRUBIN TOTAL: 0.4 mg/dL (ref 0.2–1.2)
BUN: 39 mg/dL — AB (ref 6–23)
CO2: 26 mEq/L (ref 19–32)
CREATININE: 1.37 mg/dL (ref 0.40–1.50)
Calcium: 9.6 mg/dL (ref 8.4–10.5)
Chloride: 104 mEq/L (ref 96–112)
GFR: 68.87 mL/min (ref >60.00)
Glucose, Bld: 215 mg/dL — ABNORMAL HIGH (ref 70–99)
POTASSIUM: 4.1 meq/L (ref 3.5–5.1)
SODIUM: 138 meq/L (ref 135–145)
TOTAL PROTEIN: 7.7 g/dL (ref 6.0–8.3)

## 2015-07-02 LAB — CBC WITH DIFFERENTIAL/PLATELET
Basophils Absolute: 0 10*3/uL (ref 0.0–0.1)
Basophils Relative: 0.6 % (ref 0.0–3.0)
EOS ABS: 0.3 10*3/uL (ref 0.0–0.7)
Eosinophils Relative: 5.6 % — ABNORMAL HIGH (ref 0.0–5.0)
HCT: 32.7 % — ABNORMAL LOW (ref 39.0–52.0)
HEMOGLOBIN: 10.8 g/dL — AB (ref 13.0–17.0)
Lymphocytes Relative: 26.8 % (ref 12.0–46.0)
Lymphs Abs: 1.4 10*3/uL (ref 0.7–4.0)
MCHC: 32.9 g/dL (ref 30.0–36.0)
MCV: 85.9 fl (ref 78.0–100.0)
MONO ABS: 0.3 10*3/uL (ref 0.1–1.0)
Monocytes Relative: 6.8 % (ref 3.0–12.0)
Neutro Abs: 3.1 10*3/uL (ref 1.4–7.7)
Neutrophils Relative %: 60.2 % (ref 43.0–77.0)
Platelets: 195 10*3/uL (ref 150.0–400.0)
RBC: 3.81 Mil/uL — ABNORMAL LOW (ref 4.22–5.81)
RDW: 13.9 % (ref 11.5–15.5)
WBC: 5.1 10*3/uL (ref 4.0–10.5)

## 2015-07-02 LAB — LIPID PANEL
CHOLESTEROL: 111 mg/dL (ref 0–200)
HDL: 24.8 mg/dL — ABNORMAL LOW (ref >39.00)
LDL Cholesterol: 49 mg/dL (ref 0–99)
NonHDL: 86.19
Total CHOL/HDL Ratio: 4
Triglycerides: 186 mg/dL — ABNORMAL HIGH (ref 0.0–149.0)
VLDL: 37.2 mg/dL (ref 0.0–40.0)

## 2015-07-02 LAB — T4, FREE: Free T4: 0.76 ng/dL (ref 0.60–1.60)

## 2015-07-02 LAB — VITAMIN B12: VITAMIN B 12: 240 pg/mL (ref 211–911)

## 2015-07-03 LAB — TESTOSTERONE, FREE, TOTAL, SHBG
SEX HORMONE BINDING: 32.9 nmol/L (ref 19.3–76.4)
TESTOSTERONE FREE: 10.8 pg/mL (ref 7.2–24.0)
TESTOSTERONE: 197 ng/dL — AB (ref 348–1197)

## 2015-07-03 LAB — PROLACTIN: Prolactin: 7.2 ng/mL (ref 4.0–15.2)

## 2015-07-03 LAB — INSULIN-LIKE GROWTH FACTOR: INSULIN LIKE GF 1: 236 ng/mL — AB (ref 54–194)

## 2015-07-04 NOTE — Progress Notes (Signed)
Quick Note:  Please let patient know results: Hemoglobin is 10.8, prolactin is 7.2, excellent, glucose was 215, LDL cholesterol 49, testosterone improved at 197, pituitary gland functions okay and B12 normal Needs to see a PCP for anemia, no medication change needed ______

## 2015-08-02 DIAGNOSIS — E113312 Type 2 diabetes mellitus with moderate nonproliferative diabetic retinopathy with macular edema, left eye: Secondary | ICD-10-CM | POA: Diagnosis not present

## 2015-08-02 DIAGNOSIS — H2513 Age-related nuclear cataract, bilateral: Secondary | ICD-10-CM | POA: Diagnosis not present

## 2015-08-02 DIAGNOSIS — H35033 Hypertensive retinopathy, bilateral: Secondary | ICD-10-CM | POA: Diagnosis not present

## 2015-08-06 DIAGNOSIS — E1136 Type 2 diabetes mellitus with diabetic cataract: Secondary | ICD-10-CM | POA: Diagnosis not present

## 2015-08-06 DIAGNOSIS — H35033 Hypertensive retinopathy, bilateral: Secondary | ICD-10-CM | POA: Diagnosis not present

## 2015-08-06 DIAGNOSIS — E113312 Type 2 diabetes mellitus with moderate nonproliferative diabetic retinopathy with macular edema, left eye: Secondary | ICD-10-CM | POA: Diagnosis not present

## 2015-08-06 DIAGNOSIS — I10 Essential (primary) hypertension: Secondary | ICD-10-CM | POA: Diagnosis not present

## 2015-08-06 DIAGNOSIS — E113313 Type 2 diabetes mellitus with moderate nonproliferative diabetic retinopathy with macular edema, bilateral: Secondary | ICD-10-CM | POA: Diagnosis not present

## 2015-09-02 ENCOUNTER — Other Ambulatory Visit: Payer: Self-pay | Admitting: Endocrinology

## 2015-09-10 ENCOUNTER — Telehealth: Payer: Self-pay

## 2015-09-10 MED ORDER — CANAGLIFLOZIN 300 MG PO TABS
300.0000 mg | ORAL_TABLET | Freq: Every day | ORAL | Status: DC
Start: 2015-09-10 — End: 2015-12-22

## 2015-09-10 NOTE — Telephone Encounter (Signed)
Rx submitted

## 2015-09-10 NOTE — Telephone Encounter (Signed)
Received a fax from the pharmacy for a invokanna refill. On the fax the pharmacy stated the dosage has increased from 1 100mg  tab daily to 2 100mg  tabs daily. I reviewed last office note and could not located where the dosage had been increased please advise, Thanks!

## 2015-09-10 NOTE — Telephone Encounter (Signed)
Send new prescription for Invokana 300 mg daily

## 2015-09-15 ENCOUNTER — Other Ambulatory Visit: Payer: Self-pay | Admitting: Endocrinology

## 2015-09-17 ENCOUNTER — Other Ambulatory Visit: Payer: BLUE CROSS/BLUE SHIELD

## 2015-09-21 ENCOUNTER — Ambulatory Visit: Payer: BLUE CROSS/BLUE SHIELD | Admitting: Endocrinology

## 2015-09-21 ENCOUNTER — Encounter: Payer: BLUE CROSS/BLUE SHIELD | Admitting: Dietician

## 2015-10-16 ENCOUNTER — Other Ambulatory Visit: Payer: Self-pay | Admitting: Endocrinology

## 2015-10-16 ENCOUNTER — Other Ambulatory Visit (INDEPENDENT_AMBULATORY_CARE_PROVIDER_SITE_OTHER): Payer: BLUE CROSS/BLUE SHIELD

## 2015-10-16 DIAGNOSIS — E1165 Type 2 diabetes mellitus with hyperglycemia: Secondary | ICD-10-CM

## 2015-10-16 DIAGNOSIS — D352 Benign neoplasm of pituitary gland: Secondary | ICD-10-CM

## 2015-10-16 LAB — TESTOSTERONE: TESTOSTERONE: 259.82 ng/dL — AB (ref 300.00–890.00)

## 2015-10-16 LAB — BASIC METABOLIC PANEL
BUN: 30 mg/dL — ABNORMAL HIGH (ref 6–23)
CALCIUM: 9.2 mg/dL (ref 8.4–10.5)
CO2: 27 mEq/L (ref 19–32)
Chloride: 104 mEq/L (ref 96–112)
Creatinine, Ser: 1.59 mg/dL — ABNORMAL HIGH (ref 0.40–1.50)
GFR: 57.94 mL/min — AB (ref >60.00)
Glucose, Bld: 226 mg/dL — ABNORMAL HIGH (ref 70–99)
Potassium: 4 mEq/L (ref 3.5–5.1)
SODIUM: 136 meq/L (ref 135–145)

## 2015-10-16 LAB — T4, FREE: FREE T4: 0.78 ng/dL (ref 0.60–1.60)

## 2015-10-16 LAB — HEMOGLOBIN A1C: HEMOGLOBIN A1C: 7.7 % — AB (ref 4.6–6.5)

## 2015-10-17 LAB — PROLACTIN: PROLACTIN: 5.9 ng/mL (ref 4.0–15.2)

## 2015-10-18 ENCOUNTER — Other Ambulatory Visit: Payer: Self-pay | Admitting: Endocrinology

## 2015-10-18 DIAGNOSIS — E113311 Type 2 diabetes mellitus with moderate nonproliferative diabetic retinopathy with macular edema, right eye: Secondary | ICD-10-CM | POA: Diagnosis not present

## 2015-10-19 ENCOUNTER — Encounter: Payer: BLUE CROSS/BLUE SHIELD | Attending: Endocrinology | Admitting: Dietician

## 2015-10-19 ENCOUNTER — Ambulatory Visit (INDEPENDENT_AMBULATORY_CARE_PROVIDER_SITE_OTHER): Payer: BLUE CROSS/BLUE SHIELD | Admitting: Endocrinology

## 2015-10-19 ENCOUNTER — Other Ambulatory Visit: Payer: BLUE CROSS/BLUE SHIELD

## 2015-10-19 VITALS — Wt 250.0 lb

## 2015-10-19 VITALS — BP 134/86 | HR 77 | Ht 74.0 in | Wt 266.0 lb

## 2015-10-19 DIAGNOSIS — E1165 Type 2 diabetes mellitus with hyperglycemia: Secondary | ICD-10-CM | POA: Diagnosis not present

## 2015-10-19 DIAGNOSIS — D352 Benign neoplasm of pituitary gland: Secondary | ICD-10-CM | POA: Diagnosis not present

## 2015-10-19 DIAGNOSIS — Z713 Dietary counseling and surveillance: Secondary | ICD-10-CM | POA: Insufficient documentation

## 2015-10-19 DIAGNOSIS — D6489 Other specified anemias: Secondary | ICD-10-CM

## 2015-10-19 DIAGNOSIS — I10 Essential (primary) hypertension: Secondary | ICD-10-CM

## 2015-10-19 DIAGNOSIS — E118 Type 2 diabetes mellitus with unspecified complications: Secondary | ICD-10-CM

## 2015-10-19 NOTE — Progress Notes (Signed)
Patient ID: Roberto [name], male   DOB: [birth year], 55 y.o.   MRN: [id number]   Chief complaint: Follow-up of multiple endocrine problems  History of Present Illness:  PROBLEM 1: Pituitary tumor  His MRI on 07/18/14 showed a pituitary tumor with the following description: 2.3 x 2 x 1.8 cm mass centered in the sella with suprasellar extension and cavernous sinus extension greater on the left with an appearance most suggestive of pituitary macroadenoma. This impresses upon the optic chiasm  Had difficulty with peripheral vision on the left side but this has resolved He being followed by ophthalmologist periodically for this and macular edema  Has has had a follow-up MRI in 2/17 showing focal area of delayed enhancement this represents a pituitary adenoma on the left. This lesion measures 15 x 12 x 11 mm. There is deviation of the pituitary stalk to the right. There is no definite cavernous sinus invasion. The lesion does not contact the optic Chiasm   PROBLEM 2:  Hyperprolactinemia:  On evaluation for his pituitary tumor in the hospital he had a prolactin level checked and this was markedly increased at 1312 He had symptoms of decreased libido and gynecomastia Testosterone level was low at 70 He has had normal thyroid levels  He  was started on Dostinex 0.5 mg initially half tablet twice a week and then 1 tablet twice a week on his initial consultation on 08/04/14 He is tolerating the Dostinex well. Prolactin has been consistently normal since 7/16 Follow-up level  Is now  low-normal  Lab Results  Component Value Date   PROLACTIN 5.9 10/16/2015   PROLACTIN 7.2 07/02/2015   PROLACTIN 12.7 01/05/2015   PROLACTIN 11.9 10/18/2014   PROLACTIN 24.1* 09/18/2014   . HYPOGONADISM: Despite improvement in prolactin his Testosterone levels tend to be still relatively low He has had improvement in his libido and does not complain of fatigue currently  Not keen on starting any  testosterone supplements and testosterone level is gradually improving, the last lab was done midafternoon  Follow-up level pending   Lab Results  Component Value Date   TESTOSTERONE 259.82* 10/16/2015    PROBLEM 3:  DIABETES type II with obesity    He was diagnosed  in 2014  and has been Previously managed by a primary care physician who he works with.  His A1c has been checked periodically and has been usually in the 8-12% range previously On admission to the hospital his glucose was 380 without any evidence of ketosis  Current management, blood sugar patterns and problems identified:  Although his A1c was 7.1 on his last visit especially with adding Invokana it is now 7.7  Invokana was increased to 300 mg on his last visit  He has seen the dietitian again today and appears to be needing to make more changes and he thinks his portion control can be better  He is trying to do some exercise with walking or treadmill  Again he did not bring his monitor for download as recommended again on his last visit  No hypoglycemia with glipizide which is now 1 tablet daily of the 5 mg ER   By recall his blood sugars are as follows: 120-130 range fasting and 150-185 after meals   Weight history:  Wt Readings from Last 3 Encounters:  10/19/15 266 lb (120.657 kg)  10/19/15 250 lb (113.399 kg)  06/22/15 255 lb 9.6 oz (115.939 kg)    Lab Results  Component Value Date   HGBA1C 7.7*  10/16/2015   HGBA1C 7.1 06/22/2015   HGBA1C 10.6* 01/05/2015   Lab Results  Component Value Date   MICROALBUR 77.0* 01/05/2015   LDLCALC 49 07/02/2015   CREATININE 1.59* 10/16/2015    OTHER active problems and review of systems    Past Medical History  Diagnosis Date  . Diabetes mellitus without complication ([place])   . Hypertension     No past surgical history on file.  Family History  Problem Relation Age of Onset  . Cancer Other   . Hypertension Other   . Diabetes Mother   .  Diabetes Father   . Heart disease Neg Hx     Social History:  reports that he has never smoked. He has never used smokeless tobacco. He reports that he does not drink alcohol or use illicit drugs.  Allergies: No Known Allergies    Medication List       This list is accurate as of: 10/19/15  4:29 PM.  Always use your most recent med list.               amLODipine 10 MG tablet  Commonly known as:  NORVASC  Take 10 mg by mouth daily.     aspirin EC 81 MG tablet  Take 81 mg by mouth daily.     cabergoline 0.5 MG tablet  Commonly known as:  DOSTINEX  TAKE 1 TABLET TWICE A WEEK     canagliflozin 300 MG Tabs tablet  Commonly known as:  INVOKANA  Take 1 tablet (300 mg total) by mouth daily before breakfast.     glipiZIDE 5 MG 24 hr tablet  Commonly known as:  GLUCOTROL XL  TAKE 2 TABLETS EVERY DAY WITH BREAKFAST     isosorbide mononitrate 60 MG 24 hr tablet  Commonly known as:  IMDUR  Take 60 mg by mouth daily.     lisinopril 10 MG tablet  Commonly known as:  PRINIVIL,ZESTRIL  Take 1 tablet (10 mg total) by mouth 2 (two) times daily.     metFORMIN 500 MG 24 hr tablet  Commonly known as:  GLUCOPHAGE-XR  TAKE 4 TABLETS EVERY DAY     metoprolol succinate 50 MG 24 hr tablet  Commonly known as:  TOPROL-XL  Take 50 mg by mouth daily. Take with or immediately following a meal.        LABS:  Lab on 10/16/2015  Component Date Value Ref Range Status  . Hgb A1c MFr Bld 10/16/2015 7.7* 4.6 - 6.5 % Final   Glycemic Control Guidelines for People with Diabetes:Non Diabetic:  <6%Goal of Therapy: <7%Additional Action Suggested:  >8%   . Sodium 10/16/2015 136  135 - 145 mEq/L Final  . Potassium 10/16/2015 4.0  3.5 - 5.1 mEq/L Final  . Chloride 10/16/2015 104  96 - 112 mEq/L Final  . CO2 10/16/2015 27  19 - 32 mEq/L Final  . Glucose, Bld 10/16/2015 226* 70 - 99 mg/dL Final  . BUN 10/16/2015 30* 6 - 23 mg/dL Final  . Creatinine, Ser 10/16/2015 1.59* 0.40 - 1.50 mg/dL Final  .  Calcium 10/16/2015 9.2  8.4 - 10.5 mg/dL Final  . GFR 10/16/2015 57.94* >60.00 mL/min Final  . Free T4 10/16/2015 0.78  0.60 - 1.60 ng/dL Final  . Testosterone 10/16/2015 259.82* 300.00 - 890.00 ng/dL Final  . Prolactin 10/16/2015 5.9  4.0 - 15.2 ng/mL Final     REVIEW OF SYSTEMS:         Eyes: No known diabetic retinopathy,  has had macular edema, followed regularly by ophthalmologist   Hypertension: has had a long history of high blood pressure  Currently on regimen of10 mg lisinopril, amlodipine 10 mg and low dose metoprolol Blood pressure Tends to be slightly high in the office He thinks diastolic readings are about 80+ when he checks at home  No history of  hypokalemia   Apparently the increase in Invokana has increased his creatinine recently.  He also does not think he is drinking enough fluids   Lab Results  Component Value Date   CREATININE 1.59* 10/16/2015   BUN 30* 10/16/2015   NA 136 10/16/2015   K 4.0 10/16/2015   CL 104 10/16/2015   CO2 27 10/16/2015   Neuropathy: He has only occasional burning sensations, no significant sharp pains    Diabetic foot exam in 3/17  showed no abnormality     PHYSICAL EXAM:  BP 134/86 mmHg  Pulse 77  Ht 6\' 2"  (1.88 m)  Wt 266 lb (120.657 kg)  BMI 34.14 kg/m2  SpO2 95%    ASSESSMENT:   Macroprolactinoma of the pituitary gland with very high baseline prolactin level of 1315 He has done very well with starting Dostinex and Continuing 0.5 mg twice a week, prolactin has been down to normal Randol Kern prolactin levels have been low normal  No recent difficulties with visual fields or headaches and last MRI shows significant reduction in tumor size  HYPOGONADISM: This may be related to metabolic syndrome as testosterone level has lagged behind his prolactin improvement However testosterone level is improving, the last level being nonfasting Needs fasting testosterone level rechecked Currently is not symptomatic with no  fatigue or change in libido  The other pituitary gland functions have been normal so far and free T4 also okay  Renal insufficiency: This may be partly related to increase in Invokana and combining with lisinopril   DIABETES with mild obesity and previously persistently poor control.  No known complications Home blood sugars are mildly increased but he has not done enough readings after meals at home Does not probably checking enough readings after meals as he does not report high readings but his lab glucose was over 200 postprandially A1c is higher at 7.7 despite increasing Invokana  Currently he is reluctant to add another medication However discussed that a GLP-1 drug would would be effective in improving his glucose and replacing glipizide as well as providing weight loss Given him detailed information on GLP-1 drugs, currently only Victoza is covered   Currently on maximum dose metformin ER, Invokana 100 mg and glipizide ER 5 mg He is doing fairly well his exercise regimen but has not lost any weight  HYPERTENSION: This is not adequately controlled.  Also has a history of increasing microalbumin which needs follow-up  EDEMA: Still tends to have some dependent edema presumably partly from Norvasc  PLAN:   Pituitary tumor:  Prolactin is low normal on current regimen of Dostinex 0.5 mg twice a week He will reduce the dose of half tablet twice a week  Needs fasting testosterone levels to see if it is back to normal, may still be low from his metabolic syndrome but currently asymptomatic   For diabetes:   Reminded to be checking blood sugars 2 hours after meals and bring monitor for download on each visit  Continue same dose of Invokana, may increase it if not planning to increase his lisinopril for proteinuria  Recheck lipids, Currently not on treatment  HYPERTENSION: with amlodipine and  lisinopril the blood pressure is still not optimal especially in the office He  reports better blood pressure readings at home If his microalbumin is normal will increase Invokana otherwise needs increased Prinivil or both especially with tendency with his edema  Renal insufficiency: Need to reduce Invokana to half tablet, increase fluid intake and have follow-up labs in one month  Follow-up for office visit in  3 months   There are no Patient Instructions on file for this visit. Counseling time on subjects discussed above is over 50% of today's 25 minute visit  Roberto [name] 10/19/2015, 4:29 PM   Note: This office note was prepared with Estate agent. Any transcriptional errors that result from this process are unintentional.

## 2015-10-19 NOTE — Progress Notes (Signed)
Medical Nutrition Therapy:  Appt start time: [id number] end time:  1715.   Assessment:  09/22/14 Primary concerns today: Patient is here alone.  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  He was diagnosed with diabetes about 2 years ago but was in denial at that time.  States that he continued to work long hours, eat out a lot, drink regular soda, and did not have time for exercise.  HgBA1C 12.1 (07/18/14).  Hx also includes a prolactin secreting pituitary adenoma, benign tumor of pituitary gland with compression on the optic nerve and HTN.  He reports taking his medication.  Weight today 266 lbs (adjusted to 260 lbs due to steal toed boots)  States that this is increased from 224 lbs 3 weeks ago at this office which he things was an error.  He is currently trying to lose weight and has made many dietary changes avoiding most carbs   He wishes to be weighed on the Body Composition scale on next visit.    Patient lives with wife. 2 children in college. Travels between multiple homes.  Eats out frequently.    10/17/14: Patient is here alone.  States that he has tried to increase carbs slightly but thinks that this in contributing to fluid retention.  He is also concerned that increased Prolactin and increased Metformin is contributing to this as well.  He wants to resume a low carb, high protein diet.  Weight is unchanged overall although different scales.  Patient has not found the time to exercise,  And sleeps only 4-5 hours per night which he has done for years.  He states that the amount of urination has decreased as well.  Discussed concern of decreased caloric intake and decreased metabolism preventing weight loss.  "If I just have the self control and eat less, I will lose weight."  TANITA  BODY COMP RESULTS (?acuracy in this pt) 10/17/14 261   BMI (kg/m^2) 35.4   Fat Mass (lbs) 52.5   Fat Free Mass (lbs) 208.5   Total Body Water  (lbs) 152.5   06/22/15: Patient is here alone.  He has been exercising 3x/week- 45 minutes of vigorous walking with 10 lbs of weights on each side.  He plans to increase this to 5 times per week.  He continues to follow the [employer].  His weight has decreased to 255 lbs today in the past 6 months.  He has cut back on portions.  States that his goal is below 250 lbs by July.  His blood pressure has reduced and A1C decreased to 7.4% today from 10.6% 01/05/15.  He continues to work and travel and only gets about 4 hours sleep per night.  He complains of hunger at night.  Eats an Atkins bar but this makes him crave more.   10/19/15: Patient is here alone.  Today's weight is 250 lbs.  He was hoping to be under 250 lbs today.  He has lost 5 lbs in the past 4 months and states how difficult it is to lose weight.  He has been following a modified Adkin's diet which is very rigid in portion control and lacks inconsistency.  He also does intermittent fasting and only eats a small amount of almonds each Tuesday.  He is considering also doing this on Thursdays as well. Average protein intake is 80 grams daily which meets needs and is not excessive.  He  reports not drinking enough water and only sleeping about 4 hours each night.  10/16/15:  GFR 57.9, BUN 30, Creatinine 1.59, normal vitamin B12, A1C of 7.7%which has increased from 7.1% 06/22/15.  He would like to lose to 225 lbs.  Wt Readings from Last 3 Encounters:  06/22/15 255 lb 9.6 oz (115.939 kg)  06/22/15 255 lb (115.667 kg)  03/02/15 257 lb 3.2 oz (116.665 kg)    Preferred Learning Style:   No preference indicated   Learning Readiness:   Ready  Change in progress   MEDICATIONS: see list to include:  Invokana, glipizide, and metformin XR   DIETARY INTAKE: Recently patient changed to avoid bread and other higher carbs as well as eliminating regular soda and sweets.  Patient reports that he was  "in denial about the diabetes" prior to recent  hospitalization. On Tuesday only eats small amount of almonds.  He reports that this has helped decrease his appetite.  He states that he no longer craves like he used to and feels satisfied with the quantity of his current intake.  24-hr recall: 2016 B ( AM): 1 patty of sausage and 1 scrambled egg Snk ( AM): none  L ( PM): 5 ounces lean steak or half of a hamburger and 1/2 but or other lean meat plus greens Snk ( PM): none D ( PM): 3 ounces grilled chicken and salad Snk ( PM): none Beverages: water, occasional diet coke  Usual physical activity: walks 1 mile 3 times per week and cardio exercise inconsistently.  He has been having difficulty increasing his exercise further due to work schedule and time.  Estimated energy needs: (modified) 1600 calories 180 g carbohydrates 100 g protein 53 g fat  Progress Towards Goal(s):  In progress.   Nutritional Diagnosis:  NB-1.1 Food and nutrition-related knowledge deficit As related to balance of carbohydrate, protein, and fat.  As evidenced by diet hx..    Intervention:  Nutrition counseling and diabetes education continued.  Weight discussed with concern of low calorie intake decreasing metabolism.  Patient is concerned that increasing carb intake slightly has increased fluid retention and prevented weight loss.  We now have the body composition scale results and can use this as a comparison for other visits.  Patient prefers to follow a more strict low carb diet.  Patient is concerned about water retention.  Discussed sodium awareness of foods when eating out.   We discussed the implication of inadequate sleep, hormone balance, and increased inflammation, and inactivity on weight as well.  Nutrition to decrease inflammation discussed briefly.  Patient wants to try the high protein diet for another month.  10/19/15: Discussed his current diet and adjustments suggested.  Continue to discuss the need for consistent exercise, adequate sleep, and need  for adequate fluid intake.  Discussed benefit of more nutritionally varied diet, vitamin C's intake to increase iron absorption, and effects of over restricting.  Consider the following: Add more non starchy vegetables. Add more fish instead of red meat Be sure to drink enough water Consider walking most days of the week.  Teaching Method Utilized:  Visual Auditory Hands on  Handouts given during visit include:  Meal plan card  Barriers to learning/adherence to lifestyle change: increased traveling  Demonstrated degree of understanding via:  Teach Back   Monitoring/Evaluation:  Dietary intake, exercise, label reading, and body weight 1 month or prn per patient choice.

## 2015-10-19 NOTE — Patient Instructions (Addendum)
Consider the following: Add more non starchy vegetables. Add more fish instead of red meat Be sure to drink enough water Consider walking most days of the week.

## 2015-10-19 NOTE — Patient Instructions (Addendum)
Dostinex 1/2 pill 2x per week  Invokana 1/2 daily  Watch portions better  Check Coverage for Trulicity or Bydureon  Check blood sugars on waking up 2-3 times a week Also check blood sugars about 2 hours after a meal and do this after different meals by rotation  Recommended blood sugar levels on waking up is 90-130 and about 2 hours after meal is 130-160  Please bring your blood sugar monitor to each visit, thank you

## 2015-10-21 ENCOUNTER — Encounter: Payer: Self-pay | Admitting: Endocrinology

## 2015-10-23 ENCOUNTER — Other Ambulatory Visit: Payer: Self-pay | Admitting: Endocrinology

## 2015-11-23 ENCOUNTER — Other Ambulatory Visit: Payer: BLUE CROSS/BLUE SHIELD

## 2015-11-23 ENCOUNTER — Ambulatory Visit: Payer: BLUE CROSS/BLUE SHIELD | Admitting: Dietician

## 2015-12-21 ENCOUNTER — Other Ambulatory Visit: Payer: Self-pay | Admitting: Endocrinology

## 2015-12-22 ENCOUNTER — Other Ambulatory Visit: Payer: Self-pay | Admitting: Endocrinology

## 2015-12-28 ENCOUNTER — Encounter: Payer: BLUE CROSS/BLUE SHIELD | Attending: Endocrinology | Admitting: Dietician

## 2015-12-28 ENCOUNTER — Other Ambulatory Visit (INDEPENDENT_AMBULATORY_CARE_PROVIDER_SITE_OTHER): Payer: BLUE CROSS/BLUE SHIELD

## 2015-12-28 DIAGNOSIS — D352 Benign neoplasm of pituitary gland: Secondary | ICD-10-CM

## 2015-12-28 DIAGNOSIS — Z713 Dietary counseling and surveillance: Secondary | ICD-10-CM | POA: Diagnosis not present

## 2015-12-28 DIAGNOSIS — E1165 Type 2 diabetes mellitus with hyperglycemia: Secondary | ICD-10-CM

## 2015-12-28 DIAGNOSIS — D6489 Other specified anemias: Secondary | ICD-10-CM

## 2015-12-28 DIAGNOSIS — E1101 Type 2 diabetes mellitus with hyperosmolarity with coma: Secondary | ICD-10-CM

## 2015-12-28 LAB — BASIC METABOLIC PANEL
BUN: 39 mg/dL — ABNORMAL HIGH (ref 6–23)
CALCIUM: 8.8 mg/dL (ref 8.4–10.5)
CO2: 27 mEq/L (ref 19–32)
Chloride: 106 mEq/L (ref 96–112)
Creatinine, Ser: 1.57 mg/dL — ABNORMAL HIGH (ref 0.40–1.50)
GFR: 58.75 mL/min — AB (ref >60.00)
GLUCOSE: 155 mg/dL — AB (ref 70–99)
Potassium: 4.5 mEq/L (ref 3.5–5.1)
SODIUM: 137 meq/L (ref 135–145)

## 2015-12-28 LAB — HEMOGLOBIN A1C: Hgb A1c MFr Bld: 7.1 % — ABNORMAL HIGH (ref 4.6–6.5)

## 2015-12-28 LAB — CBC
HCT: 32.5 % — ABNORMAL LOW (ref 39.0–52.0)
HEMOGLOBIN: 10.9 g/dL — AB (ref 13.0–17.0)
MCHC: 33.6 g/dL (ref 30.0–36.0)
MCV: 84.2 fl (ref 78.0–100.0)
PLATELETS: 197 10*3/uL (ref 150.0–400.0)
RBC: 3.86 Mil/uL — ABNORMAL LOW (ref 4.22–5.81)
RDW: 13.9 % (ref 11.5–15.5)
WBC: 5.1 10*3/uL (ref 4.0–10.5)

## 2015-12-28 LAB — MAGNESIUM: MAGNESIUM: 2 mg/dL (ref 1.5–2.5)

## 2015-12-28 LAB — TESTOSTERONE: Testosterone: 248.28 ng/dL — ABNORMAL LOW (ref 300.00–890.00)

## 2015-12-28 LAB — VITAMIN D 25 HYDROXY (VIT D DEFICIENCY, FRACTURES): VITD: 5.89 ng/mL — AB (ref 30.00–100.00)

## 2015-12-28 MED ORDER — ESOMEPRAZOLE MAGNESIUM 40 MG PO CPDR: 40.0000 mg | DELAYED_RELEASE_CAPSULE | Freq: Every day | ORAL | 3 refills | Status: DC

## 2015-12-28 NOTE — Progress Notes (Signed)
Medical Nutrition Therapy:  Appt start time: 8315 end time:  1730.   Assessment:  09/22/14 Primary concerns today: Patient is here alone.  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  He was diagnosed with diabetes about 2 years ago but was in denial at that time.  States that he continued to work long hours, eat out a lot, drink regular soda, and did not have time for exercise.  HgBA1C 12.1 (07/18/14).  Hx also includes a prolactin secreting pituitary adenoma, benign tumor of pituitary gland with compression on the optic nerve and HTN.  He reports taking his medication.  Weight today 266 lbs (adjusted to 260 lbs due to steal toed boots)  States that this is increased from 224 lbs 3 weeks ago at this office which he things was an error.  He is currently trying to lose weight and has made many dietary changes avoiding most carbs   He wishes to be weighed on the Body Composition scale on next visit.    Patient lives with wife. 2 children in college. Travels between multiple homes.  Eats out frequently.    10/17/14: Patient is here alone.  States that he has tried to increase carbs slightly but thinks that this in contributing to fluid retention.  He is also concerned that increased Prolactin and increased Metformin is contributing to this as well.  He wants to resume a low carb, high protein diet.  Weight is unchanged overall although different scales.  Patient has not found the time to exercise,  And sleeps only 4-5 hours per night which he has done for years.  He states that the amount of urination has decreased as well.  Discussed concern of decreased caloric intake and decreased metabolism preventing weight loss.  "If I just have the self control and eat less, I will lose weight."  TANITA  BODY COMP RESULTS (?acuracy in this pt) 10/17/14 261   BMI (kg/m^2) 35.4   Fat Mass (lbs) 52.5   Fat Free Mass (lbs) 208.5   Total Body Water  (lbs) 152.5   06/22/15: Patient is here alone.  He has been exercising 3x/week- 45 minutes of vigorous walking with 10 lbs of weights on each side.  He plans to increase this to 5 times per week.  He continues to follow the [employer].  His weight has decreased to 255 lbs today in the past 6 months.  He has cut back on portions.  States that his goal is below 250 lbs by July.  His blood pressure has reduced and A1C decreased to 7.4% today from 10.6% 01/05/15.  He continues to work and travel and only gets about 4 hours sleep per night.  He complains of hunger at night.  Eats an Atkins bar but this makes him crave more.   10/19/15: Patient is here alone.  Today's weight is 250 lbs.  He was hoping to be under 250 lbs today.  He has lost 5 lbs in the past 4 months and states how difficult it is to lose weight.  He has been following a modified Adkin's diet which is very rigid in portion control and lacks inconsistency.  He also does intermittent fasting and only eats a small amount of almonds each Tuesday.  He is considering also doing this on Thursdays as well. Average protein intake is 80 grams daily which meets needs and is not excessive.  He  reports not drinking enough water and only sleeping about 4 hours each night.  10/16/15:  GFR 57.9, BUN 30, Creatinine 1.59, normal vitamin B12, A1C of 7.7%which has increased from 7.1% 06/22/15.  He would like to lose to 225 lbs.  Wt Readings from Last 3 Encounters:  10/19/15 266 lb (120.7 kg)  10/19/15 250 lb (113.4 kg)  06/22/15 255 lb 9.6 oz (115.9 kg)   12/28/15: Weight is 254 lbs today.  He is frustrated about this.  States that he has been consistent with his exercise (1 mile per day 6 days per week plus weights 30 minutes 3 days per week).  He feels that he has gained muscle mass and notes a difference in his appearance.  He continues to follow a modified Adkins/intermittent fasting diet.  He sleeps only 3 hours per night due to working.  States that his  blood sugars have been 120-160 each morning and a high once of 210 after meal (30-1 hour after meal).  His diet averages about 1300-1400 calories daily.    Preferred Learning Style:   No preference indicated   Learning Readiness:   Ready  Change in progress   MEDICATIONS: see list to include:  Invokana, glipizide, and metformin XR   DIETARY INTAKE: Recently patient changed to avoid bread and other higher carbs as well as eliminating regular soda and sweets.  Patient reports that he was  "in denial about the diabetes" prior to recent hospitalization. On Tuesday only eats small amount of almonds.  He reports that this has helped decrease his appetite.  He states that he no longer craves like he used to and feels satisfied with the quantity of his current intake.  24-hr recall: 2016 B ( AM): 1 patty of sausage and 1 scrambled egg Snk ( AM): none  L ( PM):  3-4 ounces lean steak or half of a hamburger and 1/2 but or other lean meat plus greens or other non starchy vegetable Snk ( PM): none D ( PM): 3 ounces chicken leg or fish and salad or steamed vege Snk ( PM): none Beverages: water, occasional diet coke  Usual physical activity: walks 1 mile 3 times per week and cardio exercise inconsistently.  He has been having difficulty increasing his exercise further due to work schedule and time.  Estimated energy needs: (modified) 1600 calories 180 g carbohydrates 100 g protein 53 g fat  Progress Towards Goal(s):  In progress.   Nutritional Diagnosis:  NB-1.1 Food and nutrition-related knowledge deficit As related to balance of carbohydrate, protein, and fat.  As evidenced by diet hx..    Intervention:  Nutrition counseling and diabetes education continued.  Weight discussed with concern of low calorie intake decreasing metabolism.  Patient is concerned that increasing carb intake slightly has increased fluid retention and prevented weight loss.  We now have the body composition scale  results and can use this as a comparison for other visits.  Patient prefers to follow a more strict low carb diet.  Patient is concerned about water retention.  Discussed sodium awareness of foods when eating out.   We discussed the implication of inadequate sleep, hormone balance, and increased inflammation, and inactivity on weight as well.  Nutrition to decrease inflammation discussed briefly.  Patient wants to try the high protein diet for another month.  12/28/15:   Continued weight loss discussion.  Patient reports being 230 lbs 25 years ago then increased to 300 lbs and has since lost.  He verbalizes how  difficult weight loss is.  He would like to get below 250 lbs.  Based on imbalance of nutritional intake, patient requested magnesium and vitamin D to be added to his labs.    Goals per Dr. Mayer Masker: Consider getting a trainer Sleep 6 hours per night  Teaching Method Utilized:  Visual Auditory Hands on  Handouts given during visit include:  Meal plan card  Barriers to learning/adherence to lifestyle change: increased traveling  Demonstrated degree of understanding via:  Teach Back   Monitoring/Evaluation:  Dietary intake, exercise, label reading, and body weight 1 month or prn per patient choice.

## 2015-12-28 NOTE — Patient Instructions (Signed)
Consider a Physiological scientist. Increase sleep to 6 hours per night.

## 2015-12-29 LAB — PROLACTIN: Prolactin: 22.6 ng/mL — ABNORMAL HIGH (ref 4.0–15.2)

## 2016-01-21 ENCOUNTER — Encounter: Payer: Self-pay | Admitting: Endocrinology

## 2016-01-21 ENCOUNTER — Ambulatory Visit: Payer: BLUE CROSS/BLUE SHIELD | Admitting: Dietician

## 2016-01-21 ENCOUNTER — Ambulatory Visit (INDEPENDENT_AMBULATORY_CARE_PROVIDER_SITE_OTHER): Payer: BLUE CROSS/BLUE SHIELD | Admitting: Endocrinology

## 2016-01-21 ENCOUNTER — Other Ambulatory Visit: Payer: Self-pay | Admitting: *Deleted

## 2016-01-21 ENCOUNTER — Ambulatory Visit: Payer: BLUE CROSS/BLUE SHIELD | Admitting: Endocrinology

## 2016-01-21 VITALS — BP 122/76 | HR 70 | Temp 98.0°F | Resp 16 | Ht 74.0 in | Wt 253.4 lb

## 2016-01-21 DIAGNOSIS — D631 Anemia in chronic kidney disease: Secondary | ICD-10-CM

## 2016-01-21 DIAGNOSIS — D352 Benign neoplasm of pituitary gland: Secondary | ICD-10-CM

## 2016-01-21 DIAGNOSIS — E1165 Type 2 diabetes mellitus with hyperglycemia: Secondary | ICD-10-CM

## 2016-01-21 DIAGNOSIS — N182 Chronic kidney disease, stage 2 (mild): Secondary | ICD-10-CM | POA: Diagnosis not present

## 2016-01-21 LAB — BASIC METABOLIC PANEL
BUN: 34 mg/dL — AB (ref 6–23)
CO2: 28 meq/L (ref 19–32)
Calcium: 9.6 mg/dL (ref 8.4–10.5)
Chloride: 105 mEq/L (ref 96–112)
Creatinine, Ser: 1.58 mg/dL — ABNORMAL HIGH (ref 0.40–1.50)
GFR: 58.3 mL/min — AB (ref >60.00)
Glucose, Bld: 180 mg/dL — ABNORMAL HIGH (ref 70–99)
POTASSIUM: 4.3 meq/L (ref 3.5–5.1)
SODIUM: 139 meq/L (ref 135–145)

## 2016-01-21 MED ORDER — VALSARTAN 80 MG PO TABS: 80.0000 mg | ORAL_TABLET | Freq: Every day | ORAL | 1 refills | Status: DC

## 2016-01-21 MED ORDER — CABERGOLINE 0.5 MG PO TABS: ORAL_TABLET | ORAL | 3 refills | Status: DC

## 2016-01-21 NOTE — Patient Instructions (Addendum)
Reduce Amlodipine to 1/2  Change Lisinopril to Diovan 80mg   Check blood sugars on waking up  3x weekly  Also check blood sugars about 2 hours after a meal and do this after different meals by rotation  Recommended blood sugar levels on waking up is 90-130 and about 2 hours after meal is 130-160  Please bring your blood sugar monitor to each visit, thank you  VITAMIN D3, 5000 UNITS with a meal

## 2016-01-21 NOTE — Progress Notes (Signed)
Patient ID: Roberto [name], male   DOB: 06-07-1960, 55 y.o.   MRN: [id number]   Chief complaint: Follow-up of multiple endocrine problems  History of Present Illness:  PROBLEM 1: Pituitary tumor  His MRI on 07/18/14 showed a pituitary tumor with the following description: 2.3 x 2 x 1.8 cm mass centered in the sella with suprasellar extension and cavernous sinus extension greater on the left with an appearance most suggestive of pituitary macroadenoma. This impresses upon the optic chiasm  Had difficulty with peripheral vision on the left side but this has resolved He being followed by ophthalmologist periodically for this and macular edema  Has has had a follow-up MRI in 2/17 showing focal area of delayed enhancement this represents a pituitary adenoma on the left. This lesion measures 15 x 12 x 11 mm. There is deviation of the pituitary stalk to the right. There is no definite cavernous sinus invasion. The lesion does not contact the optic Chiasm   PROBLEM 2:  Hyperprolactinemia:  On evaluation for his pituitary tumor in the hospital he had a prolactin level checked and this was markedly increased at 1312 He had symptoms of decreased libido and gynecomastia Testosterone level was low at 70  He  was started on Dostinex 0.5 mg initially half tablet twice a week and then 1 tablet twice a week on his initial consultation on 08/04/14 He is tolerating the Dostinex well. Prolactin has been consistently normal since 7/16 Follow-up level was low normal and his dose was reduced back to half tablet twice a week in July However his prolactin is relatively high again   Lab Results  Component Value Date   PROLACTIN 22.6 (H) 12/28/2015   PROLACTIN 5.9 10/16/2015   PROLACTIN 7.2 07/02/2015   PROLACTIN 12.7 01/05/2015   PROLACTIN 11.9 10/18/2014   . HYPOGONADISM: Despite improvement in prolactin his Testosterone levels tend to be still  low He has had improvement in his libido and  does not complain of fatigue   He is still keen on starting any testosterone supplements   Lab Results  Component Value Date   TESTOSTERONE 248.28 (L) 12/28/2015    PROBLEM 3:  DIABETES type II with obesity    He was diagnosed  in 2014  and has been Previously managed by a primary care physician who he works with.  His A1c has been checked periodically and has been usually in the 8-12% range previously On admission to the hospital his glucose was 380 without any evidence of ketosis  Current management, blood sugar patterns and problems identified:  He thinks his blood sugars are higher in the morning if he is not able to sleep at night, sometimes sleeping only 3 hours  Although his A1c was 7.7 previously it is now 7.1   Invokana was decreased to a half of the 300 mg on his last visit because of increase in creatinine  He has seen the dietitian again and is trying to improve his diet  He is trying to do some exercise with walking or treadmill and is going to start with a personal trainer now  Again he did not bring his monitor for download as recommended again on his last visit  No hypoglycemia with glipizide  5 mg ER  By recall his blood sugars are as follows: 130-150 range fasting and 150-185 after meals  Weight history:  Wt Readings from Last 3 Encounters:  01/21/16 253 lb 6.4 oz (114.9 kg)  10/19/15 266 lb (120.7 kg)  10/19/15 250 lb (113.4 kg)    Lab Results  Component Value Date   HGBA1C 7.1 (H) 12/28/2015   HGBA1C 7.7 (H) 10/16/2015   HGBA1C 7.1 06/22/2015   Lab Results  Component Value Date   MICROALBUR 77.0 (H) 01/05/2015   LDLCALC 49 07/02/2015   CREATININE 1.57 (H) 12/28/2015    OTHER active problems and review of systems    Past Medical History:  Diagnosis Date  . Diabetes mellitus without complication ([place])   . Hypertension     No past surgical history on file.  Family History  Problem Relation Age of Onset  . Cancer Other   .  Hypertension Other   . Diabetes Mother   . Diabetes Father   . Heart disease Neg Hx     Social History:  reports that he has never smoked. He has never used smokeless tobacco. He reports that he does not drink alcohol or use drugs.  Allergies: No Known Allergies    Medication List       Accurate as of 01/21/16 12:13 PM. Always use your most recent med list.          amLODipine 10 MG tablet Commonly known as:  NORVASC Take 10 mg by mouth daily.   aspirin EC 81 MG tablet Take 81 mg by mouth daily.   cabergoline 0.5 MG tablet Commonly known as:  DOSTINEX Take 1 tablet twice a week   esomeprazole 40 MG capsule Commonly known as:  NEXIUM Take 1 capsule (40 mg total) by mouth daily.   glipiZIDE 5 MG 24 hr tablet Commonly known as:  GLUCOTROL XL TAKE 2 TABLETS EVERY DAY WITH BREAKFAST   INVOKANA 300 MG Tabs tablet Generic drug:  canagliflozin TAKE 1 TABLET (300 MG TOTAL) BY MOUTH DAILY BEFORE BREAKFAST.   isosorbide mononitrate 60 MG 24 hr tablet Commonly known as:  IMDUR Take 60 mg by mouth daily.   metFORMIN 500 MG 24 hr tablet Commonly known as:  GLUCOPHAGE-XR TAKE 4 TABLETS EVERY DAY   metoprolol succinate 50 MG 24 hr tablet Commonly known as:  TOPROL-XL Take 50 mg by mouth daily. Take with or immediately following a meal.   valsartan 80 MG tablet Commonly known as:  DIOVAN Take 1 tablet (80 mg total) by mouth daily.       LABS:  No visits with results within 1 Week(s) from this visit.  Latest known visit with results is:  Lab on 12/28/2015  Component Date Value Ref Range Status  . Sodium 12/28/2015 137  135 - 145 mEq/L Final  . Potassium 12/28/2015 4.5  3.5 - 5.1 mEq/L Final  . Chloride 12/28/2015 106  96 - 112 mEq/L Final  . CO2 12/28/2015 27  19 - 32 mEq/L Final  . Glucose, Bld 12/28/2015 155* 70 - 99 mg/dL Final  . BUN 12/28/2015 39* 6 - 23 mg/dL Final  . Creatinine, Ser 12/28/2015 1.57* 0.40 - 1.50 mg/dL Final  . Calcium 12/28/2015 8.8  8.4  - 10.5 mg/dL Final  . GFR 12/28/2015 58.75* >60.00 mL/min Final  . Hgb A1c MFr Bld 12/28/2015 7.1* 4.6 - 6.5 % Final  . Testosterone 12/28/2015 248.28* 300.00 - 890.00 ng/dL Final  . WBC 12/28/2015 5.1  4.0 - 10.5 K/uL Final  . RBC 12/28/2015 3.86* 4.22 - 5.81 Mil/uL Final  . Platelets 12/28/2015 197.0  150.0 - 400.0 K/uL Final  . Hemoglobin 12/28/2015 10.9* 13.0 - 17.0 g/dL Final  . HCT 12/28/2015 32.5* 39.0 - 52.0 % Final  .  MCV 12/28/2015 84.2  78.0 - 100.0 fl Final  . MCHC 12/28/2015 33.6  30.0 - 36.0 g/dL Final  . RDW 12/28/2015 13.9  11.5 - 15.5 % Final  . Prolactin 12/29/2015 22.6* 4.0 - 15.2 ng/mL Final  . Magnesium 12/28/2015 2.0  1.5 - 2.5 mg/dL Final  . VITD 12/28/2015 5.89* 30.00 - 100.00 ng/mL Final     REVIEW OF SYSTEMS:        Eyes: No known diabetic retinopathy, has had macular edema, followed regularly by ophthalmologist   Hypertension: has had a long history of high blood pressure  Currently on regimen of 10 mg lisinopril, amlodipine 10 mg and low dose metoprolol  He thinks diastolic readings are about 130s/80-90 when he checks at home  NEPHROPATHY: He has mild increase in microalbumin  Creatinine had increased slightly when Invokana was increased up to 300 mg but it is still the same with reducing this to half tablet  He also does not think he is drinking enough fluids   Lab Results  Component Value Date   CREATININE 1.57 (H) 12/28/2015   CREATININE 1.59 (H) 10/16/2015   CREATININE 1.37 07/02/2015   CREATININE 1.13 10/18/2014    Neuropathy: He has only occasional burning sensations, no significant sharp pains    Diabetic foot exam in 3/17  showed no abnormality    VITAMIN D deficiency: This was done as a screening and it is extremely low at 5.8.  Has not been on any supplements  Lab Results  Component Value Date   VD25OH 5.89 (L) 12/28/2015      PHYSICAL EXAM:  BP 136/84   Pulse 70   Temp 98 F (36.7 C)   Resp 16   Ht 6\' 2"  (1.88 m)    Wt 253 lb 6.4 oz (114.9 kg)   SpO2 95%   BMI 32.53 kg/m   2+ ankle edema present, more on the right  ASSESSMENT/PLAN:   Macroprolactinoma of the pituitary gland with very high baseline prolactin level of 1315 He has done very well with starting Dostinex With improvement of his prolactin level and reduction in size of the tumor  Continuing half tablet of the 0.5 mg twice a week, prolactin has gone up above normal slightly Will need to do another follow-up MRI of his pituitary gland  HYPOGONADISM: This may be related to metabolic syndrome as testosterone level has continued to stay low Currently is not symptomatic with no fatigue or change in libido, does not desire treatment  The other pituitary gland functions have been normal so far and free T4 also normal  DIABETES with mild obesity and previously persistently poor control.  Home blood sugars are mildly increased but he has not brought his monitor for download and not clear how when he is checking readings With his trying to do better on his diet and  exercise he has lost weight and his A1c is back to 7.1  Currently on maximum dose metformin ER, Invokana 150 mg and glipizide ER 5 mg He is doing fairly well his diet with talking to the dietitian and is going to increase his exercise regimen with the help of a personal trainer No change in treatment regimen today  HYPERTENSION: This is  adequately controlled.  Also has a history of increasing microalbumin which needs follow-up  Renal insufficiency: This may be partly related to  Invokana and combining with lisinopril. Will switch his lisinopril to Diovan 80 mg He will also reduce his Norvasc to prevent reduce  renal blood flow as blood pressure may be low normal at times with his weight coming down  EDEMA: Still tends to have some dependent edema presumably from Norvasc and will reduce this to half tablet  VITAMIN D deficiency: He will start a supplement with 5000 units vitamin D 3  OTC, discussed that his level is extremely low  ANEMIA: Strongly recommended him to establish a PCP for further evaluation, he thinks he can call and schedule his colonoscopy now  Flu vaccine: He will get this at his office  Follow-up for office visit in  3 months   Patient Instructions  Reduce Amlodipine to 1/2  Change Lisinopril to Diovan 80mg   Check blood sugars on waking up  3x weekly  Also check blood sugars about 2 hours after a meal and do this after different meals by rotation  Recommended blood sugar levels on waking up is 90-130 and about 2 hours after meal is 130-160  Please bring your blood sugar monitor to each visit, thank you  VITAMIN D3, 5000 UNITS with a meal       Counseling time on subjects discussed above is over 50% of today's 25 minute visit  Pacen Watford 01/21/2016, 12:13 PM   Note: This office note was prepared with Estate agent. Any transcriptional errors that result from this process are unintentional.

## 2016-01-28 ENCOUNTER — Telehealth: Payer: Self-pay | Admitting: Endocrinology

## 2016-01-28 NOTE — Telephone Encounter (Signed)
[facility] called medication was sent to the pharmacy for  valsartan (DIOVAN) 80 MG tablet, and patient stated that he di not take this medication please advise.  [phone]

## 2016-01-29 NOTE — Telephone Encounter (Signed)
I see a prescription for valsartan 80 mg.  These find out what the problem is

## 2016-01-29 NOTE — Telephone Encounter (Signed)
Left a voice mail for the patient to call me back- gave message from Dr. Dwyane Dee

## 2016-02-01 ENCOUNTER — Telehealth: Payer: Self-pay

## 2016-02-01 NOTE — Telephone Encounter (Signed)
Left a voicemail for him.  He was supposed to switch lisinopril to Diovan on the last visit when it was prescribed and reminded him about this.  He will call if he has not received the prescription or needs it at a different pharmacy than the local [facility]

## 2016-02-01 NOTE — Telephone Encounter (Signed)
Patient called and stated that he was upset because we have Diovan in his chart as a medication that he is taking and he says he is not. I went back to the last office note on 01/21/2016 and read it. In the note it states patient is to come off of Lisinopril and switch to Diovan 80mg . Patient states that this is an error and will not be taking the Diovan and wants it removed from his medication list. Is it ok for me to take Diovan out of his chart and list the Lisinopril instead. Please advise

## 2016-02-21 ENCOUNTER — Other Ambulatory Visit: Payer: Self-pay | Admitting: Endocrinology

## 2016-03-06 DIAGNOSIS — E113311 Type 2 diabetes mellitus with moderate nonproliferative diabetic retinopathy with macular edema, right eye: Secondary | ICD-10-CM | POA: Diagnosis not present

## 2016-04-11 ENCOUNTER — Other Ambulatory Visit: Payer: BLUE CROSS/BLUE SHIELD

## 2016-04-18 ENCOUNTER — Ambulatory Visit: Payer: BLUE CROSS/BLUE SHIELD | Admitting: Endocrinology

## 2016-04-21 ENCOUNTER — Other Ambulatory Visit: Payer: Self-pay | Admitting: Endocrinology

## 2016-05-08 DIAGNOSIS — E113313 Type 2 diabetes mellitus with moderate nonproliferative diabetic retinopathy with macular edema, bilateral: Secondary | ICD-10-CM | POA: Diagnosis not present

## 2016-05-16 ENCOUNTER — Other Ambulatory Visit: Payer: BLUE CROSS/BLUE SHIELD

## 2016-05-23 ENCOUNTER — Ambulatory Visit: Payer: BLUE CROSS/BLUE SHIELD | Admitting: Endocrinology

## 2016-06-06 ENCOUNTER — Other Ambulatory Visit (INDEPENDENT_AMBULATORY_CARE_PROVIDER_SITE_OTHER): Payer: BLUE CROSS/BLUE SHIELD

## 2016-06-06 DIAGNOSIS — E1165 Type 2 diabetes mellitus with hyperglycemia: Secondary | ICD-10-CM | POA: Diagnosis not present

## 2016-06-06 DIAGNOSIS — D631 Anemia in chronic kidney disease: Secondary | ICD-10-CM | POA: Diagnosis not present

## 2016-06-06 DIAGNOSIS — D352 Benign neoplasm of pituitary gland: Secondary | ICD-10-CM

## 2016-06-06 DIAGNOSIS — N182 Chronic kidney disease, stage 2 (mild): Secondary | ICD-10-CM

## 2016-06-06 LAB — COMPREHENSIVE METABOLIC PANEL
ALT: 13 U/L (ref 0–53)
AST: 16 U/L (ref 0–37)
Albumin: 3.2 g/dL — ABNORMAL LOW (ref 3.5–5.2)
Alkaline Phosphatase: 77 U/L (ref 39–117)
BUN: 23 mg/dL (ref 6–23)
CHLORIDE: 104 meq/L (ref 96–112)
CO2: 30 mEq/L (ref 19–32)
CREATININE: 1.34 mg/dL (ref 0.40–1.50)
Calcium: 9.1 mg/dL (ref 8.4–10.5)
GFR: 70.42 mL/min (ref >60.00)
GLUCOSE: 166 mg/dL — AB (ref 70–99)
POTASSIUM: 3.7 meq/L (ref 3.5–5.1)
SODIUM: 138 meq/L (ref 135–145)
Total Bilirubin: 0.3 mg/dL (ref 0.2–1.2)
Total Protein: 7.3 g/dL (ref 6.0–8.3)

## 2016-06-06 LAB — LIPID PANEL
CHOLESTEROL: 112 mg/dL (ref 0–200)
HDL: 30.1 mg/dL — ABNORMAL LOW (ref >39.00)
LDL Cholesterol: 55 mg/dL (ref 0–99)
NONHDL: 82.02
Total CHOL/HDL Ratio: 4
Triglycerides: 137 mg/dL (ref 0.0–149.0)
VLDL: 27.4 mg/dL (ref 0.0–40.0)

## 2016-06-06 LAB — CBC
HCT: 29.6 % — ABNORMAL LOW (ref 39.0–52.0)
Hemoglobin: 9.7 g/dL — ABNORMAL LOW (ref 13.0–17.0)
MCHC: 32.8 g/dL (ref 30.0–36.0)
MCV: 86.8 fl (ref 78.0–100.0)
PLATELETS: 221 10*3/uL (ref 150.0–400.0)
RBC: 3.41 Mil/uL — AB (ref 4.22–5.81)
RDW: 14.4 % (ref 11.5–15.5)
WBC: 4.7 10*3/uL (ref 4.0–10.5)

## 2016-06-06 LAB — T4, FREE: FREE T4: 0.84 ng/dL (ref 0.60–1.60)

## 2016-06-06 LAB — HEMOGLOBIN A1C: HEMOGLOBIN A1C: 7.1 % — AB (ref 4.6–6.5)

## 2016-06-06 LAB — TESTOSTERONE: Testosterone: 278.39 ng/dL — ABNORMAL LOW (ref 300.00–890.00)

## 2016-06-07 LAB — PROLACTIN: Prolactin: 5 ng/mL (ref 4.0–15.2)

## 2016-06-13 ENCOUNTER — Encounter: Payer: Self-pay | Admitting: Endocrinology

## 2016-06-13 ENCOUNTER — Ambulatory Visit (INDEPENDENT_AMBULATORY_CARE_PROVIDER_SITE_OTHER): Payer: BLUE CROSS/BLUE SHIELD | Admitting: Endocrinology

## 2016-06-13 VITALS — BP 124/78 | HR 79 | Ht 74.0 in | Wt 259.0 lb

## 2016-06-13 DIAGNOSIS — E230 Hypopituitarism: Secondary | ICD-10-CM | POA: Diagnosis not present

## 2016-06-13 DIAGNOSIS — D352 Benign neoplasm of pituitary gland: Secondary | ICD-10-CM | POA: Diagnosis not present

## 2016-06-13 DIAGNOSIS — E1165 Type 2 diabetes mellitus with hyperglycemia: Secondary | ICD-10-CM

## 2016-06-13 DIAGNOSIS — D6489 Other specified anemias: Secondary | ICD-10-CM

## 2016-06-13 DIAGNOSIS — I10 Essential (primary) hypertension: Secondary | ICD-10-CM | POA: Diagnosis not present

## 2016-06-13 DIAGNOSIS — E559 Vitamin D deficiency, unspecified: Secondary | ICD-10-CM | POA: Diagnosis not present

## 2016-06-13 NOTE — Patient Instructions (Addendum)
Take Dostinex 1 tab alt with 1/2  Take Invokana daily before Bfst

## 2016-06-13 NOTE — Progress Notes (Signed)
Patient ID: Roberto [name], male   DOB: [birth year], 58 y.o.   MRN: [id number]   Chief complaint: Follow-up of multiple endocrine problems  History of Present Illness:  PROBLEM 1: Pituitary tumor  His MRI on 07/18/14 showed a pituitary tumor with the following description: 2.3 x 2 x 1.8 cm mass centered in the sella with suprasellar extension and cavernous sinus extension greater on the left with an appearance most suggestive of pituitary macroadenoma. This impresses upon the optic chiasm  Had difficulty with peripheral vision on the left side but this has resolved He being followed by ophthalmologist periodically for this and macular edema  Has has had a follow-up MRI in 2/17 showing focal area of delayed enhancement this represents a pituitary adenoma on the left.  This lesion measures 15 x 12 x 11 mm.  There is deviation of the pituitary stalk to the right. There is no definite cavernous sinus invasion. The lesion does not contact the optic Chiasm  No recent headaches  PROBLEM 2:  Hyperprolactinemia:  On evaluation for his pituitary tumor in the hospital he had a prolactin level checked and this was markedly increased at 1312 He had symptoms of decreased libido and gynecomastia Testosterone level was low at 70  He  was started on Dostinex 0.5 mg initially half tablet twice a week and then 1 tablet twice a week on his initial consultation on 08/04/14 He is tolerating the Dostinex well. Prolactin has been consistently normal since 7/16  Follow-up level was low normal and his dose was reduced back to half tablet twice a week in July 2017 but subsequently he is back on one tablet twice a week when prolactin went up to 22.6  Prolactin is again low normal   Lab Results  Component Value Date   PROLACTIN 5.0 06/06/2016   PROLACTIN 22.6 (H) 12/28/2015   PROLACTIN 5.9 10/16/2015   PROLACTIN 7.2 07/02/2015   PROLACTIN 12.7 01/05/2015   . HYPOGONADISM:  He has had  improvement in his libido and does not complain of fatigue  His testosterone level is gradually improving He is not keen on starting any testosterone supplements   Lab Results  Component Value Date   TESTOSTERONE 278.39 (L) 06/06/2016    PROBLEM 3:  DIABETES type II with obesity    Gym 3/7  He was diagnosed  in 2014  and has been Previously managed by a primary care physician who he works with. On admission to the hospital in 2016 his glucose was 380 without any evidence of ketosis  More recently his A1c appears to be fairly stable at 7.1  Current management, blood sugar patterns and problems identified:  He again did not bring his monitor for download  He says that he had severe diarrhea for 3 weeks earlier this year and even though he had been on metformin and Invokana long-term he believes it was from the combination of the 2 and he stopped taking Invokana for some time  He is now taking Invokana only every other day but his blood sugars have started going up  He has also gone back to taking 2000 mg of metformin ER without side effects  Not clear what his blood sugars were prior to his episodes of diarrhea but he thinks they were reasonably good  He is now taking a 100 mg tablet of Invokana every other day  He has seen the dietitian again and is trying to be fairly consistent with his diet  He is  trying to do some exercise with walking or treadmill about 3 times per week at the gym  No hypoglycemia with glipizide  5 mg ER  By recall his blood sugars are as follows:  150 range fasting and 190 after meals  Weight history:  Wt Readings from Last 3 Encounters:  06/13/16 259 lb (117.5 kg)  01/21/16 253 lb 6.4 oz (114.9 kg)  10/19/15 266 lb (120.7 kg)    Lab Results  Component Value Date   HGBA1C 7.1 (H) 06/06/2016   HGBA1C 7.1 (H) 12/28/2015   HGBA1C 7.7 (H) 10/16/2015   Lab Results  Component Value Date   MICROALBUR 77.0 (H) 01/05/2015   LDLCALC 55  06/06/2016   CREATININE 1.34 06/06/2016    OTHER active problems and review of systems    Past Medical History:  Diagnosis Date  . Diabetes mellitus without complication ([place])   . Hypertension     No past surgical history on file.  Family History  Problem Relation Age of Onset  . Cancer Other   . Hypertension Other   . Diabetes Mother   . Diabetes Father   . Heart disease Neg Hx     Social History:  reports that he has never smoked. He has never used smokeless tobacco. He reports that he does not drink alcohol or use drugs.  Allergies: No Known Allergies  Allergies as of 06/13/2016   No Known Allergies     Medication List       Accurate as of 06/13/16 12:23 PM. Always use your most recent med list.          amLODipine 10 MG tablet Commonly known as:  NORVASC Take 10 mg by mouth daily.   aspirin EC 81 MG tablet Take 81 mg by mouth daily.   cabergoline 0.5 MG tablet Commonly known as:  DOSTINEX TAKE 0.5 TABLETS (0.25 MG TOTAL) BY MOUTH 2 (TWO) TIMES A WEEK.   glipiZIDE 5 MG 24 hr tablet Commonly known as:  GLUCOTROL XL TAKE 2 TABLETS EVERY DAY WITH BREAKFAST   INVOKANA 300 MG Tabs tablet Generic drug:  canagliflozin TAKE 1 TABLET (300 MG TOTAL) BY MOUTH DAILY BEFORE BREAKFAST.   isosorbide mononitrate 60 MG 24 hr tablet Commonly known as:  IMDUR Take 60 mg by mouth daily.   lisinopril 5 MG tablet Commonly known as:  PRINIVIL,ZESTRIL Take 5 mg by mouth daily.   metFORMIN 500 MG 24 hr tablet Commonly known as:  GLUCOPHAGE-XR TAKE 4 TABLETS EVERY DAY   metoprolol succinate 50 MG 24 hr tablet Commonly known as:  TOPROL-XL Take 50 mg by mouth daily. Take with or immediately following a meal.   valsartan 80 MG tablet Commonly known as:  DIOVAN Take 1 tablet (80 mg total) by mouth daily.       LABS:  No visits with results within 1 Week(s) from this visit.  Latest known visit with results is:  Lab on 06/06/2016  Component Date Value Ref  Range Status  . Hgb A1c MFr Bld 06/06/2016 7.1* 4.6 - 6.5 % Final  . Sodium 06/06/2016 138  135 - 145 mEq/L Final  . Potassium 06/06/2016 3.7  3.5 - 5.1 mEq/L Final  . Chloride 06/06/2016 104  96 - 112 mEq/L Final  . CO2 06/06/2016 30  19 - 32 mEq/L Final  . Glucose, Bld 06/06/2016 166* 70 - 99 mg/dL Final  . BUN 06/06/2016 23  6 - 23 mg/dL Final  . Creatinine, Ser 06/06/2016 1.34  0.40 -  1.50 mg/dL Final  . Total Bilirubin 06/06/2016 0.3  0.2 - 1.2 mg/dL Final  . Alkaline Phosphatase 06/06/2016 77  39 - 117 U/L Final  . AST 06/06/2016 16  0 - 37 U/L Final  . ALT 06/06/2016 13  0 - 53 U/L Final  . Total Protein 06/06/2016 7.3  6.0 - 8.3 g/dL Final  . Albumin 06/06/2016 3.2* 3.5 - 5.2 g/dL Final  . Calcium 06/06/2016 9.1  8.4 - 10.5 mg/dL Final  . GFR 06/06/2016 70.42  >60.00 mL/min Final  . Cholesterol 06/06/2016 112  0 - 200 mg/dL Final  . Triglycerides 06/06/2016 137.0  0.0 - 149.0 mg/dL Final  . HDL 06/06/2016 30.10* >39.00 mg/dL Final  . VLDL 06/06/2016 27.4  0.0 - 40.0 mg/dL Final  . LDL Cholesterol 06/06/2016 55  0 - 99 mg/dL Final  . Total CHOL/HDL Ratio 06/06/2016 4   Final  . NonHDL 06/06/2016 82.02   Final  . Free T4 06/06/2016 0.84  0.60 - 1.60 ng/dL Final   Comment: Specimens from patients who are undergoing biotin therapy and /or ingesting biotin supplements may contain high levels of biotin.  The higher biotin concentration in these specimens interferes with this Free T4 assay.  Specimens that contain high levels  of biotin may cause false high results for this Free T4 assay.  Please interpret results in light of the total clinical presentation of the patient.    . Testosterone 06/06/2016 278.39* 300.00 - 890.00 ng/dL Final  . Prolactin 06/06/2016 5.0  4.0 - 15.2 ng/mL Final  . WBC 06/06/2016 4.7  4.0 - 10.5 K/uL Final  . RBC 06/06/2016 3.41* 4.22 - 5.81 Mil/uL Final  . Platelets 06/06/2016 221.0  150.0 - 400.0 K/uL Final  . Hemoglobin 06/06/2016 9.7* 13.0 - 17.0 g/dL  Final  . HCT 06/06/2016 29.6* 39.0 - 52.0 % Final  . MCV 06/06/2016 86.8  78.0 - 100.0 fl Final  . MCHC 06/06/2016 32.8  30.0 - 36.0 g/dL Final  . RDW 06/06/2016 14.4  11.5 - 15.5 % Final     REVIEW OF SYSTEMS:        Eyes: No known diabetic retinopathy, has had macular edema, followed regularly by ophthalmologist   Hypertension: has had a long history of high blood pressure  Currently on regimen of 10 mg lisinopril, amlodipine 10 mg and low dose metoprolol   NEPHROPATHY: He has mild increase in microalbumin  Creatinine had increased slightly when Invokana was increased up to 300 mg and was persistently high However now creatinine is back to normal    Lab Results  Component Value Date   CREATININE 1.34 06/06/2016   CREATININE 1.58 (H) 01/21/2016   CREATININE 1.57 (H) 12/28/2015   CREATININE 1.59 (H) 10/16/2015    Neuropathy: He has only occasional burning sensations, no significant sharp pains    Diabetic foot exam in 3/17  showed no abnormality    VITAMIN D deficiency: This was done as a screening and it is extremely low at 5.8.  Has been on 5000 U supplements  Lab Results  Component Value Date   VD25OH 5.89 (L) 12/28/2015      PHYSICAL EXAM:  BP 124/78   Pulse 79   Ht 6\' 2"  (1.88 m)   Wt 259 lb (117.5 kg)   SpO2 94%   BMI 33.25 kg/m     ASSESSMENT/PLAN:   DIABETES with mild obesity and previously persistently poor control.  A1c is relatively stable at 7.1 However has gained back  some weight  Home blood sugars are mildly increased but he has not been taking his Invokana and metformin consistently recently because of diarrhea With his trying to do better on his diet and  exercise he has maintained better control Discussed that he can go back to taking Invokana daily now and also consider using a combination of metformin ER and Invokana  Currently does not want to switch to Trulicity, discussed efficacy and safety of this  Macroprolactinoma of the  pituitary gland with very high baseline prolactin level of 1315 He has done  well with Dostinex And normalization of prolactin level Also had reduction in size of the tumor Currently with 0.5 mg twice a week his prolactin is low normal  For now he can try reducing the dose slightly with 0.5 alternating with 0.25 each week Needs to continue follow-up with ophthalmologist also  HYPOGONADISM: This may be related to metabolic syndrome as testosterone level has continued improved very slightly but still below normal, this was done nonfasting in the afternoon Currently is not symptomatic with no fatigue or change in libido, does not desire treatment  The other pituitary gland functions have been normal so far and free T4 also normal   HYPERTENSION: This is  adequately controlled.   Also has a history of increasing microalbumin which needs follow-up  Renal insufficiency: This may be partly related to  Invokana and combining with lisinopril. However he appears to be better now He does have some edema but he thinks this will improve with increasing Invokana to everyday Will continue to monitor  EDEMA: Still tends to have some dependent edema presumably from Norvasc and will reduce this to half tablet  VITAMIN D deficiency: He will continue vitamin D supplements and recheck level on the next visit  ANEMIA: Hemoglobin is below 10 with normal indices, has not followed up with his PCP as directed He agrees to get GI evaluation, most likely will need diet supplement and discussion with his PCP   Follow-up for office visit in  4 months   Patient Instructions  Take Dostinex 1 tab alt with 1/2  Take Invokana daily before Bfst   Counseling time on subjects discussed above is over 50% of today's 25 minute visit  Anallely Rosell 06/13/2016, 12:23 PM   Note: This office note was prepared with Dragon voice recognition system technology. Any transcriptional errors that result from this process are  unintentional.

## 2016-07-03 DIAGNOSIS — H35033 Hypertensive retinopathy, bilateral: Secondary | ICD-10-CM | POA: Diagnosis not present

## 2016-07-03 DIAGNOSIS — E113311 Type 2 diabetes mellitus with moderate nonproliferative diabetic retinopathy with macular edema, right eye: Secondary | ICD-10-CM | POA: Diagnosis not present

## 2016-07-03 DIAGNOSIS — H40003 Preglaucoma, unspecified, bilateral: Secondary | ICD-10-CM | POA: Diagnosis not present

## 2016-07-03 DIAGNOSIS — E113313 Type 2 diabetes mellitus with moderate nonproliferative diabetic retinopathy with macular edema, bilateral: Secondary | ICD-10-CM | POA: Diagnosis not present

## 2016-07-03 DIAGNOSIS — D352 Benign neoplasm of pituitary gland: Secondary | ICD-10-CM | POA: Diagnosis not present

## 2016-07-15 ENCOUNTER — Encounter: Payer: Self-pay | Admitting: Internal Medicine

## 2016-07-22 ENCOUNTER — Other Ambulatory Visit: Payer: Self-pay | Admitting: Endocrinology

## 2016-08-27 ENCOUNTER — Other Ambulatory Visit: Payer: Self-pay | Admitting: Endocrinology

## 2016-08-29 ENCOUNTER — Ambulatory Visit: Payer: BLUE CROSS/BLUE SHIELD | Admitting: Internal Medicine

## 2016-09-03 ENCOUNTER — Telehealth: Payer: Self-pay | Admitting: Internal Medicine

## 2016-09-03 NOTE — Telephone Encounter (Signed)
-----   Message from Audrea Muscat, [place] sent at 09/01/2016  8:38 AM EDT ----- Regarding: RE: pt canceled appt for today 6.1.18 at 11am Thanks.  No charge ----- Message ----- From: Erven Colla Sent: 08/29/2016  10:10 AM To: Audrea Muscat, CMA Subject: pt canceled appt for today 6.1.18 at 11am      Patient states that he is stuck in [place]. Pt resch to first open appt on 7.5.18.

## 2016-09-04 DIAGNOSIS — H35033 Hypertensive retinopathy, bilateral: Secondary | ICD-10-CM | POA: Diagnosis not present

## 2016-09-04 DIAGNOSIS — E113311 Type 2 diabetes mellitus with moderate nonproliferative diabetic retinopathy with macular edema, right eye: Secondary | ICD-10-CM | POA: Diagnosis not present

## 2016-09-04 DIAGNOSIS — E113312 Type 2 diabetes mellitus with moderate nonproliferative diabetic retinopathy with macular edema, left eye: Secondary | ICD-10-CM | POA: Diagnosis not present

## 2016-10-02 ENCOUNTER — Ambulatory Visit (INDEPENDENT_AMBULATORY_CARE_PROVIDER_SITE_OTHER): Payer: BLUE CROSS/BLUE SHIELD | Admitting: Gastroenterology

## 2016-10-02 ENCOUNTER — Ambulatory Visit: Payer: BLUE CROSS/BLUE SHIELD | Admitting: Internal Medicine

## 2016-10-02 ENCOUNTER — Encounter: Payer: Self-pay | Admitting: Gastroenterology

## 2016-10-02 VITALS — BP 154/86 | HR 76 | Ht 71.26 in | Wt 260.4 lb

## 2016-10-02 DIAGNOSIS — D649 Anemia, unspecified: Secondary | ICD-10-CM

## 2016-10-02 DIAGNOSIS — Z800 Family history of malignant neoplasm of digestive organs: Secondary | ICD-10-CM

## 2016-10-02 MED ORDER — NA SULFATE-K SULFATE-MG SULF 17.5-3.13-1.6 GM/177ML PO SOLN: 1.0000 | Freq: Once | ORAL | 0 refills | Status: AC

## 2016-10-02 NOTE — Patient Instructions (Addendum)
If you are age 58 or older, your body mass index should be between 23-30. Your Body mass index is 36.05 kg/m. If this is out of the aforementioned range listed, please consider follow up with your Primary Care Provider.  If you are age 55 or younger, your body mass index should be between 19-25. Your Body mass index is 36.05 kg/m. If this is out of the aformentioned range listed, please consider follow up with your Primary Care Provider.   We have sent the following medications to your pharmacy for you to pick up at your convenience:  [place] have been scheduled for a colonoscopy. Please follow written instructions given to you at your visit today.  Please pick up your prep supplies at the pharmacy within the next 1-3 days. If you use inhalers (even only as needed), please bring them with you on the day of your procedure. Your physician has requested that you go to www.startemmi.com and enter the access code given to you at your visit today. This web site gives a general overview about your procedure. However, you should still follow specific instructions given to you by our office regarding your preparation for the procedure.  Your physician has requested that you go to the basement for the following lab work before leaving today:  Iron Studies

## 2016-10-02 NOTE — Progress Notes (Signed)
HPI :  56 y/o male with a history of DM, HTN, prolactinoma, glaucoma, here for a new patient visit for anemia.   Labs show Hgb of 9.7 in March 2018, MCV 86. Testosterone low at 278 His anemia dates back at last to 2016 - Hgb 11s or so, has slightly downtrended over time. No prior iron studies  He has hypogonadism as result of prolactinoma. Prolactinoma has been treated medically, has not required surgery.   He has a secondary hypogonidism due to this issue, monitoring for now. Testosterone was low 200s and slowly rising.  No prior colonoscopy. He had a flex sig in [place] about 10 years ago, no report available.  No blood in the stools. No trouble with the bowels. No abdominal pains. No weight loss.  Father had colon cancer, signet cell adenocarcinoma. He was diagnosed around age 8 or so with metastatic disease at time of diagnosis. Uncle had colon cancer on mother's side. Mother had colon polyps. Maternal grandfather had colon cancer.  Another uncle had esophageal cancer  He does have some fatigue. No abdominal pains.    Past Medical History:  Diagnosis Date  . Anemia   . Diabetes mellitus without complication ([place])   . H/O hypogonadism   . Hypertension   . Prolactinoma, benign [facility])      Past Surgical History:  Procedure Laterality Date  . NO PAST SURGERIES     Family History  Problem Relation Age of Onset  . Diabetes Mother   . Colon polyps Mother   . Heart disease Mother   . Hypertension Mother   . Colon cancer Father   . Kidney disease Maternal Grandmother   . Colon cancer Maternal Grandfather   . Esophageal cancer Maternal Uncle   . Colon cancer Maternal Uncle    Social History  Substance Use Topics  . Smoking status: Never Smoker  . Smokeless tobacco: Never Used  . Alcohol use No   Current Outpatient Prescriptions  Medication Sig Dispense Refill  . amLODipine (NORVASC) 10 MG tablet Take 10 mg by mouth daily.    Marland Kitchen aspirin EC 81 MG tablet Take 81 mg by mouth  daily.    . cabergoline (DOSTINEX) 0.5 MG tablet TAKE 1 TABLET TWICE A WEEK 10 tablet 3  . glipiZIDE (GLUCOTROL XL) 5 MG 24 hr tablet TAKE 2 TABLETS EVERY DAY WITH BREAKFAST 60 tablet 3  . INVOKANA 300 MG TABS tablet TAKE 1 TABLET (300 MG TOTAL) BY MOUTH DAILY BEFORE BREAKFAST. 30 tablet 2  . isosorbide mononitrate (IMDUR) 60 MG 24 hr tablet Take 60 mg by mouth daily.    Marland Kitchen lisinopril (PRINIVIL,ZESTRIL) 5 MG tablet Take 5 mg by mouth daily.    . metFORMIN (GLUCOPHAGE-XR) 500 MG 24 hr tablet TAKE 4 TABLETS EVERY DAY 120 tablet 2  . metoprolol succinate (TOPROL-XL) 50 MG 24 hr tablet Take 50 mg by mouth daily. Take with or immediately following a meal.    . Na Sulfate-K Sulfate-Mg Sulf 17.5-3.13-1.6 GM/180ML SOLN Take 1 kit by mouth once. 354 mL 0   No current facility-administered medications for this visit.    No Known Allergies   Review of Systems: All systems reviewed and negative except where noted in HPI.   CBC Latest Ref Rng & Units 06/06/2016 12/28/2015 07/02/2015  WBC 4.0 - 10.5 K/uL 4.7 5.1 5.1  Hemoglobin 13.0 - 17.0 g/dL 9.7(L) 10.9(L) 10.8(L)  Hematocrit 39.0 - 52.0 % 29.6(L) 32.5(L) 32.7(L)  Platelets 150.0 - 400.0 K/uL 221.0 197.0  195.0   Lab Results  Component Value Date   ALT 13 06/06/2016   AST 16 06/06/2016   ALKPHOS 77 06/06/2016   BILITOT 0.3 06/06/2016    Lab Results  Component Value Date   CREATININE 1.34 06/06/2016   BUN 23 06/06/2016   NA 138 06/06/2016   K 3.7 06/06/2016   CL 104 06/06/2016   CO2 30 06/06/2016     Physical Exam: BP (!) 154/86 (BP Location: Left Arm, Patient Position: Sitting, Cuff Size: Normal)   Pulse 76   Ht 5' 11.26" (1.81 m) Comment: height measured without shoes  Wt 260 lb 6 oz (118.1 kg)   BMI 36.05 kg/m  Constitutional: Pleasant,well-developed, male in no acute distress. HEENT: Normocephalic and atraumatic. Conjunctivae are normal. No scleral icterus. Neck supple.  Cardiovascular: Normal rate, regular rhythm.    Pulmonary/chest: Effort normal and breath sounds normal anteriorly. No wheezing, rales or rhonchi. Abdominal: Soft, nondistended, nontender. There are no masses palpable. No hepatomegaly. Extremities: no edema Lymphadenopathy: No cervical adenopathy noted. Neurological: Alert and oriented to person place and time. Skin: Skin is warm and dry. No rashes noted. Psychiatric: Normal mood and affect. Behavior is normal.   ASSESSMENT AND PLAN: 56 y/o male with history as outlined above, here for a new patient evaluation for anemia and for colon cancer screening given family history of colon cancer.   He has no GI tract symptoms or alarm symptoms. His anemia is normocytic, based on this it is unlikely to represent iron deficiency, but will check iron studies to ensure normal. It's possible his anemia could be related to hypogonadism otherwise. He is overdue for a screening colonoscopy given his father's history of colon cancer and anemia, and optical colonoscopy is recommended. I discussed the risks / benefits of colonoscopy and anesthesia with him, following this discussion he wished to proceed. If he has an iron deficiency on labs, I would recommend adding an upper endoscopy as well. If he doesn't have an iron deficiency, I don't think EGD would be needed at this time.   He agreed with the plan, further recommendations pending results of labs and endoscopic evaluation. All questions answered.   [place] Cellar, MD [facility] Pager [phone]

## 2016-10-03 ENCOUNTER — Other Ambulatory Visit: Payer: BLUE CROSS/BLUE SHIELD

## 2016-10-03 ENCOUNTER — Other Ambulatory Visit: Payer: Self-pay

## 2016-10-03 MED ORDER — CANAGLIFLOZIN 300 MG PO TABS
300.0000 mg | ORAL_TABLET | Freq: Every day | ORAL | 1 refills | Status: DC
Start: 2016-10-03 — End: 2016-10-06

## 2016-10-06 ENCOUNTER — Other Ambulatory Visit: Payer: Self-pay

## 2016-10-06 MED ORDER — CANAGLIFLOZIN 300 MG PO TABS: 300.0000 mg | ORAL_TABLET | Freq: Every day | ORAL | 1 refills | Status: DC

## 2016-10-07 ENCOUNTER — Other Ambulatory Visit (INDEPENDENT_AMBULATORY_CARE_PROVIDER_SITE_OTHER): Payer: BLUE CROSS/BLUE SHIELD

## 2016-10-07 DIAGNOSIS — Z800 Family history of malignant neoplasm of digestive organs: Secondary | ICD-10-CM

## 2016-10-07 DIAGNOSIS — E559 Vitamin D deficiency, unspecified: Secondary | ICD-10-CM

## 2016-10-07 DIAGNOSIS — E1165 Type 2 diabetes mellitus with hyperglycemia: Secondary | ICD-10-CM | POA: Diagnosis not present

## 2016-10-07 DIAGNOSIS — E230 Hypopituitarism: Secondary | ICD-10-CM

## 2016-10-07 DIAGNOSIS — D649 Anemia, unspecified: Secondary | ICD-10-CM | POA: Diagnosis not present

## 2016-10-07 DIAGNOSIS — D6489 Other specified anemias: Secondary | ICD-10-CM | POA: Diagnosis not present

## 2016-10-07 DIAGNOSIS — D352 Benign neoplasm of pituitary gland: Secondary | ICD-10-CM

## 2016-10-07 LAB — COMPREHENSIVE METABOLIC PANEL
ALBUMIN: 3.8 g/dL (ref 3.5–5.2)
ALK PHOS: 73 U/L (ref 39–117)
ALT: 15 U/L (ref 0–53)
AST: 19 U/L (ref 0–37)
BILIRUBIN TOTAL: 0.4 mg/dL (ref 0.2–1.2)
BUN: 32 mg/dL — ABNORMAL HIGH (ref 6–23)
CALCIUM: 9.2 mg/dL (ref 8.4–10.5)
CO2: 25 mEq/L (ref 19–32)
Chloride: 105 mEq/L (ref 96–112)
Creatinine, Ser: 1.72 mg/dL — ABNORMAL HIGH (ref 0.40–1.50)
GFR: 52.73 mL/min — AB (ref >60.00)
Glucose, Bld: 177 mg/dL — ABNORMAL HIGH (ref 70–99)
POTASSIUM: 3.8 meq/L (ref 3.5–5.1)
Sodium: 138 mEq/L (ref 135–145)
TOTAL PROTEIN: 7.5 g/dL (ref 6.0–8.3)

## 2016-10-07 LAB — CBC
HCT: 34.5 % — ABNORMAL LOW (ref 39.0–52.0)
Hemoglobin: 11.6 g/dL — ABNORMAL LOW (ref 13.0–17.0)
MCHC: 33.5 g/dL (ref 30.0–36.0)
MCV: 88.4 fl (ref 78.0–100.0)
Platelets: 207 10*3/uL (ref 150.0–400.0)
RBC: 3.91 Mil/uL — AB (ref 4.22–5.81)
RDW: 13.2 % (ref 11.5–15.5)
WBC: 4.8 10*3/uL (ref 4.0–10.5)

## 2016-10-07 LAB — T4, FREE: FREE T4: 0.8 ng/dL (ref 0.60–1.60)

## 2016-10-07 LAB — HEMOGLOBIN A1C: HEMOGLOBIN A1C: 8 % — AB (ref 4.6–6.5)

## 2016-10-07 LAB — VITAMIN D 25 HYDROXY (VIT D DEFICIENCY, FRACTURES): VITD: 17.11 ng/mL — AB (ref 30.00–100.00)

## 2016-10-07 LAB — TESTOSTERONE: Testosterone: 320.93 ng/dL (ref 300.00–890.00)

## 2016-10-07 LAB — FERRITIN: FERRITIN: 446.8 ng/mL — AB (ref 22.0–322.0)

## 2016-10-08 LAB — PROLACTIN: PROLACTIN: 3.1 ng/mL — AB (ref 4.0–15.2)

## 2016-10-08 LAB — IRON AND TIBC
%SAT: 22 % (ref 15–60)
IRON: 56 ug/dL (ref 50–180)
TIBC: 255 ug/dL (ref 250–425)
UIBC: 199 ug/dL

## 2016-10-09 ENCOUNTER — Encounter: Payer: Self-pay | Admitting: Gastroenterology

## 2016-10-10 ENCOUNTER — Encounter: Payer: Self-pay | Admitting: Endocrinology

## 2016-10-10 ENCOUNTER — Encounter: Payer: BLUE CROSS/BLUE SHIELD | Admitting: Dietician

## 2016-10-10 ENCOUNTER — Ambulatory Visit (INDEPENDENT_AMBULATORY_CARE_PROVIDER_SITE_OTHER): Payer: BLUE CROSS/BLUE SHIELD | Admitting: Endocrinology

## 2016-10-10 VITALS — BP 150/96 | HR 78 | Ht 71.26 in | Wt 271.2 lb

## 2016-10-10 DIAGNOSIS — E1165 Type 2 diabetes mellitus with hyperglycemia: Secondary | ICD-10-CM

## 2016-10-10 DIAGNOSIS — I10 Essential (primary) hypertension: Secondary | ICD-10-CM | POA: Diagnosis not present

## 2016-10-10 DIAGNOSIS — D352 Benign neoplasm of pituitary gland: Secondary | ICD-10-CM

## 2016-10-10 DIAGNOSIS — R809 Proteinuria, unspecified: Secondary | ICD-10-CM

## 2016-10-10 DIAGNOSIS — N289 Disorder of kidney and ureter, unspecified: Secondary | ICD-10-CM | POA: Diagnosis not present

## 2016-10-10 LAB — MICROALBUMIN / CREATININE URINE RATIO
Creatinine,U: 119.1 mg/dL
MICROALB UR: 90.6 mg/dL — AB (ref 0.0–1.9)
Microalb Creat Ratio: 76.1 mg/g — ABNORMAL HIGH (ref 0.0–30.0)

## 2016-10-10 MED ORDER — SEMAGLUTIDE(0.25 OR 0.5MG/DOS) 2 MG/1.5ML ~~LOC~~ SOPN: 0.5000 mg | PEN_INJECTOR | SUBCUTANEOUS | 3 refills | Status: DC

## 2016-10-10 NOTE — Patient Instructions (Addendum)
Invokana replaced by [employer] with the pen as shown once weekly on the same day of the week. 0.25mg  for 2 shots then 0.5mg  weekly   You may inject in the stomach, thigh or arm as indicated in the brochure given.  You will feel fullness of the stomach with starting the medication and should try to keep the portions at meals small.  You may experience nausea in the first few days which usually gets better over time    Dostinex 1/2 tab 2 x weekly

## 2016-10-10 NOTE — Progress Notes (Signed)
Patient ID: Roberto [name], male   DOB: 1961-02-11, 56 y.o.   MRN: [id number]   Chief complaint: Follow-up of multiple endocrine problems  History of Present Illness:  PROBLEM 1: Pituitary tumor  His MRI on 07/18/14 showed a pituitary tumor with the following description: 2.3 x 2 x 1.8 cm mass centered in the sella with suprasellar extension and cavernous sinus extension greater on the left with an appearance most suggestive of pituitary macroadenoma. This impresses upon the optic chiasm  Had difficulty with peripheral vision on the left side but this has resolved He being followed by ophthalmologist periodically for this and macular edema  Has has had a follow-up MRI in 2/17 showing focal area of delayed enhancement this represents a pituitary adenoma on the left.  This lesion measures 15 x 12 x 11 mm.  There is deviation of the pituitary stalk to the right. There is no definite cavernous sinus invasion. The lesion does not contact the optic Chiasm  No current headaches  PROBLEM 2:  Hyperprolactinemia:  At baseline he had a prolactin level checked and this was markedly increased at 1312 He had symptoms of decreased libido and gynecomastia Testosterone level was low at 70  He  was started on Dostinex 0.5 mg initially half tablet twice a week and then 1 tablet twice a week on his initial consultation on 08/04/14 He is tolerating the Dostinex well. Prolactin has been consistently normal since 7/16  Follow-up as shown progressively lower prolactin levels and his dose has been gradually reduced Now taking 1 tablet alternating with half each week Prolactin is again lower and now below normal for the first time   Lab Results  Component Value Date   PROLACTIN 3.1 (L) 10/07/2016   PROLACTIN 5.0 06/06/2016   PROLACTIN 22.6 (H) 12/28/2015   PROLACTIN 5.9 10/16/2015   PROLACTIN 7.2 07/02/2015   . HYPOGONADISM:   He has not been on any supplements for his hypogonadism which  initially had been severe Recently he does not complain of fatigue and is able to exercise His testosterone level is improving progressively and now over 300   Lab Results  Component Value Date   TESTOSTERONE 320.93 10/07/2016    PROBLEM 3:  DIABETES type II with obesity    He was diagnosed  in 2014  and has been Previously managed by a primary care physician who he works with. On admission to the hospital in 2016 his glucose was 380 without any evidence of ketosis  More recently his A1c Has gone up to 8%, previously 7.1  NON-insulin hypoglycemic drugs currently used: INVOKANA 300 mg daily, metformin ER 2000 mg daily, glipizide ER 5 mg daily  Current management, blood sugar patterns and problems identified:  He again did not bring his monitor for download  He says that he has gained weight because of living muscle from exercise  He has seen the dietitian a couple of friends and generally thinks his diet is fairly good  However even with Invokana he has not lost weight  His blood sugars are probably higher postprandial which he may not be checking much, lab glucose 177  By recall his blood sugars are as follows: 140 average fasting and under 200 after meals  Weight history:  Wt Readings from Last 3 Encounters:  10/10/16 271 lb 3.2 oz (123 kg)  10/02/16 260 lb 6 oz (118.1 kg)  06/13/16 259 lb (117.5 kg)    Lab Results  Component Value Date   HGBA1C  8.0 (H) 10/07/2016   HGBA1C 7.1 (H) 06/06/2016   HGBA1C 7.1 (H) 12/28/2015   Lab Results  Component Value Date   MICROALBUR 90.6 (H) 10/10/2016   LDLCALC 55 06/06/2016   CREATININE 1.72 (H) 10/07/2016    OTHER active problems and review of systems    Past Medical History:  Diagnosis Date  . Anemia   . Diabetes mellitus without complication ([place])   . H/O hypogonadism   . Hypertension   . Prolactinoma, benign [facility])     Past Surgical History:  Procedure Laterality Date  . NO PAST SURGERIES      Family  History  Problem Relation Age of Onset  . Diabetes Mother   . Colon polyps Mother   . Heart disease Mother   . Hypertension Mother   . Colon cancer Father   . Kidney disease Maternal Grandmother   . Colon cancer Maternal Grandfather   . Esophageal cancer Maternal Uncle   . Colon cancer Maternal Uncle     Social History:  reports that he has never smoked. He has never used smokeless tobacco. He reports that he does not drink alcohol or use drugs.  Allergies: No Known Allergies  Allergies as of 10/10/2016   No Known Allergies     Medication List       Accurate as of 10/10/16 11:59 PM. Always use your most recent med list.          amLODipine 10 MG tablet Commonly known as:  NORVASC Take 10 mg by mouth daily.   aspirin EC 81 MG tablet Take 81 mg by mouth daily.   cabergoline 0.5 MG tablet Commonly known as:  DOSTINEX TAKE 1 TABLET TWICE A WEEK   glipiZIDE 5 MG 24 hr tablet Commonly known as:  GLUCOTROL XL TAKE 2 TABLETS EVERY DAY WITH BREAKFAST   isosorbide mononitrate 60 MG 24 hr tablet Commonly known as:  IMDUR Take 60 mg by mouth daily.   lisinopril 5 MG tablet Commonly known as:  PRINIVIL,ZESTRIL Take 5 mg by mouth daily.   metFORMIN 500 MG 24 hr tablet Commonly known as:  GLUCOPHAGE-XR TAKE 4 TABLETS EVERY DAY   metoprolol succinate 50 MG 24 hr tablet Commonly known as:  TOPROL-XL Take 50 mg by mouth daily. Take with or immediately following a meal.   Semaglutide 0.25 or 0.5 MG/DOSE Sopn Commonly known as:  OZEMPIC Inject 0.5 mg into the skin once a week. Start with 0.25mg  for 1st 2 shots       LABS:  Lab on 10/07/2016  Component Date Value Ref Range Status  . Ferritin 10/07/2016 446.8* 22.0 - 322.0 ng/mL Final  Lab on 10/07/2016  Component Date Value Ref Range Status  . Hgb A1c MFr Bld 10/07/2016 8.0* 4.6 - 6.5 % Final   Glycemic Control Guidelines for People with Diabetes:Non Diabetic:  <6%Goal of Therapy: <7%Additional Action Suggested:   >8%   . Sodium 10/07/2016 138  135 - 145 mEq/L Final  . Potassium 10/07/2016 3.8  3.5 - 5.1 mEq/L Final  . Chloride 10/07/2016 105  96 - 112 mEq/L Final  . CO2 10/07/2016 25  19 - 32 mEq/L Final  . Glucose, Bld 10/07/2016 177* 70 - 99 mg/dL Final  . BUN 10/07/2016 32* 6 - 23 mg/dL Final  . Creatinine, Ser 10/07/2016 1.72* 0.40 - 1.50 mg/dL Final  . Total Bilirubin 10/07/2016 0.4  0.2 - 1.2 mg/dL Final  . Alkaline Phosphatase 10/07/2016 73  39 - 117 U/L Final  .  AST 10/07/2016 19  0 - 37 U/L Final  . ALT 10/07/2016 15  0 - 53 U/L Final  . Total Protein 10/07/2016 7.5  6.0 - 8.3 g/dL Final  . Albumin 10/07/2016 3.8  3.5 - 5.2 g/dL Final  . Calcium 10/07/2016 9.2  8.4 - 10.5 mg/dL Final  . GFR 10/07/2016 52.73* >60.00 mL/min Final  . Free T4 10/07/2016 0.80  0.60 - 1.60 ng/dL Final   Comment: Specimens from patients who are undergoing biotin therapy and /or ingesting biotin supplements may contain high levels of biotin.  The higher biotin concentration in these specimens interferes with this Free T4 assay.  Specimens that contain high levels  of biotin may cause false high results for this Free T4 assay.  Please interpret results in light of the total clinical presentation of the patient.    . Testosterone 10/07/2016 320.93  300.00 - 890.00 ng/dL Final  . Prolactin 10/07/2016 3.1* 4.0 - 15.2 ng/mL Final  . WBC 10/07/2016 4.8  4.0 - 10.5 K/uL Final  . RBC 10/07/2016 3.91* 4.22 - 5.81 Mil/uL Final  . Platelets 10/07/2016 207.0  150.0 - 400.0 K/uL Final  . Hemoglobin 10/07/2016 11.6* 13.0 - 17.0 g/dL Final  . HCT 10/07/2016 34.5* 39.0 - 52.0 % Final  . MCV 10/07/2016 88.4  78.0 - 100.0 fl Final  . MCHC 10/07/2016 33.5  30.0 - 36.0 g/dL Final  . RDW 10/07/2016 13.2  11.5 - 15.5 % Final  . VITD 10/07/2016 17.11* 30.00 - 100.00 ng/mL Final  . Iron 10/07/2016 56  50 - 180 ug/dL Final  . UIBC 10/07/2016 199  ug/dL Final  . TIBC 10/07/2016 255  250 - 425 ug/dL Final  . %SAT 10/07/2016 22  15 -  60 % Final  . Microalb, Ur 10/10/2016 90.6* 0.0 - 1.9 mg/dL Final  . Creatinine,U 10/10/2016 119.1  mg/dL Final  . Microalb Creat Ratio 10/10/2016 76.1* 0.0 - 30.0 mg/g Final     REVIEW OF SYSTEMS:        Eyes: No known diabetic retinopathy, has had macular edema, followed regularly by ophthalmologist   Hypertension: has had a long history of high blood pressure  Currently on regimen of 10 mg lisinopril, amlodipine 5 mg and 50 mg dose dose metoprolol Also in Invokana Blood pressure is higher today However he thinks his blood pressure has been about 130/70-75 including yesterday He also tends to have edema and Norvasc was reduced on his last visit  NEPHROPATHY: He has mild persistent increase in microalbumin  Creatinine had increased slightly when Invokana was increased up to 300 mg  Although subsequently it had come down it is back up to 1.7 now    Lab Results  Component Value Date   CREATININE 1.72 (H) 10/07/2016   CREATININE 1.34 06/06/2016   CREATININE 1.58 (H) 01/21/2016   CREATININE 1.57 (H) 12/28/2015      Diabetic foot exam in 3/17  showed no abnormality    VITAMIN D deficiency: This was done as a screening and it is extremely low at 5.8.  Has been on 5000 U supplements  Lab Results  Component Value Date   VD25OH 17.11 (L) 10/07/2016   VD25OH 5.89 (L) 12/28/2015      PHYSICAL EXAM:  BP (!) 150/96   Pulse 78   Ht 5' 11.26" (1.81 m)   Wt 271 lb 3.2 oz (123 kg)   SpO2 96%   BMI 37.55 kg/m   REPEAT blood pressure: 150/86   ASSESSMENT/PLAN:  DIABETES with mild obesity and previously persistently poor control.  See history of present illness for detailed discussion of current diabetes management, blood sugar patterns and problems identified  A1c is higher at 8% He has difficulty losing weight although he thinks his weight is up because of exercising and weight training Not getting benefit from Solana with either blood sugar control or weight loss  currently Also likely not responding much to metformin and glipizide which have been before for some time He has not checked his blood sugars enough to get a pattern but likely has higher postprandial readings, lab glucose 177  He is a good candidate for medication like Ozempic  Discussed with the patient the nature of GLP-1 drugs, the action on various organ systems, how they benefit blood glucose control, as well as the benefit of weight loss and  increase satiety . Explained possible side effects, particularly nausea and vomiting that usually resolve over time; discussed safety information in package insert. Demonstrated the medication injection device and injection technique to the patient.  Showed patient where to inject the medication. To start with 0.25 mg dosage weekly for the first 2 weeks and then 0.5 mg weekly Patient brochure on [employer] with enclosed co-pay card given He will call if blood sugars are not well-controlled   Macroprolactinoma of the pituitary gland with very high baseline prolactin level of 1315 He has done  well with Dostinex And normalization of prolactin level Also previously had reduction in size of the tumor Currently with 0.5 mg alternating with 0.25 mg in a week  Now his prolactin is low   For now he can try reducing the dose with taking 0.25 mg twice a week now  Needs to continue follow-up with neurosurgeon and probably needs another MRI  HYPO GONADISM:   His testosterone level is now over 300 any is asymptomatic, this has improved with treatment of his hyperprolactinemia Will continue to monitor  Has had no evidence of secondary hypothyroidism   HYPERTENSION: This is not adequately controlled at this time especially with office readings.   Also has a history of increasing microalbumin  He may have diabetic nephropathy along with hypertensive kidney disease Does need a nephrology consultation now   VITAMIN D deficiency: He will continue vitamin  D supplements   ANEMIA: Hemoglobin is improved without iron deficiency He will follow-up with gastroenterologist and hematologist  Follow-up for office visit in  4 months   Patient Instructions  Invokana replaced by [facility] with the pen as shown once weekly on the same day of the week. 0.25mg  for 2 shots then 0.5mg  weekly   You may inject in the stomach, thigh or arm as indicated in the brochure given.  You will feel fullness of the stomach with starting the medication and should try to keep the portions at meals small.  You may experience nausea in the first few days which usually gets better over time    Dostinex 1/2 tab 2 x weekly          Total visit time for evaluation and management of multiple problems = 25 minutes  Roberto [name] 10/12/2016, 10:01 PM   Note: This office note was prepared with Estate agent. Any transcriptional errors that result from this process are unintentional.

## 2016-10-16 DIAGNOSIS — H43823 Vitreomacular adhesion, bilateral: Secondary | ICD-10-CM | POA: Diagnosis not present

## 2016-10-16 DIAGNOSIS — E113313 Type 2 diabetes mellitus with moderate nonproliferative diabetic retinopathy with macular edema, bilateral: Secondary | ICD-10-CM | POA: Diagnosis not present

## 2016-10-16 DIAGNOSIS — E113311 Type 2 diabetes mellitus with moderate nonproliferative diabetic retinopathy with macular edema, right eye: Secondary | ICD-10-CM | POA: Diagnosis not present

## 2016-11-06 ENCOUNTER — Encounter: Payer: Self-pay | Admitting: Gastroenterology

## 2016-11-07 ENCOUNTER — Other Ambulatory Visit: Payer: Self-pay

## 2016-11-07 ENCOUNTER — Telehealth: Payer: Self-pay | Admitting: Gastroenterology

## 2016-11-07 MED ORDER — NA SULFATE-K SULFATE-MG SULF 17.5-3.13-1.6 GM/177ML PO SOLN: 1.0000 | Freq: Once | ORAL | 0 refills | Status: AC

## 2016-11-07 NOTE — Telephone Encounter (Signed)
I did not see where Suprep was sent. Suprep sent to [facility] and rebate card faxed as well.

## 2016-11-13 ENCOUNTER — Ambulatory Visit (AMBULATORY_SURGERY_CENTER): Payer: BLUE CROSS/BLUE SHIELD | Admitting: Gastroenterology

## 2016-11-13 ENCOUNTER — Encounter: Payer: Self-pay | Admitting: Gastroenterology

## 2016-11-13 VITALS — BP 145/79 | HR 70 | Temp 97.7°F | Resp 16 | Ht 71.0 in | Wt 260.0 lb

## 2016-11-13 DIAGNOSIS — D649 Anemia, unspecified: Secondary | ICD-10-CM | POA: Diagnosis not present

## 2016-11-13 DIAGNOSIS — Z1212 Encounter for screening for malignant neoplasm of rectum: Secondary | ICD-10-CM | POA: Diagnosis not present

## 2016-11-13 DIAGNOSIS — D123 Benign neoplasm of transverse colon: Secondary | ICD-10-CM | POA: Diagnosis not present

## 2016-11-13 DIAGNOSIS — Z1211 Encounter for screening for malignant neoplasm of colon: Secondary | ICD-10-CM

## 2016-11-13 DIAGNOSIS — Z800 Family history of malignant neoplasm of digestive organs: Secondary | ICD-10-CM

## 2016-11-13 MED ORDER — SODIUM CHLORIDE 0.9 % IV SOLN: 500.0000 mL | INTRAVENOUS | Status: DC

## 2016-11-13 NOTE — Patient Instructions (Addendum)
YOU HAD AN ENDOSCOPIC PROCEDURE TODAY AT THE [facility]:   Refer to the procedure report that was given to you for any specific questions about what was found during the examination.  If the procedure report does not answer your questions, please call your gastroenterologist to clarify.  If you requested that your care partner not be given the details of your procedure findings, then the procedure report has been included in a sealed envelope for you to review at your convenience later.  YOU SHOULD EXPECT: Some feelings of bloating in the abdomen. Passage of more gas than usual.  Walking can help get rid of the air that was put into your GI tract during the procedure and reduce the bloating. If you had a lower endoscopy (such as a colonoscopy or flexible sigmoidoscopy) you may notice spotting of blood in your stool or on the toilet paper. If you underwent a bowel prep for your procedure, you may not have a normal bowel movement for a few days.  Please Note:  You might notice some irritation and congestion in your nose or some drainage.  This is from the oxygen used during your procedure.  There is no need for concern and it should clear up in a day or so.  SYMPTOMS TO REPORT IMMEDIATELY:   Following lower endoscopy (colonoscopy or flexible sigmoidoscopy):  Excessive amounts of blood in the stool  Significant tenderness or worsening of abdominal pains  Swelling of the abdomen that is new, acute  Fever of 100F or higher  For urgent or emergent issues, a gastroenterologist can be reached at any hour by calling [phone].   DIET:  We do recommend a small meal at first, but then you may proceed to your regular diet.  Drink plenty of fluids but you should avoid alcoholic beverages for 24 hours.  MEDICATIONS: Continue present medications.  Please see handouts given to you by your recovery nurse.  ACTIVITY:  You should plan to take it easy for the rest of today and you should NOT  DRIVE or use heavy machinery until tomorrow (because of the sedation medicines used during the test).    FOLLOW UP: Our staff will call the number listed on your records the next business day following your procedure to check on you and address any questions or concerns that you may have regarding the information given to you following your procedure. If we do not reach you, we will leave a message.  However, if you are feeling well and you are not experiencing any problems, there is no need to return our call.  We will assume that you have returned to your regular daily activities without incident.  If any biopsies were taken you will be contacted by phone or by letter within the next 1-3 weeks.  Please call us at [phone] if you have not heard about the biopsies in 3 weeks.   Thank you for allowing us to provide for your healthcare needs today.   SIGNATURES/CONFIDENTIALITY: You and/or your care partner have signed paperwork which will be entered into your electronic medical record.  These signatures attest to the fact that that the information above on your After Visit Summary has been reviewed and is understood.  Full responsibility of the confidentiality of this discharge information lies with you and/or your care-partner. 

## 2016-11-13 NOTE — Progress Notes (Signed)
Called to room to assist during endoscopic procedure.  Patient ID and intended procedure confirmed with present staff. Received instructions for my participation in the procedure from the performing physician.  

## 2016-11-13 NOTE — Op Note (Signed)
Roberto [name] Patient Name: Roberto [name] Procedure Date: 11/13/2016 3:11 PM MRN: [id number] Endoscopist: Remo Lipps P. Ottilia Pippenger MD, MD Age: 56 Referring MD:  Date of Birth: April 06, 1960 Gender: Male Account #: [id number] Procedure:                Colonoscopy Indications:              Screening patient at increased risk: Family history                            of 1st-degree relative with colorectal cancer                            (father diagnosed age 58), first time exam Medicines:                Monitored Anesthesia Care Procedure:                Pre-Anesthesia Assessment:                           - Prior to the procedure, a History and Physical                            was performed, and patient medications and                            allergies were reviewed. The patient's tolerance of                            previous anesthesia was also reviewed. The risks                            and benefits of the procedure and the sedation                            options and risks were discussed with the patient.                            All questions were answered, and informed consent                            was obtained. Prior Anticoagulants: The patient has                            taken aspirin, last dose was 1 day prior to                            procedure. ASA Grade Assessment: II - A patient                            with mild systemic disease. After reviewing the                            risks and benefits, the patient was deemed in  satisfactory condition to undergo the procedure.                           After obtaining informed consent, the colonoscope                            was passed under direct vision. Throughout the                            procedure, the patient's blood pressure, pulse, and                            oxygen saturations were monitored continuously. The                            Colonoscope was  introduced through the anus and                            advanced to the the cecum, identified by                            appendiceal orifice and ileocecal valve. The                            colonoscopy was performed without difficulty. The                            patient tolerated the procedure well. The quality                            of the bowel preparation was adequate. The                            ileocecal valve, appendiceal orifice, and rectum                            were photographed. Scope In: 3:20:12 PM Scope Out: 3:47:23 PM Scope Withdrawal Time: 0 hours 23 minutes 3 seconds  Total Procedure Duration: 0 hours 27 minutes 11 seconds  Findings:                 The perianal and digital rectal examinations were                            normal.                           A 3 mm polyp was found in the transverse colon. The                            polyp was sessile. The polyp was removed with a                            cold snare. Resection and retrieval were complete.  Small internal hemorrhoids were found during                            retroflexion.                           A large amount of liquid stool was found in the                            entire colon, making visualization difficult.                            Several minutes were spent lavaging the colon using                            copious amounts of sterile water, resulting in                            clearance with adequate visualization.                           The exam was otherwise without abnormality. Complications:            No immediate complications. Estimated blood loss:                            Minimal. Estimated Blood Loss:     Estimated blood loss was minimal. Impression:               - One 3 mm polyp in the transverse colon, removed                            with a cold snare. Resected and retrieved.                           - Small  internal hemorrhoids.                           - The examination was otherwise normal. Recommendation:           - Patient has a contact number available for                            emergencies. The signs and symptoms of potential                            delayed complications were discussed with the                            patient. Return to normal activities tomorrow.                            Written discharge instructions were provided to the                            patient.                           -  Resume previous diet.                           - Continue present medications.                           - Await pathology results.                           - Repeat colonoscopy is recommended for                            surveillance. The colonoscopy date will be                            determined after pathology results from today's                            exam become available for review. Remo Lipps P. Keishon Chavarin MD, MD 11/13/2016 3:52:30 PM This report has been signed electronically.

## 2016-11-14 ENCOUNTER — Telehealth: Payer: Self-pay

## 2016-11-14 ENCOUNTER — Telehealth: Payer: Self-pay | Admitting: *Deleted

## 2016-11-14 NOTE — Telephone Encounter (Signed)
Unable to leave message no voicemail available for follow up call. SM

## 2016-11-14 NOTE — Telephone Encounter (Signed)
Attempted to reach pt. For follow up call at home and cell no.   Unable to leave message at either no.   Will try to reach pt. Later today.

## 2016-11-20 ENCOUNTER — Encounter: Payer: Self-pay | Admitting: Gastroenterology

## 2016-11-27 DIAGNOSIS — E113313 Type 2 diabetes mellitus with moderate nonproliferative diabetic retinopathy with macular edema, bilateral: Secondary | ICD-10-CM | POA: Diagnosis not present

## 2016-11-27 DIAGNOSIS — E113311 Type 2 diabetes mellitus with moderate nonproliferative diabetic retinopathy with macular edema, right eye: Secondary | ICD-10-CM | POA: Diagnosis not present

## 2016-11-30 ENCOUNTER — Other Ambulatory Visit: Payer: Self-pay | Admitting: Endocrinology

## 2016-12-03 ENCOUNTER — Other Ambulatory Visit: Payer: Self-pay | Admitting: Endocrinology

## 2016-12-03 DIAGNOSIS — E669 Obesity, unspecified: Principal | ICD-10-CM

## 2016-12-03 DIAGNOSIS — E1169 Type 2 diabetes mellitus with other specified complication: Secondary | ICD-10-CM

## 2016-12-04 ENCOUNTER — Encounter: Payer: BLUE CROSS/BLUE SHIELD | Attending: Endocrinology | Admitting: Dietician

## 2016-12-04 DIAGNOSIS — E119 Type 2 diabetes mellitus without complications: Secondary | ICD-10-CM | POA: Diagnosis not present

## 2016-12-04 DIAGNOSIS — E1101 Type 2 diabetes mellitus with hyperosmolarity with coma: Secondary | ICD-10-CM

## 2016-12-04 DIAGNOSIS — Z713 Dietary counseling and surveillance: Secondary | ICD-10-CM | POA: Diagnosis not present

## 2016-12-04 NOTE — Patient Instructions (Signed)
Continue your healthy lifestyle improvements! Consider increasing your non-starchy vegetable intake. Consider adding legumes, whole grains, and other high fiber choices. Consider iron sources with vitamin C.

## 2016-12-04 NOTE — Progress Notes (Signed)
  Medical Nutrition Therapy:  Appt start time: 1550 end time:  1630.  Assessment:  Primary concerns today: Patient is here today alone for follow up of type 2 Diabetes and weight concerns. He states that since last visit (12/28/15) he has reduced his protein intake and increased his intake of vegetables a little.  He still has a low overall carbohydrate intake.  He is concerned regarding his kidney function.  His last A1C was 8% 10/07/16 and BUN 32, Creatinine 1.72 with GFR of 52.  Weight per patient increased from 249 lbs to 260 lbs after Invokana was stopped and is around 254 lbs today.   He states that he would like to be weighed on the body composition at his next appointment.  He is now sleeping about 10 hours per day and walks about 2 miles most days.  His diet is very rigid and lacks variety.  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  MEDICATIONS: See list to include Ozempic, Glucophage XR, and Glucotrol XL Vitamin D.  DIETARY INTAKE:  24-hr recall:  B ( AM): SKIPS  Snk ( AM) :  none  L ( PM): chicken or fish and vegetables  Snk ( PM): none D ( PM): chicken or fish and vegetables  Snk ( PM): none Beverages: water  Recent physical activity: 2 miles walking daily   Progress Towards Goal(s):  In progress.   Nutritional Diagnosis:  NB-1.1 Food and nutrition-related knowledge deficit As related to balance of carbohydrate, protein, and fat.  As evidenced by diet hx.    Intervention:  Nutrition counseling/education continued.  Discussed diet variety, benefits of increasing non starchy vegetable intake and suggested small amounts of carbohydrate (legumes, whole grains or other high fiber choices) to be added with each meal.  Discussed sources of iron and need to include vitamin C rich foods to increase absorption.  He does not drink tea which can block absorption.  Discussed vitamin D further.  He will continue to supplement.  He does not want to take a MVI at this time and is  concerned about salads due to increased incidence of food born illness where he lives.  This was discussed.  Continue your healthy lifestyle improvements! Consider increasing your non-starchy vegetable intake. Consider adding legumes, whole grains, and other high fiber choices. Consider iron sources with vitamin C  Handouts given during visit include:  None today  Monitoring/Evaluation:  Dietary intake, exercise, , and body weight in 2 month(s).

## 2016-12-08 DIAGNOSIS — H40013 Open angle with borderline findings, low risk, bilateral: Secondary | ICD-10-CM | POA: Diagnosis not present

## 2017-01-06 DIAGNOSIS — H5347 Heteronymous bilateral field defects: Secondary | ICD-10-CM | POA: Diagnosis not present

## 2017-01-06 DIAGNOSIS — H40013 Open angle with borderline findings, low risk, bilateral: Secondary | ICD-10-CM | POA: Diagnosis not present

## 2017-01-06 DIAGNOSIS — H3589 Other specified retinal disorders: Secondary | ICD-10-CM | POA: Diagnosis not present

## 2017-01-06 DIAGNOSIS — Z7984 Long term (current) use of oral hypoglycemic drugs: Secondary | ICD-10-CM | POA: Diagnosis not present

## 2017-01-06 DIAGNOSIS — E113313 Type 2 diabetes mellitus with moderate nonproliferative diabetic retinopathy with macular edema, bilateral: Secondary | ICD-10-CM | POA: Diagnosis not present

## 2017-01-06 DIAGNOSIS — D352 Benign neoplasm of pituitary gland: Secondary | ICD-10-CM | POA: Diagnosis not present

## 2017-01-09 ENCOUNTER — Other Ambulatory Visit: Payer: BLUE CROSS/BLUE SHIELD

## 2017-01-15 DIAGNOSIS — H43821 Vitreomacular adhesion, right eye: Secondary | ICD-10-CM | POA: Diagnosis not present

## 2017-01-15 DIAGNOSIS — E113311 Type 2 diabetes mellitus with moderate nonproliferative diabetic retinopathy with macular edema, right eye: Secondary | ICD-10-CM | POA: Diagnosis not present

## 2017-01-15 DIAGNOSIS — E113313 Type 2 diabetes mellitus with moderate nonproliferative diabetic retinopathy with macular edema, bilateral: Secondary | ICD-10-CM | POA: Diagnosis not present

## 2017-01-16 ENCOUNTER — Ambulatory Visit: Payer: BLUE CROSS/BLUE SHIELD | Admitting: Endocrinology

## 2017-01-16 ENCOUNTER — Ambulatory Visit: Payer: BLUE CROSS/BLUE SHIELD | Admitting: Dietician

## 2017-01-30 ENCOUNTER — Ambulatory Visit (INDEPENDENT_AMBULATORY_CARE_PROVIDER_SITE_OTHER): Payer: BLUE CROSS/BLUE SHIELD | Admitting: Endocrinology

## 2017-01-30 ENCOUNTER — Encounter: Payer: BLUE CROSS/BLUE SHIELD | Attending: Endocrinology | Admitting: Dietician

## 2017-01-30 ENCOUNTER — Encounter: Payer: Self-pay | Admitting: Endocrinology

## 2017-01-30 VITALS — BP 142/78 | HR 81 | Ht 71.26 in | Wt 254.8 lb

## 2017-01-30 DIAGNOSIS — D352 Benign neoplasm of pituitary gland: Secondary | ICD-10-CM | POA: Diagnosis not present

## 2017-01-30 DIAGNOSIS — E559 Vitamin D deficiency, unspecified: Secondary | ICD-10-CM | POA: Diagnosis not present

## 2017-01-30 DIAGNOSIS — Z713 Dietary counseling and surveillance: Secondary | ICD-10-CM | POA: Insufficient documentation

## 2017-01-30 DIAGNOSIS — N289 Disorder of kidney and ureter, unspecified: Secondary | ICD-10-CM

## 2017-01-30 DIAGNOSIS — I10 Essential (primary) hypertension: Secondary | ICD-10-CM

## 2017-01-30 DIAGNOSIS — E1165 Type 2 diabetes mellitus with hyperglycemia: Secondary | ICD-10-CM | POA: Diagnosis not present

## 2017-01-30 DIAGNOSIS — E119 Type 2 diabetes mellitus without complications: Secondary | ICD-10-CM | POA: Insufficient documentation

## 2017-01-30 LAB — COMPREHENSIVE METABOLIC PANEL
ALT: 20 U/L (ref 0–53)
AST: 27 U/L (ref 0–37)
Albumin: 3.5 g/dL (ref 3.5–5.2)
Alkaline Phosphatase: 71 U/L (ref 39–117)
BUN: 53 mg/dL — ABNORMAL HIGH (ref 6–23)
CALCIUM: 9.3 mg/dL (ref 8.4–10.5)
CHLORIDE: 104 meq/L (ref 96–112)
CO2: 26 meq/L (ref 19–32)
Creatinine, Ser: 2.18 mg/dL — ABNORMAL HIGH (ref 0.40–1.50)
GFR: 40.07 mL/min — AB (ref >60.00)
Glucose, Bld: 168 mg/dL — ABNORMAL HIGH (ref 70–99)
POTASSIUM: 3.9 meq/L (ref 3.5–5.1)
Sodium: 137 mEq/L (ref 135–145)
Total Bilirubin: 0.5 mg/dL (ref 0.2–1.2)
Total Protein: 7.7 g/dL (ref 6.0–8.3)

## 2017-01-30 LAB — T4, FREE: Free T4: 0.9 ng/dL (ref 0.60–1.60)

## 2017-01-30 LAB — LIPID PANEL
CHOL/HDL RATIO: 4
Cholesterol: 114 mg/dL (ref 0–200)
HDL: 28.3 mg/dL — AB (ref >39.00)
LDL CALC: 68 mg/dL (ref 0–99)
NONHDL: 85.9
TRIGLYCERIDES: 90 mg/dL (ref 0.0–149.0)
VLDL: 18 mg/dL (ref 0.0–40.0)

## 2017-01-30 LAB — HEMOGLOBIN A1C: Hgb A1c MFr Bld: 6 % (ref 4.6–6.5)

## 2017-01-30 LAB — POCT GLYCOSYLATED HEMOGLOBIN (HGB A1C): HEMOGLOBIN A1C: 5.7

## 2017-01-30 LAB — TESTOSTERONE: Testosterone: 311.36 ng/dL (ref 300.00–890.00)

## 2017-01-30 NOTE — Patient Instructions (Signed)
Continue positive changes made:  Walking  Weight Lifting  Adequate Sleep  Lean protein  Addition of more non starchy vegetables  Addition of breakfast

## 2017-01-30 NOTE — Progress Notes (Signed)
Patient ID: Roberto [name], male   DOB: 1961/03/10, 57 y.o.   MRN: [id number]   Chief complaint: Follow-up of multiple endocrine problems  History of Present Illness:  PROBLEM 1: Pituitary tumor  His MRI on 07/18/14 showed a pituitary tumor with the following description: 2.3 x 2 x 1.8 cm mass centered in the sella with suprasellar extension and cavernous sinus extension greater on the left with an appearance most suggestive of pituitary macroadenoma. This impresses upon the optic chiasm  Had difficulty with peripheral vision on the left side but this has resolved He being followed by ophthalmologist periodically for this and macular edema  Has had a follow-up MRI in 2/17 showing focal area of delayed enhancement this represents a pituitary adenoma on the left.  This lesion measures 15 x 12 x 11 mm.  There is deviation of the pituitary stalk to the right. There is no cavernous sinus invasion. The lesion does not contact the optic chiasm  He is getting another MRI done soon as recommended by his ophthalmologist now  No recent headaches  PROBLEM 2:  Hyperprolactinemia:  At baseline he had a prolactin level checked and this was markedly increased at 1312 He had symptoms of decreased libido and gynecomastia Testosterone level was low at 70  He  was started on Dostinex 0.5 mg initially half tablet twice a week and then 1 tablet twice a week on his initial consultation on 08/04/14 He is tolerating the Dostinex well. Prolactin has been consistently normal since 09/2014  More recently his Dostinex has been reduced progressively Now taking half tablet twice a week Prolactin is pending, last level was 3.1   Lab Results  Component Value Date   PROLACTIN 8.1 01/30/2017   PROLACTIN 3.1 (L) 10/07/2016   PROLACTIN 5.0 06/06/2016   PROLACTIN 22.6 (H) 12/28/2015   PROLACTIN 5.9 10/16/2015   . HYPOGONADISM:   He has not been on any supplements for his hypogonadism which initially  had been severe Again he does not complain of fatigue and is able to exercise His testosterone level is improving progressively and last over 300   Lab Results  Component Value Date   TESTOSTERONE 311.36 01/30/2017    PROBLEM 3:  DIABETES type II with obesity    He was diagnosed  in 2014  and has been Previously managed by a primary care physician who he works with. On admission to the hospital in 2016 his glucose was 380 without any evidence of ketosis  Last A1c had gone up to 8%, previously 7.1; A1c in the office is now much improved at 5.7 bottom  NON-insulin hypoglycemic drugs currently used: Ozempic 0.5 mg weekly, metformin ER 2000 mg daily, glipizide ER 5 mg daily  Current management, blood sugar patterns and problems identified:   because of his relatively high A1c and abnormal renal function his Anastasio Auerbach was stopped in July OZEMPIC started  He has been able to take 0.5 mg weekly with this  This does appear to help him with motion and snacking control  Has lost 17 pounds since his last visit  No hypoglycemia with continuing glipizide  He again did not bring his monitor for download  He believes his blood sugars are mildly increased fasting but usually fairly good after meals when he checks them   By recall his blood sugars are as follows: 135-172 range fasting and 140-175 after meals  Weight history:  Wt Readings from Last 3 Encounters:  01/30/17 254 lb 12.8 oz (115.6  kg)  11/13/16 260 lb (117.9 kg)  10/10/16 271 lb 3.2 oz (123 kg)    Lab Results  Component Value Date   HGBA1C 5.7 01/30/2017   HGBA1C 6.0 01/30/2017   HGBA1C 8.0 (H) 10/07/2016   Lab Results  Component Value Date   MICROALBUR 90.6 (H) 10/10/2016   LDLCALC 68 01/30/2017   CREATININE 2.18 (H) 01/30/2017    OTHER active problems and review of systems    Past Medical History:  Diagnosis Date  . Anemia   . Diabetes mellitus without complication ([place])   . H/O hypogonadism   .  Hypertension   . Prolactinoma, benign [facility])     Past Surgical History:  Procedure Laterality Date  . [place]  . NO PAST SURGERIES      Family History  Problem Relation Age of Onset  . Diabetes Mother   . Colon polyps Mother   . Heart disease Mother   . Hypertension Mother   . Colon cancer Father   . Kidney disease Maternal Grandmother   . Colon cancer Maternal Grandfather   . Esophageal cancer Maternal Uncle   . Colon cancer Maternal Uncle     Social History:  reports that he has never smoked. He has never used smokeless tobacco. He reports that he does not drink alcohol or use drugs.  Allergies: No Known Allergies  Allergies as of 01/30/2017   No Known Allergies     Medication List       Accurate as of 01/30/17 11:59 PM. Always use your most recent med list.          amLODipine 10 MG tablet Commonly known as:  NORVASC Take 10 mg by mouth daily.   aspirin EC 81 MG tablet Take 81 mg by mouth daily.   cabergoline 0.5 MG tablet Commonly known as:  DOSTINEX TAKE 1 TABLET TWICE A WEEK   cholecalciferol 1000 units tablet Commonly known as:  VITAMIN D Take 2,000 Units by mouth daily.   cloNIDine 0.1 MG tablet Commonly known as:  CATAPRES Take 0.1 mg by mouth 2 (two) times daily.   glipiZIDE 5 MG 24 hr tablet Commonly known as:  GLUCOTROL XL TAKE 2 TABLETS EVERY DAY WITH BREAKFAST   isosorbide mononitrate 60 MG 24 hr tablet Commonly known as:  IMDUR Take 60 mg by mouth daily.   lisinopril 5 MG tablet Commonly known as:  PRINIVIL,ZESTRIL Take 5 mg by mouth daily.   metFORMIN 500 MG 24 hr tablet Commonly known as:  GLUCOPHAGE-XR TAKE 4 TABLETS EVERY DAY   metoprolol succinate 50 MG 24 hr tablet Commonly known as:  TOPROL-XL Take 50 mg by mouth daily. Take with or immediately following a meal.   Semaglutide 0.25 or 0.5 MG/DOSE Sopn Commonly known as:  OZEMPIC Inject 0.5 mg into the skin once a week. Start with 0.25mg  for 1st 2  shots       LABS:  Office Visit on 01/30/2017  Component Date Value Ref Range Status  . Hemoglobin A1C 01/30/2017 5.7   Final  . Hgb A1c MFr Bld 01/30/2017 6.0  4.6 - 6.5 % Final   Glycemic Control Guidelines for People with Diabetes:Non Diabetic:  <6%Goal of Therapy: <7%Additional Action Suggested:  >8%   . Sodium 01/30/2017 137  135 - 145 mEq/L Final  . Potassium 01/30/2017 3.9  3.5 - 5.1 mEq/L Final  . Chloride 01/30/2017 104  96 - 112 mEq/L Final  . CO2 01/30/2017 26  19 -  32 mEq/L Final  . Glucose, Bld 01/30/2017 168* 70 - 99 mg/dL Final  . BUN 01/30/2017 53* 6 - 23 mg/dL Final  . Creatinine, Ser 01/30/2017 2.18* 0.40 - 1.50 mg/dL Final  . Total Bilirubin 01/30/2017 0.5  0.2 - 1.2 mg/dL Final  . Alkaline Phosphatase 01/30/2017 71  39 - 117 U/L Final  . AST 01/30/2017 27  0 - 37 U/L Final  . ALT 01/30/2017 20  0 - 53 U/L Final  . Total Protein 01/30/2017 7.7  6.0 - 8.3 g/dL Final  . Albumin 01/30/2017 3.5  3.5 - 5.2 g/dL Final  . Calcium 01/30/2017 9.3  8.4 - 10.5 mg/dL Final  . GFR 01/30/2017 40.07* >60.00 mL/min Final  . Cholesterol 01/30/2017 114  0 - 200 mg/dL Final   ATP III Classification       Desirable:  < 200 mg/dL               Borderline High:  200 - 239 mg/dL          High:  > = 240 mg/dL  . Triglycerides 01/30/2017 90.0  0.0 - 149.0 mg/dL Final   Normal:  <150 mg/dLBorderline High:  150 - 199 mg/dL  . HDL 01/30/2017 28.30* >39.00 mg/dL Final  . VLDL 01/30/2017 18.0  0.0 - 40.0 mg/dL Final  . LDL Cholesterol 01/30/2017 68  0 - 99 mg/dL Final  . Total CHOL/HDL Ratio 01/30/2017 4   Final                  Men          Women1/2 Average Risk     3.4          3.3Average Risk          5.0          4.42X Average Risk          9.6          7.13X Average Risk          15.0          11.0                      . NonHDL 01/30/2017 85.90   Final   NOTE:  Non-HDL goal should be 30 mg/dL higher than patient's LDL goal (i.e. LDL goal of < 70 mg/dL, would have non-HDL goal of < 100  mg/dL)  . Free T4 01/30/2017 0.90  0.60 - 1.60 ng/dL Final   Comment: Specimens from patients who are undergoing biotin therapy and /or ingesting biotin supplements may contain high levels of biotin.  The higher biotin concentration in these specimens interferes with this Free T4 assay.  Specimens that contain high levels  of biotin may cause false high results for this Free T4 assay.  Please interpret results in light of the total clinical presentation of the patient.    . Testosterone 01/30/2017 311.36  300.00 - 890.00 ng/dL Final  . Prolactin 01/30/2017 8.1  4.0 - 15.2 ng/mL Final     REVIEW OF SYSTEMS:        Eyes: No known diabetic retinopathy, has had macular edema, followed regularly by ophthalmologist   Hypertension: has had a long history of high blood pressure  This has been previously difficult to control but now requiring less medication  Currently on regimen of 5 mg lisinopril, amlodipine 5 mg  Monitoring at home, he thinks his blood pressure has been about 130-149/80  He also tends to have edema and Norvasc was reduced previously to half tablet  Also recently his metoprolol was stopped, apparently he wanted to stop this to see if it would help his diabetic macular edema which appears to have improved reportedly  NEPHROPATHY: He has mild persistent increase in microalbumin needs follow-up  Creatinine had increased slightly when Invokana was increased up to 300 mg  Although subsequently it had come down it is back up to 1.7 now    Lab Results  Component Value Date   CREATININE 2.18 (H) 01/30/2017   CREATININE 1.72 (H) 10/07/2016   CREATININE 1.34 06/06/2016   CREATININE 1.58 (H) 01/21/2016      Diabetic foot exam in 3/17  showed no abnormality    VITAMIN D deficiency: This was done as a screening and it is extremely low at 5.8.  Has been on 5000 U supplements  Lab Results  Component Value Date   VD25OH 17.11 (L) 10/07/2016   VD25OH 5.89 (L) 12/28/2015       PHYSICAL EXAM:  BP (!) 142/78   Pulse 81   Ht 5' 11.26" (1.81 m)   Wt 254 lb 12.8 oz (115.6 kg)   SpO2 95%   BMI 35.28 kg/m      ASSESSMENT/PLAN:   DIABETES with mild obesity and previously persistently poor control.  See history of present illness for detailed discussion of current diabetes management, blood sugar patterns and problems identified  His A1c dramatically better at upper normal with using Ozempic and also his weight has come down 17 pounds  He also has been fairly good with his exercise regimen and is continuing to improve his diet also Again not clear what his home blood sugars are but he reports good readings except markedly increased fasting readings No hypoglycemia with current regimen of glipizide and can continue   Macroprolactinoma of the pituitary gland with very high baseline prolactin level of 1315 He has done  well with Dostinex  Prolactin level has been consistently normal and even with lower doses of cabergoline We will need to check his level today and decide on further treatment  MRI of pituitary gland and be reviewed when available, currently followed by ophthalmologist and reportedly has some visual field issues  HYPO GONADISM:    He does not appear to symptomatic, some of this is likely to be from insulin resistance syndrome Which is losing weight and keeping a prolactin under control he probably does not need any replacement therapy, does not desire this also Check level again today   HYPERTENSION: This is better controlled now, reportedly also fairly good at home Able to lose weight and thisalong with better diet is hoping is controlled He'll continue the same regimen   RENAL insufficiency: Creatinine to be rechecked today,previously possibly affected by using Invokana Does need to keep his nephrology consultation appointment that has been scheduled   ANEMIA: To follow-up with his PCP and gastroenterologist  Follow-up for office  visit in  3 months  Total visit time for evaluation and management of multiple problems and counseling = 25 minutes  Helyne Genther 01/31/2017, 9:59 PM   Note: This office note was prepared with Dragon voice recognition system technology. Any transcriptional errors that result from this process are unintentional.  Addendum: Creatinine is 2.2, needs to stop metformin, last chemistry faxed to nephrology

## 2017-01-30 NOTE — Progress Notes (Signed)
  Medical Nutrition Therapy:  Appt start time: 1030 end time:  1045.  Assessment:  Primary concerns today: Patient is here today alone for follow up of type 2 Diabetes and weight concerns.  His last visit was 12/04/16.  He has continued to walk 2 miles each day, lift weights, and sleeps 8-10 hours per night.  He has been eating breakfast, lunch and dinner, adding small amounts of carbohydrate and wants information regarding glycemic index and foods.  He states that the addition of non starchy vegetables have helped his fullness after a meal.  He states that he feels well and has increased energy. He states that his blood sugar in the am after breakfast is 160 and 140 after dinner.  Patient weighed on the Body Composition scale today which indicated a body fat loss of 12 lbs in the past  2 years.  His water weight is a little higher at this visit.  History includes a Vitamin D deficiency and he is now on supplementation of 2,000 units daily.  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  He travels between homes.    TANITA  BODY COMP RESULTS 01/30/17 256 lbs   BMI (kg/m^2) 34.7   Fat Mass (lbs) 40.2   Fat Free Mass (lbs) 215.8   Total Body Water (lbs) 164    MEDICATIONS: see list including multiple diabetes medications.  DIETARY INTAKE:  24-hr recall:  B ( AM): egg and oatmeal  Snk ( AM) :none  L ( PM): chicken or fish and 3 non starchy vegetables  Snk ( PM): none D ( PM): chicken or fish and 3 non starchy vegetables  Snk ( PM): none Beverages: water  Recent physical activity: 2 miles walking daily and has added weight lifting.   Progress Towards Goal(s):  In progress.   Nutritional Diagnosis:  NB-1.1 Food and nutrition-related knowledge deficit As related to balance of carbohydrate, protein, and fat.  As evidenced by diet hx.    Intervention:  Nutrition counseling/education continued.  Encouraged patient  continued positive changes with improving diet variety and balance as well as exercise options.  Discussed low glycemic index foods.  Discussed FreeStyle Libre option for blood sugar monitoring.  Continue positive changes made:  Walking  Weight Lifting  Adequate Sleep  Lean protein  Addition of more non starchy vegetables  Addition of breakfast   Handouts given during visit include:  Glycemic index of foods modified from ADA  Monitoring/Evaluation:  Dietary intake, exercise, and body weight in 3 month(s).

## 2017-01-31 LAB — PROLACTIN: Prolactin: 8.1 ng/mL (ref 4.0–15.2)

## 2017-02-05 ENCOUNTER — Telehealth: Payer: Self-pay | Admitting: Endocrinology

## 2017-02-05 NOTE — Telephone Encounter (Signed)
Patient is calling you back.pb

## 2017-02-06 ENCOUNTER — Other Ambulatory Visit: Payer: Self-pay | Admitting: Nephrology

## 2017-02-06 DIAGNOSIS — E119 Type 2 diabetes mellitus without complications: Secondary | ICD-10-CM | POA: Diagnosis not present

## 2017-02-06 DIAGNOSIS — N183 Chronic kidney disease, stage 3 unspecified: Secondary | ICD-10-CM

## 2017-02-06 DIAGNOSIS — I10 Essential (primary) hypertension: Secondary | ICD-10-CM | POA: Diagnosis not present

## 2017-02-06 DIAGNOSIS — R300 Dysuria: Secondary | ICD-10-CM | POA: Diagnosis not present

## 2017-02-06 NOTE — Telephone Encounter (Signed)
Spoke with the patient and gave him his lab results- I routed result note back to Dr. Dwyane Dee with the patients questions

## 2017-02-16 ENCOUNTER — Other Ambulatory Visit: Payer: BLUE CROSS/BLUE SHIELD

## 2017-02-16 DIAGNOSIS — R809 Proteinuria, unspecified: Secondary | ICD-10-CM | POA: Diagnosis not present

## 2017-02-16 DIAGNOSIS — N183 Chronic kidney disease, stage 3 (moderate): Secondary | ICD-10-CM | POA: Diagnosis not present

## 2017-02-23 ENCOUNTER — Other Ambulatory Visit (HOSPITAL_COMMUNITY): Payer: Self-pay | Admitting: Nephrology

## 2017-02-23 DIAGNOSIS — R809 Proteinuria, unspecified: Secondary | ICD-10-CM

## 2017-02-25 DIAGNOSIS — H40013 Open angle with borderline findings, low risk, bilateral: Secondary | ICD-10-CM | POA: Diagnosis not present

## 2017-02-26 DIAGNOSIS — E113311 Type 2 diabetes mellitus with moderate nonproliferative diabetic retinopathy with macular edema, right eye: Secondary | ICD-10-CM | POA: Diagnosis not present

## 2017-02-26 DIAGNOSIS — H43821 Vitreomacular adhesion, right eye: Secondary | ICD-10-CM | POA: Diagnosis not present

## 2017-02-26 DIAGNOSIS — E113313 Type 2 diabetes mellitus with moderate nonproliferative diabetic retinopathy with macular edema, bilateral: Secondary | ICD-10-CM | POA: Diagnosis not present

## 2017-03-03 ENCOUNTER — Ambulatory Visit
Admission: RE | Admit: 2017-03-03 | Discharge: 2017-03-03 | Disposition: A | Discharge status: Home / Self Care | Payer: BLUE CROSS/BLUE SHIELD | Source: Ambulatory Visit | Attending: Nephrology | Admitting: Nephrology

## 2017-03-03 DIAGNOSIS — N183 Chronic kidney disease, stage 3 unspecified: Secondary | ICD-10-CM

## 2017-03-03 DIAGNOSIS — N189 Chronic kidney disease, unspecified: Secondary | ICD-10-CM | POA: Diagnosis not present

## 2017-03-11 ENCOUNTER — Other Ambulatory Visit: Payer: Self-pay | Admitting: Radiology

## 2017-03-12 ENCOUNTER — Other Ambulatory Visit: Payer: Self-pay | Admitting: Endocrinology

## 2017-03-16 ENCOUNTER — Ambulatory Visit (HOSPITAL_COMMUNITY)
Admission: RE | Admit: 2017-03-16 | Discharge: 2017-03-16 | Disposition: A | Discharge status: Home / Self Care | Payer: BLUE CROSS/BLUE SHIELD | Source: Ambulatory Visit | Attending: Nephrology | Admitting: Nephrology

## 2017-03-16 ENCOUNTER — Other Ambulatory Visit: Payer: Self-pay | Admitting: Endocrinology

## 2017-03-16 ENCOUNTER — Encounter: Payer: Self-pay | Admitting: Endocrinology

## 2017-03-16 DIAGNOSIS — N183 Chronic kidney disease, stage 3 (moderate): Secondary | ICD-10-CM | POA: Diagnosis not present

## 2017-03-16 DIAGNOSIS — I150 Renovascular hypertension: Secondary | ICD-10-CM | POA: Diagnosis not present

## 2017-03-16 DIAGNOSIS — E1122 Type 2 diabetes mellitus with diabetic chronic kidney disease: Secondary | ICD-10-CM | POA: Diagnosis not present

## 2017-03-16 DIAGNOSIS — R809 Proteinuria, unspecified: Secondary | ICD-10-CM | POA: Diagnosis not present

## 2017-03-16 DIAGNOSIS — I129 Hypertensive chronic kidney disease with stage 1 through stage 4 chronic kidney disease, or unspecified chronic kidney disease: Secondary | ICD-10-CM | POA: Diagnosis not present

## 2017-03-23 ENCOUNTER — Other Ambulatory Visit: Payer: Self-pay | Admitting: Radiology

## 2017-03-25 ENCOUNTER — Encounter (HOSPITAL_COMMUNITY): Payer: Self-pay

## 2017-03-25 ENCOUNTER — Ambulatory Visit (HOSPITAL_COMMUNITY)
Admission: RE | Admit: 2017-03-25 | Discharge: 2017-03-25 | Disposition: A | Discharge status: Home / Self Care | Payer: BLUE CROSS/BLUE SHIELD | Source: Ambulatory Visit | Attending: Nephrology | Admitting: Nephrology

## 2017-03-25 DIAGNOSIS — Z8371 Family history of colonic polyps: Secondary | ICD-10-CM | POA: Diagnosis not present

## 2017-03-25 DIAGNOSIS — R809 Proteinuria, unspecified: Secondary | ICD-10-CM | POA: Diagnosis not present

## 2017-03-25 DIAGNOSIS — Z833 Family history of diabetes mellitus: Secondary | ICD-10-CM | POA: Diagnosis not present

## 2017-03-25 DIAGNOSIS — Z8249 Family history of ischemic heart disease and other diseases of the circulatory system: Secondary | ICD-10-CM | POA: Diagnosis not present

## 2017-03-25 DIAGNOSIS — D352 Benign neoplasm of pituitary gland: Secondary | ICD-10-CM | POA: Diagnosis not present

## 2017-03-25 DIAGNOSIS — Z79899 Other long term (current) drug therapy: Secondary | ICD-10-CM | POA: Insufficient documentation

## 2017-03-25 DIAGNOSIS — I129 Hypertensive chronic kidney disease with stage 1 through stage 4 chronic kidney disease, or unspecified chronic kidney disease: Secondary | ICD-10-CM | POA: Insufficient documentation

## 2017-03-25 DIAGNOSIS — N189 Chronic kidney disease, unspecified: Secondary | ICD-10-CM | POA: Diagnosis not present

## 2017-03-25 DIAGNOSIS — Z7982 Long term (current) use of aspirin: Secondary | ICD-10-CM | POA: Insufficient documentation

## 2017-03-25 DIAGNOSIS — Z800 Family history of malignant neoplasm of digestive organs: Secondary | ICD-10-CM | POA: Diagnosis not present

## 2017-03-25 DIAGNOSIS — Z7984 Long term (current) use of oral hypoglycemic drugs: Secondary | ICD-10-CM | POA: Diagnosis not present

## 2017-03-25 DIAGNOSIS — E1122 Type 2 diabetes mellitus with diabetic chronic kidney disease: Secondary | ICD-10-CM | POA: Insufficient documentation

## 2017-03-25 LAB — CBC
HCT: 34.8 % — ABNORMAL LOW (ref 39.0–52.0)
HEMOGLOBIN: 11.2 g/dL — AB (ref 13.0–17.0)
MCH: 28.9 pg (ref 26.0–34.0)
MCHC: 32.2 g/dL (ref 30.0–36.0)
MCV: 89.7 fL (ref 78.0–100.0)
Platelets: 244 10*3/uL (ref 150–400)
RBC: 3.88 MIL/uL — ABNORMAL LOW (ref 4.22–5.81)
RDW: 12.7 % (ref 11.5–15.5)
WBC: 6.4 10*3/uL (ref 4.0–10.5)

## 2017-03-25 LAB — PROTIME-INR
INR: 1.06
PROTHROMBIN TIME: 13.7 s (ref 11.4–15.2)

## 2017-03-25 LAB — GLUCOSE, CAPILLARY: Glucose-Capillary: 165 mg/dL — ABNORMAL HIGH (ref 65–99)

## 2017-03-25 LAB — APTT: aPTT: 31 seconds (ref 24–36)

## 2017-03-25 MED ORDER — SODIUM CHLORIDE 0.9 % IV SOLN
INTRAVENOUS | Status: AC | PRN
  Administered 2017-03-25: 10 mL/h via INTRAVENOUS

## 2017-03-25 MED ORDER — SODIUM CHLORIDE 0.9 % IV SOLN: INTRAVENOUS | Status: DC

## 2017-03-25 MED ORDER — MIDAZOLAM HCL 2 MG/2ML IJ SOLN
INTRAMUSCULAR | Status: AC
  Filled 2017-03-25: qty 4

## 2017-03-25 MED ORDER — HYDROCODONE-ACETAMINOPHEN 5-325 MG PO TABS: 1.0000 | ORAL_TABLET | ORAL | Status: DC | PRN

## 2017-03-25 MED ORDER — GELATIN ABSORBABLE 12-7 MM EX MISC
CUTANEOUS | Status: AC
  Filled 2017-03-25: qty 1

## 2017-03-25 MED ORDER — LIDOCAINE HCL (PF) 1 % IJ SOLN
INTRAMUSCULAR | Status: AC
  Filled 2017-03-25: qty 30

## 2017-03-25 MED ORDER — MIDAZOLAM HCL 2 MG/2ML IJ SOLN
INTRAMUSCULAR | Status: AC | PRN
  Administered 2017-03-25 (×4): 1 mg via INTRAVENOUS

## 2017-03-25 MED ORDER — HYDRALAZINE HCL 20 MG/ML IJ SOLN
INTRAMUSCULAR | Status: AC
  Filled 2017-03-25: qty 1

## 2017-03-25 NOTE — H&P (Signed)
Chief Complaint: Patient was seen in consultation today for random renal biopsy at the request of [name],[name]  Referring Physician(s): Chelsea B  Supervising Physician: Arne Cleveland  Patient Status: [facility] - Out-pt  History of Present Illness: Roberto [name] is a 56 y.o. male   Known HTN New meds few months ago Development of change in Creatinine Referred to Dr Alfonso Ellis Work up reveals nephrotic range proteinuria Now scheduled for random renal biopsy   Past Medical History:  Diagnosis Date  . Anemia   . Diabetes mellitus without complication ([place])   . H/O hypogonadism   . Hypertension   . Prolactinoma, benign [facility])     Past Surgical History:  Procedure Laterality Date  . [place]  . NO PAST SURGERIES      Allergies: Patient has no known allergies.  Medications: Prior to Admission medications   Medication Sig Start Date End Date Taking? Authorizing Provider  amLODipine (NORVASC) 10 MG tablet Take 10 mg by mouth daily.   Yes [provider]  aspirin EC 81 MG tablet Take 81 mg by mouth daily.   Yes [provider]  cabergoline (DOSTINEX) 0.5 MG tablet TAKE 1 TABLET TWICE A WEEK 11/30/16  Yes Elayne Snare, MD  cholecalciferol (VITAMIN D) 1000 units tablet Take 2,000 Units by mouth daily.   Yes [provider]  cloNIDine (CATAPRES) 0.1 MG tablet Take 0.1 mg by mouth 2 (two) times daily.   Yes [provider]  glipiZIDE (GLUCOTROL XL) 5 MG 24 hr tablet TAKE 2 TABLETS EVERY DAY WITH BREAKFAST 03/12/17  Yes Elayne Snare, MD  isosorbide mononitrate (IMDUR) 60 MG 24 hr tablet Take 60 mg by mouth daily.   Yes [provider]  lisinopril (PRINIVIL,ZESTRIL) 5 MG tablet Take 5 mg by mouth daily.   Yes [provider]  metoprolol succinate (TOPROL-XL) 50 MG 24 hr tablet Take 50 mg by mouth daily. Take with or immediately following a meal.   Yes [provider]  OZEMPIC 0.25 or 0.5 MG/DOSE  SOPN INJECT 0.5 MG INTO THE SKIN ONCE A WEEK. START WITH 0.25MG  FOR 1ST 2 SHOTS 03/16/17  Yes Elayne Snare, MD     Family History  Problem Relation Age of Onset  . Diabetes Mother   . Colon polyps Mother   . Heart disease Mother   . Hypertension Mother   . Colon cancer Father   . Kidney disease Maternal Grandmother   . Colon cancer Maternal Grandfather   . Esophageal cancer Maternal Uncle   . Colon cancer Maternal Uncle     Social History   Socioeconomic History  . Marital status: Married    Spouse name: None  . Number of children: 0  . Years of education: None  . Highest education level: None  Social Needs  . Financial resource strain: None  . Food insecurity - worry: None  . Food insecurity - inability: None  . Transportation needs - medical: None  . Transportation needs - non-medical: None  Occupational History  . Occupation: dostor  Tobacco Use  . Smoking status: Never Smoker  . Smokeless tobacco: Never Used  Substance and Sexual Activity  . Alcohol use: No  . Drug use: No  . Sexual activity: Yes  Other Topics Concern  . None  Social History Narrative  . None    Review of Systems: A 12 point ROS discussed and pertinent positives are indicated in the HPI above.  All other systems are  negative.  Review of Systems  Constitutional: Negative for activity change, fatigue and fever.  Respiratory: Negative for cough and shortness of breath.   Gastrointestinal: Negative for abdominal pain.  Neurological: Negative for weakness.  Psychiatric/Behavioral: Negative for behavioral problems and confusion.    Vital Signs: BP (!) 195/99   Pulse 87   Temp 98.1 F (36.7 C) (Oral)   Resp 16   Ht 6' (1.829 m)   Wt 250 lb (113.4 kg)   SpO2 100%   BMI 33.91 kg/m   Physical Exam  Constitutional: He is oriented to person, place, and time. He appears well-nourished.  Cardiovascular: Normal rate, regular rhythm and normal heart sounds.  No murmur heard. Pulmonary/Chest:  Effort normal and breath sounds normal. He has no wheezes.  Abdominal: Soft. Bowel sounds are normal. There is no tenderness.  Musculoskeletal: Normal range of motion.  Neurological: He is alert and oriented to person, place, and time.  Skin: Skin is warm and dry.  Psychiatric: He has a normal mood and affect. His behavior is normal. Judgment and thought content normal.  Nursing note and vitals reviewed.   Imaging: US Renal  Result Date: 03/03/2017 CLINICAL DATA:  56 year old diabetic hypertensive male with chronic kidney disease. Initial encounter. EXAM: RENAL / URINARY TRACT ULTRASOUND COMPLETE COMPARISON:  None. FINDINGS: Right Kidney: Length: 13.3 cm. Echogenicity within normal limits. No mass or hydronephrosis visualized. Left Kidney: Length: 12.2 cm. Echogenicity within normal limits. No mass or hydronephrosis visualized. Bladder: Appears normal for degree of bladder distention. Slightly prominent prostate gland measuring 5.1 x 3.8 x 5 cm. Prostate gland not adequately assessed by the present ultrasound. IMPRESSION: Negative renal sonogram. Slightly prominent prostate gland. Electronically Signed   By: Genia Del M.D.   On: 03/03/2017 15:32    Labs:  CBC: Recent Labs    06/06/16 1526 10/07/16 1119  WBC 4.7 4.8  HGB 9.7* 11.6*  HCT 29.6* 34.5*  PLT 221.0 207.0    COAGS: No results for input(s): INR, APTT in the last 8760 hours.  BMP: Recent Labs    06/06/16 1526 10/07/16 1119 01/30/17 1216  NA 138 138 137  K 3.7 3.8 3.9  CL 104 105 104  CO2 30 25 26   GLUCOSE 166* 177* 168*  BUN 23 32* 53*  CALCIUM 9.1 9.2 9.3  CREATININE 1.34 1.72* 2.18*    LIVER FUNCTION TESTS: Recent Labs    06/06/16 1526 10/07/16 1119 01/30/17 1216  BILITOT 0.3 0.4 0.5  AST 16 19 27   ALT 13 15 20   ALKPHOS 77 73 71  PROT 7.3 7.5 7.7  ALBUMIN 3.2* 3.8 3.5    TUMOR MARKERS: No results for input(s): AFPTM, CEA, CA199, CHROMGRNA in the last 8760 hours.  Assessment and  Plan:  HTN Nephrotic range proteinuria Scheduled for random renal biopsy Risks and benefits discussed with the patient including, but not limited to bleeding, infection, damage to adjacent structures or low yield requiring additional tests. All of the patient's questions were answered, patient is agreeable to proceed. Consent signed and in chart.   Thank you for this interesting consult.  I greatly enjoyed meeting Akeem Heppler and look forward to participating in their care.  A copy of this report was sent to the requesting provider on this date.  Electronically Signed: Lavonia Drafts, PA-C 03/25/2017, 7:42 AM   I spent a total of  30 Minutes   in face to face in clinical consultation, greater than 50% of which was counseling/coordinating care for random renal  bx

## 2017-03-25 NOTE — Procedures (Signed)
  Procedure:  [employer] [facility] random renal biopsy 16g x2 Preprocedure diagnosis: Proteinuria Postprocedure diagnosis: same EBL:   minimal Complications:  none immediate  See full dictation in [facility].  Dillard Cannon MD Main # [phone] Pager  [phone]

## 2017-03-25 NOTE — Discharge Instructions (Addendum)

## 2017-03-26 DIAGNOSIS — H40003 Preglaucoma, unspecified, bilateral: Secondary | ICD-10-CM | POA: Diagnosis not present

## 2017-03-28 DIAGNOSIS — H53139 Sudden visual loss, unspecified eye: Secondary | ICD-10-CM | POA: Diagnosis not present

## 2017-03-28 DIAGNOSIS — Z9889 Other specified postprocedural states: Secondary | ICD-10-CM | POA: Diagnosis not present

## 2017-03-28 DIAGNOSIS — D352 Benign neoplasm of pituitary gland: Secondary | ICD-10-CM | POA: Diagnosis not present

## 2017-04-06 ENCOUNTER — Encounter (HOSPITAL_COMMUNITY): Payer: Self-pay

## 2017-04-09 DIAGNOSIS — E113312 Type 2 diabetes mellitus with moderate nonproliferative diabetic retinopathy with macular edema, left eye: Secondary | ICD-10-CM | POA: Diagnosis not present

## 2017-04-09 DIAGNOSIS — H35033 Hypertensive retinopathy, bilateral: Secondary | ICD-10-CM | POA: Diagnosis not present

## 2017-04-09 DIAGNOSIS — E113311 Type 2 diabetes mellitus with moderate nonproliferative diabetic retinopathy with macular edema, right eye: Secondary | ICD-10-CM | POA: Diagnosis not present

## 2017-04-13 DIAGNOSIS — N183 Chronic kidney disease, stage 3 (moderate): Secondary | ICD-10-CM | POA: Diagnosis not present

## 2017-04-13 DIAGNOSIS — I129 Hypertensive chronic kidney disease with stage 1 through stage 4 chronic kidney disease, or unspecified chronic kidney disease: Secondary | ICD-10-CM | POA: Diagnosis not present

## 2017-04-13 DIAGNOSIS — E1122 Type 2 diabetes mellitus with diabetic chronic kidney disease: Secondary | ICD-10-CM | POA: Diagnosis not present

## 2017-04-30 NOTE — Progress Notes (Signed)
This encounter was created in error - please disregard.

## 2017-05-01 ENCOUNTER — Encounter: Payer: BLUE CROSS/BLUE SHIELD | Admitting: Endocrinology

## 2017-05-01 DIAGNOSIS — Z0289 Encounter for other administrative examinations: Secondary | ICD-10-CM

## 2017-05-04 ENCOUNTER — Ambulatory Visit: Payer: BLUE CROSS/BLUE SHIELD | Admitting: Dietician

## 2017-05-05 ENCOUNTER — Other Ambulatory Visit (INDEPENDENT_AMBULATORY_CARE_PROVIDER_SITE_OTHER): Payer: BLUE CROSS/BLUE SHIELD

## 2017-05-05 DIAGNOSIS — E559 Vitamin D deficiency, unspecified: Secondary | ICD-10-CM | POA: Diagnosis not present

## 2017-05-05 DIAGNOSIS — D352 Benign neoplasm of pituitary gland: Secondary | ICD-10-CM | POA: Diagnosis not present

## 2017-05-05 LAB — COMPREHENSIVE METABOLIC PANEL
ALK PHOS: 71 U/L (ref 39–117)
ALT: 19 U/L (ref 0–53)
AST: 22 U/L (ref 0–37)
Albumin: 3.5 g/dL (ref 3.5–5.2)
BILIRUBIN TOTAL: 0.5 mg/dL (ref 0.2–1.2)
BUN: 37 mg/dL — ABNORMAL HIGH (ref 6–23)
CALCIUM: 9.3 mg/dL (ref 8.4–10.5)
CO2: 31 meq/L (ref 19–32)
CREATININE: 2.02 mg/dL — AB (ref 0.40–1.50)
Chloride: 103 mEq/L (ref 96–112)
GFR: 43.71 mL/min — ABNORMAL LOW (ref >60.00)
Glucose, Bld: 189 mg/dL — ABNORMAL HIGH (ref 70–99)
Potassium: 3.6 mEq/L (ref 3.5–5.1)
Sodium: 140 mEq/L (ref 135–145)
TOTAL PROTEIN: 7.3 g/dL (ref 6.0–8.3)

## 2017-05-05 LAB — VITAMIN D 25 HYDROXY (VIT D DEFICIENCY, FRACTURES): VITD: 18.3 ng/mL — ABNORMAL LOW (ref 30.00–100.00)

## 2017-05-05 LAB — TESTOSTERONE: Testosterone: 332.39 ng/dL (ref 300.00–890.00)

## 2017-05-06 LAB — PROLACTIN: Prolactin: 6.7 ng/mL (ref 4.0–15.2)

## 2017-05-07 DIAGNOSIS — D352 Benign neoplasm of pituitary gland: Secondary | ICD-10-CM | POA: Diagnosis not present

## 2017-05-07 DIAGNOSIS — E113313 Type 2 diabetes mellitus with moderate nonproliferative diabetic retinopathy with macular edema, bilateral: Secondary | ICD-10-CM | POA: Diagnosis not present

## 2017-05-07 DIAGNOSIS — H33001 Unspecified retinal detachment with retinal break, right eye: Secondary | ICD-10-CM | POA: Diagnosis not present

## 2017-05-07 DIAGNOSIS — H35033 Hypertensive retinopathy, bilateral: Secondary | ICD-10-CM | POA: Diagnosis not present

## 2017-05-11 ENCOUNTER — Telehealth: Payer: Self-pay | Admitting: Endocrinology

## 2017-05-11 DIAGNOSIS — H469 Unspecified optic neuritis: Secondary | ICD-10-CM | POA: Diagnosis not present

## 2017-05-11 DIAGNOSIS — E1122 Type 2 diabetes mellitus with diabetic chronic kidney disease: Secondary | ICD-10-CM | POA: Diagnosis not present

## 2017-05-11 DIAGNOSIS — I129 Hypertensive chronic kidney disease with stage 1 through stage 4 chronic kidney disease, or unspecified chronic kidney disease: Secondary | ICD-10-CM | POA: Diagnosis not present

## 2017-05-11 DIAGNOSIS — D649 Anemia, unspecified: Secondary | ICD-10-CM | POA: Diagnosis not present

## 2017-05-11 DIAGNOSIS — E291 Testicular hypofunction: Secondary | ICD-10-CM | POA: Diagnosis not present

## 2017-05-11 DIAGNOSIS — H33001 Unspecified retinal detachment with retinal break, right eye: Secondary | ICD-10-CM | POA: Diagnosis not present

## 2017-05-11 DIAGNOSIS — H33021 Retinal detachment with multiple breaks, right eye: Secondary | ICD-10-CM | POA: Diagnosis not present

## 2017-05-11 DIAGNOSIS — N189 Chronic kidney disease, unspecified: Secondary | ICD-10-CM | POA: Diagnosis not present

## 2017-05-11 DIAGNOSIS — Z7984 Long term (current) use of oral hypoglycemic drugs: Secondary | ICD-10-CM | POA: Diagnosis not present

## 2017-05-11 DIAGNOSIS — D352 Benign neoplasm of pituitary gland: Secondary | ICD-10-CM | POA: Diagnosis not present

## 2017-05-11 DIAGNOSIS — H3341 Traction detachment of retina, right eye: Secondary | ICD-10-CM | POA: Diagnosis not present

## 2017-05-11 DIAGNOSIS — E113391 Type 2 diabetes mellitus with moderate nonproliferative diabetic retinopathy without macular edema, right eye: Secondary | ICD-10-CM | POA: Diagnosis not present

## 2017-05-11 DIAGNOSIS — E113541 Type 2 diabetes mellitus with proliferative diabetic retinopathy with combined traction retinal detachment and rhegmatogenous retinal detachment, right eye: Secondary | ICD-10-CM | POA: Diagnosis not present

## 2017-05-11 DIAGNOSIS — H33011 Retinal detachment with single break, right eye: Secondary | ICD-10-CM | POA: Diagnosis not present

## 2017-05-11 DIAGNOSIS — K219 Gastro-esophageal reflux disease without esophagitis: Secondary | ICD-10-CM | POA: Diagnosis not present

## 2017-05-12 ENCOUNTER — Ambulatory Visit: Payer: BLUE CROSS/BLUE SHIELD | Admitting: Endocrinology

## 2017-05-18 DIAGNOSIS — H4051X4 Glaucoma secondary to other eye disorders, right eye, indeterminate stage: Secondary | ICD-10-CM | POA: Diagnosis not present

## 2017-05-21 LAB — HM DIABETES EYE EXAM

## 2017-05-25 DIAGNOSIS — N183 Chronic kidney disease, stage 3 (moderate): Secondary | ICD-10-CM | POA: Diagnosis not present

## 2017-05-25 DIAGNOSIS — E1122 Type 2 diabetes mellitus with diabetic chronic kidney disease: Secondary | ICD-10-CM | POA: Diagnosis not present

## 2017-05-25 DIAGNOSIS — I129 Hypertensive chronic kidney disease with stage 1 through stage 4 chronic kidney disease, or unspecified chronic kidney disease: Secondary | ICD-10-CM | POA: Diagnosis not present

## 2017-05-26 ENCOUNTER — Encounter: Payer: Self-pay | Admitting: Endocrinology

## 2017-05-26 ENCOUNTER — Ambulatory Visit: Payer: BLUE CROSS/BLUE SHIELD | Admitting: Endocrinology

## 2017-05-26 VITALS — BP 158/88 | HR 78 | Ht 72.0 in | Wt 243.2 lb

## 2017-05-26 DIAGNOSIS — E559 Vitamin D deficiency, unspecified: Secondary | ICD-10-CM

## 2017-05-26 DIAGNOSIS — D352 Benign neoplasm of pituitary gland: Secondary | ICD-10-CM

## 2017-05-26 DIAGNOSIS — E1165 Type 2 diabetes mellitus with hyperglycemia: Secondary | ICD-10-CM | POA: Diagnosis not present

## 2017-05-26 LAB — POCT GLYCOSYLATED HEMOGLOBIN (HGB A1C): Hemoglobin A1C: 6.5

## 2017-05-26 NOTE — Progress Notes (Signed)
Patient ID: Roberto [name], male   DOB: April 16, 1960, 57 y.o.   MRN: [id number]   Chief complaint: Follow-up of multiple endocrine problems  History of Present Illness:  PROBLEM 1: Pituitary tumor  His MRI on 07/18/14 showed a pituitary tumor with the following description: 2.3 x 2 x 1.8 cm mass centered in the sella with suprasellar extension and cavernous sinus extension greater on the left with an appearance most suggestive of pituitary macroadenoma. This impresses upon the optic chiasm  Had difficulty with peripheral vision on the left side but this has resolved He being followed by ophthalmologist periodically for this and macular edema  Has had a follow-up MRI in 2/17 showing focal area of delayed enhancement this represents a pituitary adenoma on the left.  This lesion measures 15 x 12 x 11 mm.  There is deviation of the pituitary stalk to the right. There is no cavernous sinus invasion. The lesion does not contact the optic chiasm  No recent headaches  PROBLEM 2:  Hyperprolactinemia:  At baseline he had a prolactin level checked and this was markedly increased at 1312 He had symptoms of decreased libido and gynecomastia Testosterone level was low at 70  He  was started on Dostinex 0.5 mg initially half tablet twice a week and then 1 tablet twice a week on his initial consultation on 08/04/14 He is tolerating the Dostinex well. Prolactin has been consistently normal since 09/2014  Now taking half tablet of Dostinex twice a week, the dose was reduced in 09/2016 Prolactin had been consistently normal in the lower end of the range   Lab Results  Component Value Date   PROLACTIN 6.7 05/05/2017   PROLACTIN 8.1 01/30/2017   PROLACTIN 3.1 (L) 10/07/2016   PROLACTIN 5.0 06/06/2016   PROLACTIN 22.6 (H) 12/28/2015   . HYPOGONADISM:   He has not been on any supplements for his hypogonadism  Although testosterone was significantly low after his tumor was diagnosed with  has improved and now consistently over 300 Again he does not complain of fatigue or decreased motivation   Lab Results  Component Value Date   TESTOSTERONE 332.39 05/05/2017    PROBLEM 3:  DIABETES type II with obesity    He was diagnosed  in 2014  and has been Previously managed by a primary care physician who he works with. On admission to the hospital in 2016 his glucose was 380 without any evidence of ketosis  Highest A1c had been 8%,  Although it had been down to 5.7 possibly from his renal dysfunction it is now 6.5  He does not bring his monitor for download as requested  NON-insulin hypoglycemic drugs currently used: Ozempic 0.5 mg weekly, , glipizide ER 5 mg daily  Current management, blood sugar patterns and problems identified:  He has continued to lose weight with using Ozempic as well as following the diet given by the dietitian  Most of the time he is able to do some exercise although not as much because of his recent eye surgery  It does appear that his sugars are relatively high fasting although according to history and was 189 after breakfast in the lab  He does not think his blood sugars are high after meals at night usually  By recall his blood sugars are as follows: 130-140 range fasting and 160-175 after meals  Weight history:  Wt Readings from Last 3 Encounters:  05/26/17 243 lb 3.2 oz (110.3 kg)  03/25/17 250 lb (113.4 kg)  01/30/17 254 lb 12.8 oz (115.6 kg)    Lab Results  Component Value Date   HGBA1C 5.7 01/30/2017   HGBA1C 6.0 01/30/2017   HGBA1C 8.0 (H) 10/07/2016   Lab Results  Component Value Date   MICROALBUR 90.6 (H) 10/10/2016   LDLCALC 68 01/30/2017   CREATININE 2.02 (H) 05/05/2017    OTHER active problems and review of systems    Past Medical History:  Diagnosis Date  . Anemia   . Diabetes mellitus without complication ([place])   . H/O hypogonadism   . Hypertension   . Prolactinoma, benign [facility])     Past Surgical  History:  Procedure Laterality Date  . [place]  . NO PAST SURGERIES      Family History  Problem Relation Age of Onset  . Diabetes Mother   . Colon polyps Mother   . Heart disease Mother   . Hypertension Mother   . Colon cancer Father   . Kidney disease Maternal Grandmother   . Colon cancer Maternal Grandfather   . Esophageal cancer Maternal Uncle   . Colon cancer Maternal Uncle     Social History:  reports that  has never smoked. he has never used smokeless tobacco. He reports that he does not drink alcohol or use drugs.  Allergies: No Known Allergies  Allergies as of 05/26/2017   No Known Allergies     Medication List        Accurate as of 05/26/17 11:03 AM. Always use your most recent med list.          amLODipine 10 MG tablet Commonly known as:  NORVASC Take 10 mg by mouth daily.   aspirin EC 81 MG tablet Take 81 mg by mouth daily.   cabergoline 0.5 MG tablet Commonly known as:  DOSTINEX TAKE 1 TABLET TWICE A WEEK   cholecalciferol 1000 units tablet Commonly known as:  VITAMIN D Take 2,000 Units by mouth daily.   cloNIDine 0.1 MG tablet Commonly known as:  CATAPRES Take 0.1 mg by mouth 2 (two) times daily.   glipiZIDE 5 MG 24 hr tablet Commonly known as:  GLUCOTROL XL TAKE 2 TABLETS EVERY DAY WITH BREAKFAST   isosorbide mononitrate 60 MG 24 hr tablet Commonly known as:  IMDUR Take 60 mg by mouth daily.   lisinopril 5 MG tablet Commonly known as:  PRINIVIL,ZESTRIL Take 5 mg by mouth daily.   metoprolol succinate 50 MG 24 hr tablet Commonly known as:  TOPROL-XL Take 50 mg by mouth daily. Take with or immediately following a meal.   OZEMPIC 0.25 or 0.5 MG/DOSE Sopn Generic drug:  Semaglutide INJECT 0.5 MG INTO THE SKIN ONCE A WEEK. START WITH 0.25MG  FOR 1ST 2 SHOTS       LABS:  No visits with results within 1 Week(s) from this visit.  Latest known visit with results is:  Lab on 05/05/2017  Component Date Value Ref  Range Status  . Testosterone 05/05/2017 332.39  300.00 - 890.00 ng/dL Final  . Prolactin 05/05/2017 6.7  4.0 - 15.2 ng/mL Final  . Sodium 05/05/2017 140  135 - 145 mEq/L Final  . Potassium 05/05/2017 3.6  3.5 - 5.1 mEq/L Final  . Chloride 05/05/2017 103  96 - 112 mEq/L Final  . CO2 05/05/2017 31  19 - 32 mEq/L Final  . Glucose, Bld 05/05/2017 189* 70 - 99 mg/dL Final  . BUN 05/05/2017 37* 6 - 23 mg/dL Final  . Creatinine, Ser 05/05/2017 2.02*  0.40 - 1.50 mg/dL Final  . Total Bilirubin 05/05/2017 0.5  0.2 - 1.2 mg/dL Final  . Alkaline Phosphatase 05/05/2017 71  39 - 117 U/L Final  . AST 05/05/2017 22  0 - 37 U/L Final  . ALT 05/05/2017 19  0 - 53 U/L Final  . Total Protein 05/05/2017 7.3  6.0 - 8.3 g/dL Final  . Albumin 05/05/2017 3.5  3.5 - 5.2 g/dL Final  . Calcium 05/05/2017 9.3  8.4 - 10.5 mg/dL Final  . GFR 05/05/2017 43.71* >60.00 mL/min Final  . VITD 05/05/2017 18.30* 30.00 - 100.00 ng/mL Final     REVIEW OF SYSTEMS:        Eyes: No known diabetic retinopathy, has had macular edema, recently had surgery   Hypertension: has had a long history of high blood pressure   Currently on regimen of 10 mg lisinopril, amlodipine 10 mg and clonidine 0.1 mg twice a day Now treated by nephrologist  Monitoring at home also, he thinks his blood pressure has been about 101-751 systolic and was fairly good with nephrologist yesterday He did not take his medication and he thinks this is why it is higher in the office today  NEPHROPATHY: He has mild persistent increase in microalbumin and has had follow-up with nephrologist  Creatinine had increased in 11/15 and now appears to have plateaued with GFR about 44    Lab Results  Component Value Date   CREATININE 2.02 (H) 05/05/2017   CREATININE 2.18 (H) 01/30/2017   CREATININE 1.72 (H) 10/07/2016   CREATININE 1.34 06/06/2016       VITAMIN D deficiency: This was done as a screening and it is extremely low at 5.8.  Has been on 2000 U  supplements only recently in his levels are still low Not on calcitriol is currently in calcium is normal  Lab Results  Component Value Date   VD25OH 18.30 (L) 05/05/2017   VD25OH 17.11 (L) 10/07/2016   VD25OH 5.89 (L) 12/28/2015      PHYSICAL EXAM:  BP (!) 158/88 (BP Location: Left Arm, Patient Position: Sitting, Cuff Size: Large)   Pulse 78   Ht 6' (1.829 m)   Wt 243 lb 3.2 oz (110.3 kg)   SpO2 96%   BMI 32.98 kg/m      ASSESSMENT/PLAN:   DIABETES type II, non- non-insulin-requiring See history of present illness for detailed discussion of current diabetes management, blood sugar patterns and problems identified  His A1c has improved and with his weight loss using Ozempic and improved diet this is now 6.5 He does report mild increase in fasting readings and lab glucose was over 180 but not clear how often he is monitoring at home Have recommended metformin since GFR is 44 but he does not want to do this now For now will continue same regimen He will continue to increase exercise as tolerated   Macroprolactinoma of the pituitary gland with very high baseline prolactin level of 1315 He has done  well with Dostinex and is not taking 0.25 mg twice a week  Prolactin level has been consistently normal and even with lower doses of cabergoline   HYPOGONADISM:    This appears to have almost resolved with treatment of a prolactinoma and possibly improve with weight loss Testosterone level is over 300 and he is not symptomatic Will also check free testosterone on the next visit  HYPERTENSION: Followed by nephrologist, on 3 drugs now, usually has good control but higher reading today in the office  CKD: This is appearing to be from diabetes and hypertension and he will follow-up with nephrologist again  Roberto [name] 05/26/2017, 11:03 AM   Note: This office note was prepared with Dragon voice recognition system technology. Any transcriptional errors that result from this  process are unintentional.

## 2017-05-26 NOTE — Patient Instructions (Signed)
Vitamin D 4-5k daily

## 2017-06-01 DIAGNOSIS — H4051X4 Glaucoma secondary to other eye disorders, right eye, indeterminate stage: Secondary | ICD-10-CM | POA: Diagnosis not present

## 2017-06-28 ENCOUNTER — Other Ambulatory Visit: Payer: Self-pay | Admitting: Endocrinology

## 2017-07-06 DIAGNOSIS — H4051X4 Glaucoma secondary to other eye disorders, right eye, indeterminate stage: Secondary | ICD-10-CM | POA: Diagnosis not present

## 2017-07-06 DIAGNOSIS — H33002 Unspecified retinal detachment with retinal break, left eye: Secondary | ICD-10-CM | POA: Diagnosis not present

## 2017-07-07 DIAGNOSIS — H3322 Serous retinal detachment, left eye: Secondary | ICD-10-CM | POA: Diagnosis not present

## 2017-07-07 DIAGNOSIS — H4440 Unspecified hypotony of eye: Secondary | ICD-10-CM | POA: Diagnosis not present

## 2017-07-07 DIAGNOSIS — H33022 Retinal detachment with multiple breaks, left eye: Secondary | ICD-10-CM | POA: Diagnosis not present

## 2017-07-24 ENCOUNTER — Other Ambulatory Visit: Payer: Self-pay

## 2017-07-24 ENCOUNTER — Telehealth: Payer: Self-pay | Admitting: Endocrinology

## 2017-07-24 MED ORDER — GLIPIZIDE ER 5 MG PO TB24: 5.0000 mg | ORAL_TABLET | Freq: Every day | ORAL | 3 refills | Status: DC

## 2017-07-24 NOTE — Telephone Encounter (Signed)
New rx sent to pharmacy requested.

## 2017-07-24 NOTE — Telephone Encounter (Signed)
Pt called about the medication of glipiZIDE (GLUCOTROL XL) 5 MG 24 hr tablet dosage should be two tabs once a day.   Pharmacy is [id number] - [place], [place] - [address]. AT [address]  Call pt @ [phone]. Thank you!

## 2017-08-06 DIAGNOSIS — E103313 Type 1 diabetes mellitus with moderate nonproliferative diabetic retinopathy with macular edema, bilateral: Secondary | ICD-10-CM | POA: Diagnosis not present

## 2017-08-17 ENCOUNTER — Other Ambulatory Visit: Payer: Self-pay | Admitting: Endocrinology

## 2017-08-18 ENCOUNTER — Other Ambulatory Visit: Payer: BLUE CROSS/BLUE SHIELD

## 2017-08-20 DIAGNOSIS — N183 Chronic kidney disease, stage 3 (moderate): Secondary | ICD-10-CM | POA: Diagnosis not present

## 2017-08-20 DIAGNOSIS — E1122 Type 2 diabetes mellitus with diabetic chronic kidney disease: Secondary | ICD-10-CM | POA: Diagnosis not present

## 2017-08-20 DIAGNOSIS — I129 Hypertensive chronic kidney disease with stage 1 through stage 4 chronic kidney disease, or unspecified chronic kidney disease: Secondary | ICD-10-CM | POA: Diagnosis not present

## 2017-08-21 ENCOUNTER — Ambulatory Visit: Payer: BLUE CROSS/BLUE SHIELD | Admitting: Endocrinology

## 2017-09-10 DIAGNOSIS — H33003 Unspecified retinal detachment with retinal break, bilateral: Secondary | ICD-10-CM | POA: Diagnosis not present

## 2017-09-10 DIAGNOSIS — E103313 Type 1 diabetes mellitus with moderate nonproliferative diabetic retinopathy with macular edema, bilateral: Secondary | ICD-10-CM | POA: Diagnosis not present

## 2017-10-26 ENCOUNTER — Other Ambulatory Visit: Payer: BLUE CROSS/BLUE SHIELD

## 2017-10-30 ENCOUNTER — Ambulatory Visit: Payer: BLUE CROSS/BLUE SHIELD | Admitting: Endocrinology

## 2017-11-17 ENCOUNTER — Other Ambulatory Visit: Payer: Self-pay | Admitting: Endocrinology

## 2017-12-10 DIAGNOSIS — H33001 Unspecified retinal detachment with retinal break, right eye: Secondary | ICD-10-CM | POA: Diagnosis not present

## 2017-12-10 DIAGNOSIS — E103313 Type 1 diabetes mellitus with moderate nonproliferative diabetic retinopathy with macular edema, bilateral: Secondary | ICD-10-CM | POA: Diagnosis not present

## 2017-12-10 DIAGNOSIS — H25811 Combined forms of age-related cataract, right eye: Secondary | ICD-10-CM | POA: Diagnosis not present

## 2017-12-10 DIAGNOSIS — H40013 Open angle with borderline findings, low risk, bilateral: Secondary | ICD-10-CM | POA: Diagnosis not present

## 2017-12-17 DIAGNOSIS — I10 Essential (primary) hypertension: Secondary | ICD-10-CM | POA: Diagnosis not present

## 2017-12-17 DIAGNOSIS — N183 Chronic kidney disease, stage 3 (moderate): Secondary | ICD-10-CM | POA: Diagnosis not present

## 2017-12-17 DIAGNOSIS — D631 Anemia in chronic kidney disease: Secondary | ICD-10-CM | POA: Diagnosis not present

## 2017-12-17 DIAGNOSIS — E119 Type 2 diabetes mellitus without complications: Secondary | ICD-10-CM | POA: Diagnosis not present

## 2017-12-21 ENCOUNTER — Other Ambulatory Visit (INDEPENDENT_AMBULATORY_CARE_PROVIDER_SITE_OTHER): Payer: BLUE CROSS/BLUE SHIELD

## 2017-12-21 DIAGNOSIS — E1165 Type 2 diabetes mellitus with hyperglycemia: Secondary | ICD-10-CM | POA: Diagnosis not present

## 2017-12-21 DIAGNOSIS — D352 Benign neoplasm of pituitary gland: Secondary | ICD-10-CM

## 2017-12-21 DIAGNOSIS — E559 Vitamin D deficiency, unspecified: Secondary | ICD-10-CM | POA: Diagnosis not present

## 2017-12-21 LAB — LIPID PANEL
CHOL/HDL RATIO: 4
Cholesterol: 121 mg/dL (ref 0–200)
HDL: 29.8 mg/dL — ABNORMAL LOW (ref >39.00)
LDL CALC: 78 mg/dL (ref 0–99)
NONHDL: 91.6
Triglycerides: 68 mg/dL (ref 0.0–149.0)
VLDL: 13.6 mg/dL (ref 0.0–40.0)

## 2017-12-21 LAB — COMPREHENSIVE METABOLIC PANEL
ALT: 18 U/L (ref 0–53)
AST: 25 U/L (ref 0–37)
Albumin: 3.8 g/dL (ref 3.5–5.2)
Alkaline Phosphatase: 69 U/L (ref 39–117)
BUN: 37 mg/dL — ABNORMAL HIGH (ref 6–23)
CALCIUM: 9.2 mg/dL (ref 8.4–10.5)
CHLORIDE: 101 meq/L (ref 96–112)
CO2: 28 meq/L (ref 19–32)
Creatinine, Ser: 2.22 mg/dL — ABNORMAL HIGH (ref 0.40–1.50)
GFR: 39.12 mL/min — ABNORMAL LOW (ref >60.00)
Glucose, Bld: 151 mg/dL — ABNORMAL HIGH (ref 70–99)
POTASSIUM: 3.5 meq/L (ref 3.5–5.1)
Sodium: 137 mEq/L (ref 135–145)
Total Bilirubin: 0.5 mg/dL (ref 0.2–1.2)
Total Protein: 7.5 g/dL (ref 6.0–8.3)

## 2017-12-21 LAB — HEMOGLOBIN A1C: Hgb A1c MFr Bld: 6.1 % (ref 4.6–6.5)

## 2017-12-22 LAB — TESTOSTERONE, FREE, TOTAL, SHBG
SEX HORMONE BINDING: 48.7 nmol/L (ref 19.3–76.4)
Testosterone, Free: 12.3 pg/mL (ref 7.2–24.0)
Testosterone: 484 ng/dL (ref 264–916)

## 2017-12-22 LAB — PROLACTIN: PROLACTIN: 3.7 ng/mL — AB (ref 4.0–15.2)

## 2017-12-22 LAB — VITAMIN D 25 HYDROXY (VIT D DEFICIENCY, FRACTURES): VITD: 24.07 ng/mL — AB (ref 30.00–100.00)

## 2017-12-25 ENCOUNTER — Encounter: Payer: Self-pay | Admitting: Endocrinology

## 2017-12-25 ENCOUNTER — Ambulatory Visit: Payer: BLUE CROSS/BLUE SHIELD | Admitting: Endocrinology

## 2017-12-25 VITALS — BP 144/82 | HR 87 | Ht 71.0 in | Wt 235.0 lb

## 2017-12-25 DIAGNOSIS — E559 Vitamin D deficiency, unspecified: Secondary | ICD-10-CM

## 2017-12-25 DIAGNOSIS — D352 Benign neoplasm of pituitary gland: Secondary | ICD-10-CM

## 2017-12-25 DIAGNOSIS — E1169 Type 2 diabetes mellitus with other specified complication: Secondary | ICD-10-CM | POA: Diagnosis not present

## 2017-12-25 DIAGNOSIS — E669 Obesity, unspecified: Secondary | ICD-10-CM

## 2017-12-25 DIAGNOSIS — I10 Essential (primary) hypertension: Secondary | ICD-10-CM | POA: Diagnosis not present

## 2017-12-25 NOTE — Progress Notes (Signed)
Patient ID: Roberto [name], male   DOB: 1960/10/13, 57 y.o.   MRN: [id number]   Chief complaint: Follow-up of multiple endocrine problems  History of Present Illness:  PROBLEM 1: Pituitary tumor  His MRI on 07/18/14 showed a pituitary tumor with the following description: 2.3 x 2 x 1.8 cm mass centered in the sella with suprasellar extension and cavernous sinus extension greater on the left with an appearance most suggestive of pituitary macroadenoma. This impresses upon the optic chiasm  Had difficulty with peripheral vision on the left side but this has resolved He being followed by ophthalmologist periodically for this and macular edema   MRI in 2/17 showing focal area of delayed enhancement this represents a pituitary adenoma on the left.  This lesion measures 15 x 12 x 11 mm.   Has had a follow-up in 12/18 which showed the following Slightly distorted architecture of the pituitary gland with rightward deviation of the pituitary stalk and rightward positioning of the pituitary gland and the sella with T2 hyperintense material on the left which could be cystic treated tumor versus CSF. A discrete mass with mass effect on the chiasm is not identified.  No complaints of headaches  PROBLEM 2:  Hyperprolactinemia:  At baseline he had a prolactin level checked and this was markedly increased at 1312 He had symptoms of decreased libido and gynecomastia Testosterone level was low at 70  He  was started on Dostinex 0.5 mg initially half tablet twice a week and then 1 tablet twice a week on his initial consultation on 08/04/14 He is tolerating the Dostinex well. Prolactin has been consistently normal since 09/2014   He is still taking half tablet of Dostinex twice a week, the dose was reduced in 09/2016 Prolactin had been consistently normal but now it is down to 3.7  Lab Results  Component Value Date   PROLACTIN 3.7 (L) 12/21/2017   PROLACTIN 6.7 05/05/2017   PROLACTIN 8.1  01/30/2017   PROLACTIN 3.1 (L) 10/07/2016   PROLACTIN 5.0 06/06/2016   . HYPOGONADISM:   This has resolved No complaints of unusual fatigue Although testosterone was significantly low after his tumor was diagnosed with has improved in 2018    Lab Results  Component Value Date   TESTOSTERONE 484 12/21/2017    PROBLEM 3:  DIABETES type II with obesity    He was diagnosed  in 2014  and has been Previously managed by a primary care physician who he works with. On admission to the hospital in 2016 his glucose was 380 without any evidence of ketosis  Highest A1c had been 8%, subsequently in the normal range since 11/18 A1c is now 6.1  He does not bring his monitor for download again  NON-insulin hypoglycemic drugs currently used: Ozempic 0.5 mg weekly, , glipizide ER 5 mg daily  Current management, blood sugar patterns and problems identified:  He says his blood sugars are near normal  He tries to do a 12-hour fast on Saturdays and Mondays  Has continued to lose weight  Also doing significant amount of resistance exercises  No side effects with using Ozempic  Usually also following the diet given by the dietitian  No symptoms of hypoglycemia also  Lab glucose was 151 after lunch  By recall his blood sugars are as follows: 130-16 0 range fasting and 120 after meals  Weight history:  Wt Readings from Last 3 Encounters:  12/25/17 235 lb (106.6 kg)  05/26/17 243 lb 3.2 oz (110.3  kg)  03/25/17 250 lb (113.4 kg)    Lab Results  Component Value Date   HGBA1C 6.1 12/21/2017   HGBA1C 6.5 05/26/2017   HGBA1C 5.7 01/30/2017   Lab Results  Component Value Date   MICROALBUR 90.6 (H) 10/10/2016   LDLCALC 78 12/21/2017   CREATININE 2.22 (H) 12/21/2017    OTHER active problems and review of systems    Past Medical History:  Diagnosis Date  . Anemia   . Diabetes mellitus without complication ([place])   . H/O hypogonadism   . Hypertension   . Prolactinoma,  benign [facility])     Past Surgical History:  Procedure Laterality Date  . [place]  . NO PAST SURGERIES      Family History  Problem Relation Age of Onset  . Diabetes Mother   . Colon polyps Mother   . Heart disease Mother   . Hypertension Mother   . Colon cancer Father   . Kidney disease Maternal Grandmother   . Colon cancer Maternal Grandfather   . Esophageal cancer Maternal Uncle   . Colon cancer Maternal Uncle     Social History:  reports that he has never smoked. He has never used smokeless tobacco. He reports that he does not drink alcohol or use drugs.  Allergies: No Known Allergies  Allergies as of 12/25/2017   No Known Allergies     Medication List        Accurate as of 12/25/17  1:57 PM. Always use your most recent med list.          amLODipine 10 MG tablet Commonly known as:  NORVASC Take 10 mg by mouth daily.   aspirin EC 81 MG tablet Take 81 mg by mouth daily.   cabergoline 0.5 MG tablet Commonly known as:  DOSTINEX TAKE 1 TABLET TWICE A WEEK   cholecalciferol 1000 units tablet Commonly known as:  VITAMIN D Take 2,000 Units by mouth daily.   cloNIDine 0.1 MG tablet Commonly known as:  CATAPRES Take 0.1 mg by mouth 2 (two) times daily.   glipiZIDE 5 MG 24 hr tablet Commonly known as:  GLUCOTROL XL TAKE 2 TABLETS EVERY DAY WITH BREAKFAST   isosorbide mononitrate 60 MG 24 hr tablet Commonly known as:  IMDUR Take 60 mg by mouth daily.   lisinopril 10 MG tablet Commonly known as:  PRINIVIL,ZESTRIL Take 10 mg by mouth daily.   metoprolol succinate 50 MG 24 hr tablet Commonly known as:  TOPROL-XL Take 50 mg by mouth daily. Take with or immediately following a meal.   OZEMPIC (0.25 OR 0.5 MG/DOSE) 2 MG/1.5ML Sopn Generic drug:  Semaglutide(0.25 or 0.5MG /DOS) INJECT 0.5 MG INTO THE SKIN ONCE A WEEK. START WITH 0.25MG  FOR 1ST 2 SHOTS       LABS:  Lab on 12/21/2017  Component Date Value Ref Range Status  . VITD  12/21/2017 24.07* 30.00 - 100.00 ng/mL Final  . Prolactin 12/21/2017 3.7* 4.0 - 15.2 ng/mL Final  . Testosterone 12/21/2017 484  264 - 916 ng/dL Final   Comment: Adult male reference interval is based on a population of healthy nonobese males (BMI <30) between 73 and 52 years old. [place], [place] [phone]. PMID: [id number].   Marland Kitchen Testosterone, Free 12/21/2017 12.3  7.2 - 24.0 pg/mL Final  . Sex Hormone Binding 12/21/2017 48.7  19.3 - 76.4 nmol/L Final  . Cholesterol 12/21/2017 121  0 - 200 mg/dL Final   ATP III Classification  Desirable:  < 200 mg/dL               Borderline High:  200 - 239 mg/dL          High:  > = 240 mg/dL  . Triglycerides 12/21/2017 68.0  0.0 - 149.0 mg/dL Final   Normal:  <150 mg/dLBorderline High:  150 - 199 mg/dL  . HDL 12/21/2017 29.80* >39.00 mg/dL Final  . VLDL 12/21/2017 13.6  0.0 - 40.0 mg/dL Final  . LDL Cholesterol 12/21/2017 78  0 - 99 mg/dL Final  . Total CHOL/HDL Ratio 12/21/2017 4   Final                  Men          Women1/2 Average Risk     3.4          3.3Average Risk          5.0          4.42X Average Risk          9.6          7.13X Average Risk          15.0          11.0                      . NonHDL 12/21/2017 91.60   Final   NOTE:  Non-HDL goal should be 30 mg/dL higher than patient's LDL goal (i.e. LDL goal of < 70 mg/dL, would have non-HDL goal of < 100 mg/dL)  . Sodium 12/21/2017 137  135 - 145 mEq/L Final  . Potassium 12/21/2017 3.5  3.5 - 5.1 mEq/L Final  . Chloride 12/21/2017 101  96 - 112 mEq/L Final  . CO2 12/21/2017 28  19 - 32 mEq/L Final  . Glucose, Bld 12/21/2017 151* 70 - 99 mg/dL Final  . BUN 12/21/2017 37* 6 - 23 mg/dL Final  . Creatinine, Ser 12/21/2017 2.22* 0.40 - 1.50 mg/dL Final  . Total Bilirubin 12/21/2017 0.5  0.2 - 1.2 mg/dL Final  . Alkaline Phosphatase 12/21/2017 69  39 - 117 U/L Final  . AST 12/21/2017 25  0 - 37 U/L Final  . ALT 12/21/2017 18  0 - 53 U/L Final  . Total Protein 12/21/2017 7.5   6.0 - 8.3 g/dL Final  . Albumin 12/21/2017 3.8  3.5 - 5.2 g/dL Final  . Calcium 12/21/2017 9.2  8.4 - 10.5 mg/dL Final  . GFR 12/21/2017 39.12* >60.00 mL/min Final  . Hgb A1c MFr Bld 12/21/2017 6.1  4.6 - 6.5 % Final   Glycemic Control Guidelines for People with Diabetes:Non Diabetic:  <6%Goal of Therapy: <7%Additional Action Suggested:  >8%      REVIEW OF SYSTEMS:        Eyes: No known diabetic retinopathy, has had macular edema   Hypertension: has had a long history of high blood pressure   Currently on regimen of 10 mg lisinopril, amlodipine 10 mg, metoprolol 50 and clonidine 0.1 mg twice a day Now treated by nephrologist  Monitoring at home also, he thinks his blood pressure has been about 443-154 systolic and was fairly good with nephrologist yesterday He did not take his medication and he thinks this is why it is higher in the office today  NEPHROPATHY: He has persistent increase in microalbumin Has had renal biopsy Followed by nephrologist with stable levels of chronic kidney disease now    Lab  Results  Component Value Date   CREATININE 2.22 (H) 12/21/2017   CREATININE 2.02 (H) 05/05/2017   CREATININE 2.18 (H) 01/30/2017   CREATININE 1.72 (H) 10/07/2016       VITAMIN D deficiency: This was done as a screening and it is extremely low at 5.8.   Has been on 2000 U supplements only recently in his levels are still low Not on calcitriol is currently in calcium is normal  Lab Results  Component Value Date   VD25OH 24.07 (L) 12/21/2017   VD25OH 18.30 (L) 05/05/2017   VD25OH 17.11 (L) 10/07/2016      PHYSICAL EXAM:  BP (!) 144/82   Pulse 87   Ht 5\' 11"  (1.803 m)   Wt 235 lb (106.6 kg)   BMI 32.78 kg/m      ASSESSMENT/PLAN:   DIABETES type II, non- non-insulin-requiring See history of present illness for detailed discussion of current diabetes management, blood sugar patterns and problems identified  His A1c has improved further and now is 6.1 With  regular exercise, watching his diet he has had weight loss and also from using Ozempic He reports fairly good blood sugars at home although has not again brought his monitor for review Discussed that there is a potential for hypoglycemia and he will let us know if he has any symptoms, will reduce his glipizide to 2.5 mg if needed Discussed blood sugar targets He does want to see the dietitian again and was referred  Macroprolactinoma of the pituitary gland with very high baseline prolactin level of 1315 He has done  well with Dostinex and is still taking 0.25 mg twice a week MRI results reviewed and discussed with patient  Prolactin level has declined further and now 3.7  Since he does not have a discrete pituitary mass on his last MRI does not need to suppress his prolactin levels and can start taking cabergoline once a week now   HYPOGONADISM:    This appears to have almost resolved with treatment of a prolactinoma and weight loss Testosterone level is very normal   HYPERTENSION: Followed by nephrologist, on multiple medications and usually has good control but usually slightly higher reading in the office  CKD: This has been diagnosed be from diabetes and hypertension, stable and he will follow-up with nephrologist as before  Vitamin D deficiency: He will increase his supplement to 5000 units daily to get levels above 30  LIPIDS: LDL is below 100, not on statin drug and hopefully HDL will improve further with exercise and weight loss  Total visit time for evaluation and management of multiple problems and counseling =25 minutes  Elayne Snare 12/25/2017, 1:57 PM   Note: This office note was prepared with Dragon voice recognition system technology. Any transcriptional errors that result from this process are unintentional.

## 2017-12-25 NOTE — Patient Instructions (Addendum)
Cabergoline weekly only  Vitamin 5000 U daily

## 2018-01-17 ENCOUNTER — Other Ambulatory Visit: Payer: Self-pay | Admitting: Endocrinology

## 2018-01-25 DIAGNOSIS — H25811 Combined forms of age-related cataract, right eye: Secondary | ICD-10-CM | POA: Diagnosis not present

## 2018-01-25 DIAGNOSIS — E103313 Type 1 diabetes mellitus with moderate nonproliferative diabetic retinopathy with macular edema, bilateral: Secondary | ICD-10-CM | POA: Diagnosis not present

## 2018-01-25 DIAGNOSIS — H40013 Open angle with borderline findings, low risk, bilateral: Secondary | ICD-10-CM | POA: Diagnosis not present

## 2018-01-25 DIAGNOSIS — H40003 Preglaucoma, unspecified, bilateral: Secondary | ICD-10-CM | POA: Diagnosis not present

## 2018-01-28 DIAGNOSIS — H2513 Age-related nuclear cataract, bilateral: Secondary | ICD-10-CM | POA: Diagnosis not present

## 2018-01-28 DIAGNOSIS — E103313 Type 1 diabetes mellitus with moderate nonproliferative diabetic retinopathy with macular edema, bilateral: Secondary | ICD-10-CM | POA: Diagnosis not present

## 2018-01-28 DIAGNOSIS — H40013 Open angle with borderline findings, low risk, bilateral: Secondary | ICD-10-CM | POA: Diagnosis not present

## 2018-01-28 DIAGNOSIS — Z8669 Personal history of other diseases of the nervous system and sense organs: Secondary | ICD-10-CM | POA: Diagnosis not present

## 2018-02-11 ENCOUNTER — Encounter: Payer: BLUE CROSS/BLUE SHIELD | Attending: Endocrinology | Admitting: Dietician

## 2018-02-11 DIAGNOSIS — E669 Obesity, unspecified: Secondary | ICD-10-CM | POA: Diagnosis not present

## 2018-02-11 DIAGNOSIS — E1169 Type 2 diabetes mellitus with other specified complication: Secondary | ICD-10-CM | POA: Diagnosis not present

## 2018-02-11 DIAGNOSIS — Z713 Dietary counseling and surveillance: Secondary | ICD-10-CM | POA: Insufficient documentation

## 2018-02-11 DIAGNOSIS — E119 Type 2 diabetes mellitus without complications: Secondary | ICD-10-CM

## 2018-02-11 NOTE — Progress Notes (Signed)
  Medical Nutrition Therapy:  Appt start time: 2956 end time:  2130.   Assessment:  Primary concerns today: Patient is here today for follow up for Type 2 Diabetes and weight .  His last visit was 01/30/1017.   He is walking 1 mile and lifting weights for 30 minutes 3 times per week.   He is sleeping 8-9 hours per night.   He is eating 2 meals daily with a protein and 2 vegetables as well as fasting on Friday and Monday until dinner. He has lost from 256 lbs 1 year ago to 135 lbs today.  16 lb fat loss since 2016 based on [facility] results.  History includes Vitamin D deficiency and is now supplementing 5,000 units daily.  Last Vitamin D ws 24  12/21/17. Other labs 12/21/17 include Cholesterol 121, HDL 29.8, LDL 78, Triglycerides 68, GFR 39 ("decreased after contrast study"), BUN 37, Creatinine 2.22, Potassium 3.5, Alk Phos 69 and A1C 6.1% (decreased from 8% 09/2016).  [facility]  BODY COMP RESULTS (?acuracy in this pt) 10/17/14 261   BMI (kg/m^2) 35.4   Fat Mass (lbs) 52.5   Fat Free Mass (lbs) 208.5   Total Body Water (lbs) 152.5    [facility]  BODY COMP RESULTS 01/30/17 256 lbs   BMI (kg/m^2) 34.7   Fat Mass (lbs) 40.2   Fat Free Mass (lbs) 215.8   Total Body Water (lbs) 164   [facility]  BODY COMP RESULTS 02/11/18 235 lbs   BMI (kg/m^2) 32   Fat Mass (lbs) 36.6   Fat Free Mass (lbs) 199   Total Body Water (lbs) 149.6   Preferred Learning Style:   No preference indicated   Learning Readiness:   Ready  MEDICATIONS: see list to include Ozempic, glipizide, vitamin D   DIETARY INTAKE:  Usual eating pattern includes 2 meals and 0 snacks per day. Fasts all day until dinner 2 days per week.  24-hr recall:  B ( AM):   Snk ( AM):   L ( PM): meat and 2 vegetables Snk ( PM):  D ( PM): meat and 2 vegetables Snk ( PM):  Beverages: water  Progress Towards Goal(s):  In progress.   Nutritional Diagnosis:  NB-1.1 Food and nutrition-related knowledge deficit As related to balance of  carbohydrate, protein, and fat.  As evidenced by patient report.    Intervention:  Nutrition counseling/education continued.  Encouraged continued positive changes with improved diet variety and balance as well as exercise. .  Continue to be mindful of food choices. Consider increasing variety within meals and adding raw vegetables. Continue to get adequate sleep. Continue the vitamin D daily.  Teaching Method Utilized:  Auditory   Barriers to learning/adherence to lifestyle change: none  Demonstrated degree of understanding via:  Teach Back   Monitoring/Evaluation:  Dietary intake, exercise, and body weight prn.

## 2018-02-12 NOTE — Patient Instructions (Addendum)
Continue to be mindful of food choices. Consider increasing variety within meals and adding raw vegetables. Continue to get adequate sleep. Continue the vitamin D daily.

## 2018-03-03 DIAGNOSIS — H2512 Age-related nuclear cataract, left eye: Secondary | ICD-10-CM | POA: Diagnosis not present

## 2018-03-03 DIAGNOSIS — H33022 Retinal detachment with multiple breaks, left eye: Secondary | ICD-10-CM | POA: Diagnosis not present

## 2018-03-08 DIAGNOSIS — H2512 Age-related nuclear cataract, left eye: Secondary | ICD-10-CM | POA: Diagnosis not present

## 2018-03-08 DIAGNOSIS — H5703 Miosis: Secondary | ICD-10-CM | POA: Diagnosis not present

## 2018-03-08 DIAGNOSIS — H35372 Puckering of macula, left eye: Secondary | ICD-10-CM | POA: Diagnosis not present

## 2018-03-08 DIAGNOSIS — H3322 Serous retinal detachment, left eye: Secondary | ICD-10-CM | POA: Diagnosis not present

## 2018-04-05 DIAGNOSIS — I10 Essential (primary) hypertension: Secondary | ICD-10-CM | POA: Diagnosis not present

## 2018-04-05 DIAGNOSIS — E119 Type 2 diabetes mellitus without complications: Secondary | ICD-10-CM | POA: Diagnosis not present

## 2018-04-05 DIAGNOSIS — N183 Chronic kidney disease, stage 3 (moderate): Secondary | ICD-10-CM | POA: Diagnosis not present

## 2018-05-07 ENCOUNTER — Other Ambulatory Visit: Payer: Self-pay | Admitting: Endocrinology

## 2018-06-03 DIAGNOSIS — H33003 Unspecified retinal detachment with retinal break, bilateral: Secondary | ICD-10-CM | POA: Diagnosis not present

## 2018-06-03 DIAGNOSIS — E103313 Type 1 diabetes mellitus with moderate nonproliferative diabetic retinopathy with macular edema, bilateral: Secondary | ICD-10-CM | POA: Diagnosis not present

## 2018-06-29 ENCOUNTER — Other Ambulatory Visit: Payer: BLUE CROSS/BLUE SHIELD

## 2018-07-02 ENCOUNTER — Ambulatory Visit: Payer: BLUE CROSS/BLUE SHIELD | Admitting: Endocrinology

## 2018-08-04 ENCOUNTER — Encounter (INDEPENDENT_AMBULATORY_CARE_PROVIDER_SITE_OTHER): Payer: Self-pay

## 2018-08-09 ENCOUNTER — Other Ambulatory Visit: Payer: BLUE CROSS/BLUE SHIELD

## 2018-08-13 ENCOUNTER — Ambulatory Visit: Payer: BLUE CROSS/BLUE SHIELD | Admitting: Endocrinology

## 2018-08-15 ENCOUNTER — Other Ambulatory Visit: Payer: Self-pay | Admitting: Endocrinology

## 2018-09-21 ENCOUNTER — Other Ambulatory Visit: Payer: BLUE CROSS/BLUE SHIELD

## 2018-09-22 ENCOUNTER — Other Ambulatory Visit: Payer: Self-pay

## 2018-09-22 ENCOUNTER — Other Ambulatory Visit (INDEPENDENT_AMBULATORY_CARE_PROVIDER_SITE_OTHER): Payer: BC Managed Care – PPO

## 2018-09-22 ENCOUNTER — Other Ambulatory Visit: Payer: Self-pay | Admitting: Endocrinology

## 2018-09-22 DIAGNOSIS — D352 Benign neoplasm of pituitary gland: Secondary | ICD-10-CM

## 2018-09-22 DIAGNOSIS — E559 Vitamin D deficiency, unspecified: Secondary | ICD-10-CM

## 2018-09-22 DIAGNOSIS — E1169 Type 2 diabetes mellitus with other specified complication: Secondary | ICD-10-CM | POA: Diagnosis not present

## 2018-09-22 DIAGNOSIS — E669 Obesity, unspecified: Secondary | ICD-10-CM | POA: Diagnosis not present

## 2018-09-22 LAB — BASIC METABOLIC PANEL
BUN: 51 mg/dL — ABNORMAL HIGH (ref 6–23)
CO2: 25 mEq/L (ref 19–32)
Calcium: 8.6 mg/dL (ref 8.4–10.5)
Chloride: 105 mEq/L (ref 96–112)
Creatinine, Ser: 2.95 mg/dL — ABNORMAL HIGH (ref 0.40–1.50)
GFR: 26.44 mL/min — ABNORMAL LOW (ref >60.00)
Glucose, Bld: 138 mg/dL — ABNORMAL HIGH (ref 70–99)
Potassium: 3.9 mEq/L (ref 3.5–5.1)
Sodium: 138 mEq/L (ref 135–145)

## 2018-09-22 LAB — VITAMIN D 25 HYDROXY (VIT D DEFICIENCY, FRACTURES): VITD: 12.24 ng/mL — ABNORMAL LOW (ref 30.00–100.00)

## 2018-09-23 LAB — HEMOGLOBIN A1C: Hgb A1c MFr Bld: 5.8 % (ref 4.6–6.5)

## 2018-09-23 LAB — PROLACTIN: Prolactin: 7.8 ng/mL (ref 4.0–15.2)

## 2018-09-24 ENCOUNTER — Ambulatory Visit (INDEPENDENT_AMBULATORY_CARE_PROVIDER_SITE_OTHER): Payer: BC Managed Care – PPO | Admitting: Endocrinology

## 2018-09-24 ENCOUNTER — Other Ambulatory Visit: Payer: Self-pay

## 2018-09-24 ENCOUNTER — Encounter: Payer: Self-pay | Admitting: Endocrinology

## 2018-09-24 VITALS — BP 158/88 | HR 82 | Ht 71.0 in | Wt 239.0 lb

## 2018-09-24 DIAGNOSIS — D352 Benign neoplasm of pituitary gland: Secondary | ICD-10-CM | POA: Diagnosis not present

## 2018-09-24 DIAGNOSIS — I10 Essential (primary) hypertension: Secondary | ICD-10-CM

## 2018-09-24 DIAGNOSIS — E1121 Type 2 diabetes mellitus with diabetic nephropathy: Secondary | ICD-10-CM

## 2018-09-24 DIAGNOSIS — E559 Vitamin D deficiency, unspecified: Secondary | ICD-10-CM | POA: Diagnosis not present

## 2018-09-24 DIAGNOSIS — E1169 Type 2 diabetes mellitus with other specified complication: Secondary | ICD-10-CM | POA: Diagnosis not present

## 2018-09-24 DIAGNOSIS — E669 Obesity, unspecified: Secondary | ICD-10-CM

## 2018-09-24 NOTE — Progress Notes (Signed)
Patient ID: Roberto [name], male   DOB: [birth year], 58 y.o.   MRN: [id number]   Chief complaint: Follow-up of multiple endocrine problems  History of Present Illness:  PROBLEM 1: Pituitary tumor  His MRI on 07/18/14 showed a pituitary tumor with the following description: 2.3 x 2 x 1.8 cm mass centered in the sella with suprasellar extension and cavernous sinus extension greater on the left with an appearance most suggestive of pituitary macroadenoma. This impresses upon the optic chiasm  Had difficulty with peripheral vision on the left side but this has resolved He being followed by ophthalmologist periodically for this and macular edema   MRI in 2/17 showing focal area of delayed enhancement this represents a pituitary adenoma on the left.  This lesion measures 15 x 12 x 11 mm.   Has had a follow-up in 12/18 which showed the following Slightly distorted architecture of the pituitary gland with rightward deviation of the pituitary stalk and rightward positioning of the pituitary gland and the sella with T2 hyperintense material on the left which could be cystic treated tumor versus CSF.   A discrete mass with mass effect on the chiasm is not identified.   PROBLEM 2:  Hyperprolactinemia:  At baseline he had a prolactin level checked and this was markedly increased at 1312 He had symptoms of decreased libido and gynecomastia Testosterone level was low at 70  He  was started on Dostinex 0.5 mg initially half tablet twice a week and then 1 tablet twice a week on his initial consultation on 08/04/14 He is tolerating the Dostinex well. Prolactin has been consistently normal since 09/2014   He is still taking half tablet of Dostinex twice a week, the dose was supposed to be taken only once a week when his prolactin level was below normal on the last visit However still taking this twice a week on Mondays and Fridays  Prolactin level done on Wednesday was normal  Lab Results   Component Value Date   PROLACTIN 7.8 09/22/2018   PROLACTIN 3.7 (L) 12/21/2017   PROLACTIN 6.7 05/05/2017   PROLACTIN 8.1 01/30/2017   PROLACTIN 3.1 (L) 10/07/2016   . HYPOGONADISM:   This has resolved as of 9/19  No complaints of unusual fatigue Previously testosterone was significantly low after his tumor was diagnosed and has not needed any specific treatment Also has had weight loss since being initially seen   Lab Results  Component Value Date   TESTOSTERONE 484 12/21/2017    PROBLEM 3:  DIABETES type II with obesity    He was diagnosed  in 2014  and has been Previously managed by a primary care physician who he works with. On admission to the hospital in 2016 his glucose was 380 without any evidence of ketosis  Highest A1c had been 8%, subsequently in the normal range since 11/18 A1c is now 5.8, previously 6.1  He does not bring his monitor for download   NON-insulin hypoglycemic drugs currently used: Ozempic 0.5 mg weekly, , glipizide ER 10 mg daily  Current management, blood sugar patterns and problems identified:  He has been able to keep up with his exercise regularly on the treadmill at least 3 to 4 days a week  However his weight loss has leveled off  Even though he has been advised taking only 5 mg of glipizide he continues to take 10 mg  No hypoglycemia despite his renal function been worse  Blood sugar range at home: General range 110-115  and postprandial up to only 160 usually  He has been seen by the dietitian previously and generally tries to eat healthy  Lab glucose was 138 after lunch   Last visit with dietitian: 01/2018  Weight history:  Wt Readings from Last 3 Encounters:  09/24/18 239 lb (108.4 kg)  02/12/18 235 lb (106.6 kg)  12/25/17 235 lb (106.6 kg)    Lab Results  Component Value Date   HGBA1C 5.8 09/22/2018   HGBA1C 6.1 12/21/2017   HGBA1C 6.5 05/26/2017   Lab Results  Component Value Date   MICROALBUR 90.6 (H)  10/10/2016   LDLCALC 78 12/21/2017   CREATININE 2.95 (H) 09/22/2018    OTHER active problems and review of systems    Past Medical History:  Diagnosis Date  . Anemia   . Diabetes mellitus without complication ([place])   . H/O hypogonadism   . Hypertension   . Prolactinoma, benign [facility])     Past Surgical History:  Procedure Laterality Date  . [place]  . NO PAST SURGERIES      Family History  Problem Relation Age of Onset  . Diabetes Mother   . Colon polyps Mother   . Heart disease Mother   . Hypertension Mother   . Colon cancer Father   . Kidney disease Maternal Grandmother   . Colon cancer Maternal Grandfather   . Esophageal cancer Maternal Uncle   . Colon cancer Maternal Uncle     Social History:  reports that he has never smoked. He has never used smokeless tobacco. He reports that he does not drink alcohol or use drugs.  Allergies: No Known Allergies  Allergies as of 09/24/2018   No Known Allergies     Medication List       Accurate as of September 24, 2018 11:59 PM. If you have any questions, ask your nurse or doctor.        STOP taking these medications   metoprolol succinate 50 MG 24 hr tablet Commonly known as: TOPROL-XL Stopped by: Elayne Snare, MD     TAKE these medications   amLODipine 10 MG tablet Commonly known as: NORVASC Take 10 mg by mouth daily.   aspirin EC 81 MG tablet Take 81 mg by mouth daily.   cabergoline 0.5 MG tablet Commonly known as: DOSTINEX Take 0.5 tablets (0.25 mg total) by mouth 2 (two) times a week. Take 1 tablet twice a week What changed: additional instructions   cholecalciferol 1000 units tablet Commonly known as: VITAMIN D Take 2,000 Units by mouth daily.   cloNIDine 0.1 MG tablet Commonly known as: CATAPRES Take 0.1 mg by mouth 2 (two) times daily.   glipiZIDE 5 MG 24 hr tablet Commonly known as: GLUCOTROL XL Take 1 tablet (5 mg total) by mouth daily with breakfast. What changed:   how  much to take  additional instructions   isosorbide mononitrate 60 MG 24 hr tablet Commonly known as: IMDUR Take 60 mg by mouth daily.   lisinopril 10 MG tablet Commonly known as: ZESTRIL Take 10 mg by mouth daily.   Ozempic (0.25 or 0.5 MG/DOSE) 2 MG/1.5ML Sopn Generic drug: Semaglutide(0.25 or 0.5MG /DOS) INJECT 0.5 MG INTO THE SKIN ONCE A WEEK. START WITH 0.25MG  FOR 1ST 2 SHOTS       LABS:  Lab on 09/22/2018  Component Date Value Ref Range Status  . VITD 09/22/2018 12.24* 30.00 - 100.00 ng/mL Final  . Prolactin 09/22/2018 7.8  4.0 - 15.2 ng/mL Final  .  Sodium 09/22/2018 138  135 - 145 mEq/L Final  . Potassium 09/22/2018 3.9  3.5 - 5.1 mEq/L Final  . Chloride 09/22/2018 105  96 - 112 mEq/L Final  . CO2 09/22/2018 25  19 - 32 mEq/L Final  . Glucose, Bld 09/22/2018 138* 70 - 99 mg/dL Final  . BUN 09/22/2018 51* 6 - 23 mg/dL Final  . Creatinine, Ser 09/22/2018 2.95* 0.40 - 1.50 mg/dL Final  . Calcium 09/22/2018 8.6  8.4 - 10.5 mg/dL Final  . GFR 09/22/2018 26.44* >60.00 mL/min Final  . Hgb A1c MFr Bld 09/22/2018 5.8  4.6 - 6.5 % Final   Glycemic Control Guidelines for People with Diabetes:Non Diabetic:  <6%Goal of Therapy: <7%Additional Action Suggested:  >8%      REVIEW OF SYSTEMS:        Eyes: No known diabetic retinopathy, has had macular edema   Hypertension: has had a long history of high blood pressure   Currently on regimen of 10 mg lisinopril, amlodipine 10 mg, metoprolol 50 and clonidine 0.1 mg twice a day He is treated by nephrologist  Monitoring at home also, he thinks his blood pressure has been about 620-355 systolic systolic and 97-41 diastolic Readings blood pressure is higher today in the office because of feeling stressed  NEPHROPATHY: He has persistent increase in microalbumin Has had renal biopsy Followed by nephrologist with stable levels of chronic kidney disease now    Lab Results  Component Value Date   CREATININE 2.95 (H) 09/22/2018    CREATININE 2.22 (H) 12/21/2017   CREATININE 2.02 (H) 05/05/2017   CREATININE 2.18 (H) 01/30/2017    No symptoms of NEUROPATHY: Last foot exam 08/2018  Last eye exam 05/2017   VITAMIN D deficiency: This was done as a screening and it is extremely low at 5.8.   Has been on 3000U supplements However recently vitamin D levels are still low and lower now Not on calcitriol, currently serum calcium is normal  Lab Results  Component Value Date   VD25OH 12.24 (L) 09/22/2018   VD25OH 24.07 (L) 12/21/2017   VD25OH 18.30 (L) 05/05/2017      PHYSICAL EXAM:  BP (!) 158/88 (BP Location: Left Arm, Patient Position: Sitting, Cuff Size: Large)   Pulse 82   Ht 5\' 11"  (1.803 m)   Wt 239 lb (108.4 kg)   SpO2 96%   BMI 33.33 kg/m   Diabetic Foot Exam - Simple   Simple Foot Form Visual Inspection See comments: Yes Sensation Testing See comments: Yes Pulse Check See comments: Yes Comments Flat feet present bilaterally Significant soft tissue swelling of the lower leg around the ankle 1+ pitting edema on the left Monofilament sensation intact in the distal toes on the dorsum but decreased in the left plantar distal surface Dorsalis pedal pulse on the right 2/4 and 4/4 on the left, posterior tibialis pulses not palpated because of soft tissue swelling       ASSESSMENT/PLAN:   DIABETES type II, non--insulin-requiring with BMI now 33  See history of present illness for detailed discussion of current diabetes management, blood sugar patterns and problems identified  His A1c has improved further and now is 5.8 However not clear if this is partly affected by his anemia of renal disease. No hypoglycemic symptoms on 10 mg glipizide ER although this may need to be reduced with his renal dysfunction Weight has leveled off Encouraged him to stay consistent with diet and exercise also He is benefiting from [place] and will continue  We will also check fructosamine on next visit  No evidence  of neuropathy on exam today, only minimal decrease in plantar surface sensation which may not be accurate He is due for an eye exam and will schedule  As before he has not brought his monitor and not clear how often he is checking especially postprandial   Macroprolactinoma of the pituitary gland with very high baseline prolactin level of 1315 He has done  well with Dostinex and is still taking 0.25 mg twice a week MRI showed tumor on the last exam No recent headaches  Prolactin level is normal  He will continue taking cabergoline twice a day week, half tablet   HYPOGONADISM:    Previously levels normal and will repeat on next visit   HYPERTENSION: Followed by nephrologist, also monitored by patient at home He is on multiple medications and usually has good control but has a higher reading in the office He thinks he is having swelling from amlodipine although has only minimal pitting edema today on exam Recommended that he discuss using a clonidine patch with his nephrologist for better control and more even 24-hour efficacy and less side effects  CKD: This has been diagnosed be from diabetes and hypertension, recently worse and he will follow-up with nephrologist as scheduled next month  Vitamin D deficiency: His level is only 12 now Not clear how often or how much vitamin D he is taking consistently and will go ahead and switch him to 50,000 units weekly of vitamin D2 His level should be at least above 20  LIPIDS: LDL is below 100, not on statin drug and will periodically follow  Total visit time for evaluation and management of multiple problems and counseling =25 minutes    Elayne Snare 09/25/2018, 11:31 AM   Note: This office note was prepared with Dragon voice recognition system technology. Any transcriptional errors that result from this process are unintentional.

## 2018-09-25 ENCOUNTER — Encounter: Payer: Self-pay | Admitting: Endocrinology

## 2018-09-25 DIAGNOSIS — E559 Vitamin D deficiency, unspecified: Secondary | ICD-10-CM | POA: Insufficient documentation

## 2018-09-25 DIAGNOSIS — E1121 Type 2 diabetes mellitus with diabetic nephropathy: Secondary | ICD-10-CM | POA: Insufficient documentation

## 2018-09-25 DIAGNOSIS — I10 Essential (primary) hypertension: Secondary | ICD-10-CM | POA: Insufficient documentation

## 2018-09-25 MED ORDER — VITAMIN D (ERGOCALCIFEROL) 1.25 MG (50000 UNIT) PO CAPS: ORAL_CAPSULE | ORAL | 2 refills | Status: DC

## 2018-10-28 DIAGNOSIS — E119 Type 2 diabetes mellitus without complications: Secondary | ICD-10-CM | POA: Diagnosis not present

## 2018-10-28 DIAGNOSIS — N183 Chronic kidney disease, stage 3 (moderate): Secondary | ICD-10-CM | POA: Diagnosis not present

## 2018-10-28 DIAGNOSIS — I10 Essential (primary) hypertension: Secondary | ICD-10-CM | POA: Diagnosis not present

## 2018-11-13 ENCOUNTER — Other Ambulatory Visit: Payer: Self-pay | Admitting: Endocrinology

## 2018-11-15 ENCOUNTER — Telehealth: Payer: Self-pay | Admitting: Endocrinology

## 2018-11-15 NOTE — Telephone Encounter (Signed)
Called and left voicemail requesting that patient return our call.

## 2018-11-15 NOTE — Telephone Encounter (Signed)
Per last office note, pt is only supposed to be taking one Glipizide, and even possibly reduce that dose due to renal dysfunction. Do you want patient to be taking two tablets daily?

## 2018-11-15 NOTE — Telephone Encounter (Signed)
Dr. Mayer Masker called stating that his Rx is being written incorrectly, it should be written with 2 tablets not one.  Please Advise, Thanks

## 2018-11-15 NOTE — Telephone Encounter (Signed)
He can continue to take 5 mg twice daily

## 2018-11-15 NOTE — Telephone Encounter (Signed)
Which one?

## 2018-11-16 ENCOUNTER — Other Ambulatory Visit: Payer: Self-pay

## 2018-11-16 MED ORDER — GLIPIZIDE ER 5 MG PO TB24: ORAL_TABLET | ORAL | 0 refills | Status: DC

## 2018-11-16 NOTE — Telephone Encounter (Signed)
Received fax from after hours call line stating that the pt's pharmacy had called and stated that the pt continues to insist that he takes two Glipizide daily, rather than one. Per MD message yesterday, pt needs to continue to take one tablet daily only, and per last office visit note, pt needs to take one only due to renal dysfunction. Attempted to call pt and inform him of this again. This is now the second attempt to reach the patient. Left voicemail for pt requesting another call back. Pharmacy was then contacted to inform them of this as well as MD message. Instructed pharmacy to inform pt of this if he calls them again, or to have the patient contact this office to discuss.

## 2018-12-02 DIAGNOSIS — N2581 Secondary hyperparathyroidism of renal origin: Secondary | ICD-10-CM | POA: Diagnosis not present

## 2018-12-02 DIAGNOSIS — N183 Chronic kidney disease, stage 3 (moderate): Secondary | ICD-10-CM | POA: Diagnosis not present

## 2018-12-09 DIAGNOSIS — H401132 Primary open-angle glaucoma, bilateral, moderate stage: Secondary | ICD-10-CM | POA: Diagnosis not present

## 2018-12-09 DIAGNOSIS — H33001 Unspecified retinal detachment with retinal break, right eye: Secondary | ICD-10-CM | POA: Diagnosis not present

## 2018-12-09 DIAGNOSIS — H33002 Unspecified retinal detachment with retinal break, left eye: Secondary | ICD-10-CM | POA: Diagnosis not present

## 2018-12-09 DIAGNOSIS — E103313 Type 1 diabetes mellitus with moderate nonproliferative diabetic retinopathy with macular edema, bilateral: Secondary | ICD-10-CM | POA: Diagnosis not present

## 2018-12-22 ENCOUNTER — Other Ambulatory Visit: Payer: Self-pay | Admitting: Endocrinology

## 2019-01-26 ENCOUNTER — Other Ambulatory Visit: Payer: BC Managed Care – PPO

## 2019-01-28 ENCOUNTER — Ambulatory Visit: Payer: BC Managed Care – PPO | Admitting: Endocrinology

## 2019-02-08 ENCOUNTER — Other Ambulatory Visit: Payer: BC Managed Care – PPO

## 2019-02-11 ENCOUNTER — Ambulatory Visit: Payer: BC Managed Care – PPO | Admitting: Endocrinology

## 2019-02-23 DIAGNOSIS — E119 Type 2 diabetes mellitus without complications: Secondary | ICD-10-CM | POA: Diagnosis not present

## 2019-02-23 DIAGNOSIS — N183 Chronic kidney disease, stage 3 unspecified: Secondary | ICD-10-CM | POA: Diagnosis not present

## 2019-02-23 DIAGNOSIS — I10 Essential (primary) hypertension: Secondary | ICD-10-CM | POA: Diagnosis not present

## 2019-02-23 DIAGNOSIS — N2581 Secondary hyperparathyroidism of renal origin: Secondary | ICD-10-CM | POA: Diagnosis not present

## 2019-03-07 DIAGNOSIS — I10 Essential (primary) hypertension: Secondary | ICD-10-CM | POA: Diagnosis not present

## 2019-03-07 DIAGNOSIS — N1831 Chronic kidney disease, stage 3a: Secondary | ICD-10-CM | POA: Diagnosis not present

## 2019-03-07 DIAGNOSIS — N183 Chronic kidney disease, stage 3 unspecified: Secondary | ICD-10-CM | POA: Diagnosis not present

## 2019-03-11 DIAGNOSIS — H26492 Other secondary cataract, left eye: Secondary | ICD-10-CM | POA: Diagnosis not present

## 2019-03-14 DIAGNOSIS — N1831 Chronic kidney disease, stage 3a: Secondary | ICD-10-CM | POA: Diagnosis not present

## 2019-03-15 ENCOUNTER — Other Ambulatory Visit: Payer: Self-pay | Admitting: Nephrology

## 2019-03-15 DIAGNOSIS — R7989 Other specified abnormal findings of blood chemistry: Secondary | ICD-10-CM

## 2019-03-16 DIAGNOSIS — N2581 Secondary hyperparathyroidism of renal origin: Secondary | ICD-10-CM | POA: Diagnosis not present

## 2019-03-16 DIAGNOSIS — E119 Type 2 diabetes mellitus without complications: Secondary | ICD-10-CM | POA: Diagnosis not present

## 2019-03-16 DIAGNOSIS — I10 Essential (primary) hypertension: Secondary | ICD-10-CM | POA: Diagnosis not present

## 2019-03-16 DIAGNOSIS — N184 Chronic kidney disease, stage 4 (severe): Secondary | ICD-10-CM | POA: Diagnosis not present

## 2019-03-17 ENCOUNTER — Other Ambulatory Visit: Payer: BC Managed Care – PPO

## 2019-03-18 ENCOUNTER — Other Ambulatory Visit (INDEPENDENT_AMBULATORY_CARE_PROVIDER_SITE_OTHER): Payer: BC Managed Care – PPO

## 2019-03-18 DIAGNOSIS — D352 Benign neoplasm of pituitary gland: Secondary | ICD-10-CM

## 2019-03-18 DIAGNOSIS — E559 Vitamin D deficiency, unspecified: Secondary | ICD-10-CM

## 2019-03-18 DIAGNOSIS — E669 Obesity, unspecified: Secondary | ICD-10-CM

## 2019-03-18 DIAGNOSIS — E1169 Type 2 diabetes mellitus with other specified complication: Secondary | ICD-10-CM | POA: Diagnosis not present

## 2019-03-18 NOTE — Addendum Note (Signed)
Addended by: Kaylyn Lim I on: 03/18/2019 04:23 PM   Modules accepted: Orders

## 2019-03-18 NOTE — Addendum Note (Signed)
Addended by: Kaylyn Lim I on: 03/18/2019 04:24 PM   Modules accepted: Orders

## 2019-03-19 LAB — LIPID PANEL
Chol/HDL Ratio: 3.1 ratio (ref 0.0–5.0)
Cholesterol, Total: 112 mg/dL (ref 100–199)
HDL: 36 mg/dL — ABNORMAL LOW (ref >39)
LDL Chol Calc (NIH): 60 mg/dL (ref 0–99)
Triglycerides: 82 mg/dL (ref 0–149)
VLDL Cholesterol Cal: 16 mg/dL (ref 5–40)

## 2019-03-19 LAB — COMPREHENSIVE METABOLIC PANEL
ALT: 13 IU/L (ref 0–44)
AST: 17 IU/L (ref 0–40)
Albumin/Globulin Ratio: 1.3 (ref 1.2–2.2)
Albumin: 3.9 g/dL (ref 3.8–4.9)
Alkaline Phosphatase: 90 IU/L (ref 39–117)
BUN/Creatinine Ratio: 11 (ref 10–24)
BUN: 40 mg/dL — ABNORMAL HIGH (ref 8–27)
Bilirubin Total: 0.4 mg/dL (ref 0.0–1.2)
CO2: 20 mmol/L (ref 20–29)
Calcium: 9.1 mg/dL (ref 8.6–10.2)
Chloride: 107 mmol/L — ABNORMAL HIGH (ref 96–106)
Creatinine, Ser: 3.61 mg/dL — ABNORMAL HIGH (ref 0.76–1.27)
GFR calc Af Amer: 20 mL/min/{1.73_m2} — ABNORMAL LOW (ref >59)
GFR calc non Af Amer: 17 mL/min/{1.73_m2} — ABNORMAL LOW (ref >59)
Globulin, Total: 3 g/dL (ref 1.5–4.5)
Glucose: 108 mg/dL — ABNORMAL HIGH (ref 65–99)
Potassium: 3.8 mmol/L (ref 3.5–5.2)
Sodium: 140 mmol/L (ref 134–144)
Total Protein: 6.9 g/dL (ref 6.0–8.5)

## 2019-03-19 LAB — HEMOGLOBIN A1C
Est. average glucose Bld gHb Est-mCnc: 126 mg/dL
Hgb A1c MFr Bld: 6 % — ABNORMAL HIGH (ref 4.8–5.6)

## 2019-03-19 LAB — VITAMIN D 25 HYDROXY (VIT D DEFICIENCY, FRACTURES): Vit D, 25-Hydroxy: 42.6 ng/mL (ref 30.0–100.0)

## 2019-03-19 LAB — TESTOSTERONE: Testosterone: 245 ng/dL — ABNORMAL LOW (ref 264–916)

## 2019-03-19 LAB — PROLACTIN: Prolactin: 20.3 ng/mL — ABNORMAL HIGH (ref 4.0–15.2)

## 2019-03-20 ENCOUNTER — Encounter (HOSPITAL_COMMUNITY): Payer: Self-pay

## 2019-03-20 ENCOUNTER — Other Ambulatory Visit: Payer: Self-pay

## 2019-03-20 ENCOUNTER — Observation Stay (HOSPITAL_COMMUNITY)
Admission: EM | Admit: 2019-03-20 | Discharge: 2019-03-21 | Disposition: A | Discharge status: Home / Self Care | Payer: BC Managed Care – PPO | Attending: Internal Medicine | Admitting: Internal Medicine

## 2019-03-20 ENCOUNTER — Emergency Department (HOSPITAL_COMMUNITY): Payer: BC Managed Care – PPO

## 2019-03-20 DIAGNOSIS — N184 Chronic kidney disease, stage 4 (severe): Secondary | ICD-10-CM | POA: Diagnosis present

## 2019-03-20 DIAGNOSIS — R0789 Other chest pain: Secondary | ICD-10-CM | POA: Diagnosis not present

## 2019-03-20 DIAGNOSIS — E1122 Type 2 diabetes mellitus with diabetic chronic kidney disease: Secondary | ICD-10-CM | POA: Insufficient documentation

## 2019-03-20 DIAGNOSIS — Z79899 Other long term (current) drug therapy: Secondary | ICD-10-CM | POA: Insufficient documentation

## 2019-03-20 DIAGNOSIS — R079 Chest pain, unspecified: Secondary | ICD-10-CM

## 2019-03-20 DIAGNOSIS — I160 Hypertensive urgency: Principal | ICD-10-CM | POA: Insufficient documentation

## 2019-03-20 DIAGNOSIS — I129 Hypertensive chronic kidney disease with stage 1 through stage 4 chronic kidney disease, or unspecified chronic kidney disease: Secondary | ICD-10-CM | POA: Diagnosis not present

## 2019-03-20 DIAGNOSIS — Z7982 Long term (current) use of aspirin: Secondary | ICD-10-CM | POA: Insufficient documentation

## 2019-03-20 DIAGNOSIS — R072 Precordial pain: Secondary | ICD-10-CM | POA: Diagnosis not present

## 2019-03-20 DIAGNOSIS — D500 Iron deficiency anemia secondary to blood loss (chronic): Secondary | ICD-10-CM | POA: Insufficient documentation

## 2019-03-20 DIAGNOSIS — I248 Other forms of acute ischemic heart disease: Secondary | ICD-10-CM | POA: Diagnosis not present

## 2019-03-20 DIAGNOSIS — D649 Anemia, unspecified: Secondary | ICD-10-CM | POA: Diagnosis present

## 2019-03-20 DIAGNOSIS — R000 Tachycardia, unspecified: Secondary | ICD-10-CM | POA: Diagnosis not present

## 2019-03-20 DIAGNOSIS — Z20828 Contact with and (suspected) exposure to other viral communicable diseases: Secondary | ICD-10-CM | POA: Insufficient documentation

## 2019-03-20 DIAGNOSIS — E119 Type 2 diabetes mellitus without complications: Secondary | ICD-10-CM | POA: Diagnosis not present

## 2019-03-20 LAB — TROPONIN I (HIGH SENSITIVITY)
Troponin I (High Sensitivity): 34 ng/L — ABNORMAL HIGH (ref <18)
Troponin I (High Sensitivity): 43 ng/L — ABNORMAL HIGH (ref <18)

## 2019-03-20 LAB — BASIC METABOLIC PANEL
Anion gap: 9 (ref 5–15)
BUN: 40 mg/dL — ABNORMAL HIGH (ref 6–20)
CO2: 22 mmol/L (ref 22–32)
Calcium: 9 mg/dL (ref 8.9–10.3)
Chloride: 108 mmol/L (ref 98–111)
Creatinine, Ser: 3.97 mg/dL — ABNORMAL HIGH (ref 0.61–1.24)
GFR calc Af Amer: 18 mL/min — ABNORMAL LOW (ref >60)
GFR calc non Af Amer: 15 mL/min — ABNORMAL LOW (ref >60)
Glucose, Bld: 141 mg/dL — ABNORMAL HIGH (ref 70–99)
Potassium: 3.8 mmol/L (ref 3.5–5.1)
Sodium: 139 mmol/L (ref 135–145)

## 2019-03-20 LAB — CBC
HCT: 32 % — ABNORMAL LOW (ref 39.0–52.0)
Hemoglobin: 10 g/dL — ABNORMAL LOW (ref 13.0–17.0)
MCH: 29.7 pg (ref 26.0–34.0)
MCHC: 31.3 g/dL (ref 30.0–36.0)
MCV: 95 fL (ref 80.0–100.0)
Platelets: 214 10*3/uL (ref 150–400)
RBC: 3.37 MIL/uL — ABNORMAL LOW (ref 4.22–5.81)
RDW: 12.3 % (ref 11.5–15.5)
WBC: 5.2 10*3/uL (ref 4.0–10.5)
nRBC: 0 % (ref 0.0–0.2)

## 2019-03-20 LAB — CBG MONITORING, ED: Glucose-Capillary: 99 mg/dL (ref 70–99)

## 2019-03-20 MED ORDER — ASPIRIN 81 MG PO CHEW
324.0000 mg | CHEWABLE_TABLET | Freq: Once | ORAL | Status: AC
  Administered 2019-03-20: 243 mg via ORAL
  Filled 2019-03-20: qty 4

## 2019-03-20 MED ORDER — HYDRALAZINE HCL 50 MG PO TABS
50.0000 mg | ORAL_TABLET | Freq: Four times a day (QID) | ORAL | Status: DC | PRN
  Administered 2019-03-21 (×2): 50 mg via ORAL
  Filled 2019-03-20: qty 1
  Filled 2019-03-20: qty 2

## 2019-03-20 MED ORDER — ASPIRIN EC 81 MG PO TBEC
81.0000 mg | DELAYED_RELEASE_TABLET | Freq: Every day | ORAL | Status: DC
  Administered 2019-03-21: 81 mg via ORAL
  Filled 2019-03-20: qty 1

## 2019-03-20 MED ORDER — SODIUM CHLORIDE 0.9 % IV SOLN: 250.0000 mL | INTRAVENOUS | Status: DC | PRN

## 2019-03-20 MED ORDER — INSULIN ASPART 100 UNIT/ML ~~LOC~~ SOLN: 0.0000 [IU] | Freq: Three times a day (TID) | SUBCUTANEOUS | Status: DC

## 2019-03-20 MED ORDER — METOPROLOL TARTRATE 25 MG PO TABS
25.0000 mg | ORAL_TABLET | Freq: Two times a day (BID) | ORAL | Status: DC
  Administered 2019-03-20 – 2019-03-21 (×2): 25 mg via ORAL
  Filled 2019-03-20 (×2): qty 1

## 2019-03-20 MED ORDER — AMLODIPINE BESYLATE 5 MG PO TABS: 5.0000 mg | ORAL_TABLET | Freq: Every day | ORAL | Status: DC

## 2019-03-20 MED ORDER — NITROGLYCERIN 0.4 MG SL SUBL: 0.4000 mg | SUBLINGUAL_TABLET | SUBLINGUAL | Status: DC | PRN

## 2019-03-20 MED ORDER — CLONIDINE HCL 0.1 MG/24HR TD PTWK: 0.1000 mg | MEDICATED_PATCH | TRANSDERMAL | Status: DC

## 2019-03-20 MED ORDER — ONDANSETRON HCL 4 MG/2ML IJ SOLN: 4.0000 mg | Freq: Four times a day (QID) | INTRAMUSCULAR | Status: DC | PRN

## 2019-03-20 MED ORDER — ISOSORBIDE MONONITRATE ER 60 MG PO TB24
60.0000 mg | ORAL_TABLET | Freq: Every day | ORAL | Status: DC
  Filled 2019-03-20: qty 1

## 2019-03-20 MED ORDER — ACETAMINOPHEN 325 MG PO TABS: 650.0000 mg | ORAL_TABLET | ORAL | Status: DC | PRN

## 2019-03-20 MED ORDER — SODIUM CHLORIDE 0.9% FLUSH: 3.0000 mL | Freq: Two times a day (BID) | INTRAVENOUS | Status: DC

## 2019-03-20 MED ORDER — SODIUM CHLORIDE 0.9% FLUSH
3.0000 mL | Freq: Once | INTRAVENOUS | Status: AC
  Administered 2019-03-20: 3 mL via INTRAVENOUS

## 2019-03-20 MED ORDER — HEPARIN SODIUM (PORCINE) 5000 UNIT/ML IJ SOLN
5000.0000 [IU] | Freq: Three times a day (TID) | INTRAMUSCULAR | Status: DC
  Filled 2019-03-20: qty 1

## 2019-03-20 MED ORDER — CABERGOLINE 0.5 MG PO TABS: 0.2500 mg | ORAL_TABLET | ORAL | Status: DC

## 2019-03-20 MED ORDER — SODIUM CHLORIDE 0.9% FLUSH: 3.0000 mL | INTRAVENOUS | Status: DC | PRN

## 2019-03-20 NOTE — ED Notes (Signed)
Pt refused heparin, would like to discuss risk/benefits with MD during morning rounds.

## 2019-03-20 NOTE — H&P (Signed)
History and Physical    Roberto [name] QPY:[id number] DOB: 29-Jan-1961 DOA: 03/20/2019  PCP: Elayne Snare, MD   Patient coming from: home   Chief Complaint: Chest pain   HPI: Roberto [name] is a 58 y.o. 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  Patient reports that he was at rest today when he developed midsternal chest pain at approximately 4 PM.  He had brief episode of palpitations at one point.  He found his systolic blood pressure to be just over 200 at home and took a hydralazine.  He reports that his blood pressure has been running high recently since he was taken off lisinopril by his nephrologist after his creatinine had increased from baseline of 2.7 up to 4.  His amlodipine had also been decreased from 10 to 5 mg due to leg swelling.  He had not experienced similar symptoms previously, reported the pain to be "4/10" in intensity, nonradiating, and not associated with any nausea, diaphoresis, or shortness of breath.  He denies any recent leg swelling or tenderness.  Denies any fevers, chills, cough, or sick contacts.  ED Course: Upon arrival to the ED, patient is found to be afebrile, saturating well on room air, and hypertensive to 200/105.  EKG features sinus tachycardia with rate 103 and T wave inversions that are similar to prior.  Chest x-ray is negative for acute cardiopulmonary disease.  Chemistry panel notable for creatinine of 3.97, up from 3.61 on 03/18/2019.  CBC with normocytic anemia, hemoglobin 10.0.  High-sensitivity troponin is 34.  Patient was treated with 324 mg of aspirin and nitroglycerin in the ED.  He remains hypertensive.  ED physician discussed the case with cardiology who recommended hospitalist admission and blood pressure control.  Review of Systems:  All other systems reviewed and  apart from HPI, are negative.  Past Medical History:  Diagnosis Date  . Anemia   . Diabetes mellitus without complication ([place])   . H/O hypogonadism   . Hypertension   . Prolactinoma, benign [facility])     Past Surgical History:  Procedure Laterality Date  . [place]  . NO PAST SURGERIES       reports that he has never smoked. He has never used smokeless tobacco. He reports that he does not drink alcohol or use drugs.  Allergies  Allergen Reactions  . Contrast Media [Iodinated Diagnostic Agents] Rash    Family History  Problem Relation Age of Onset  . Diabetes Mother   . Colon polyps Mother   . Heart disease Mother   . Hypertension Mother   . Colon cancer Father   . Kidney disease Maternal Grandmother   . Colon cancer Maternal Grandfather   . Esophageal cancer Maternal Uncle   . Colon cancer Maternal Uncle      Prior to Admission medications   Medication Sig Start Date End Date Taking? Authorizing Provider  amLODipine (NORVASC) 10 MG tablet Take 5 mg by mouth daily.    Yes [provider]  aspirin EC 81 MG tablet Take 81 mg by mouth daily.   Yes [provider]  cabergoline (DOSTINEX) 0.5 MG tablet Take 0.5 tablets (0.25 mg total) by mouth 2 (two) times a week. Take 1 tablet twice a week Patient taking differently: Take 0.25 mg by mouth 2 (two) times a week. Take 0.5  tablets BID 08/16/18  Yes Elayne Snare, MD  cholecalciferol (VITAMIN D) 1000 units tablet Take 2,000 Units by mouth daily.   Yes [provider]  cloNIDine (CATAPRES - DOSED IN MG/24 HR) 0.1 mg/24hr patch Place 0.1 mg onto the skin once a week.   Yes [provider]  glipiZIDE (GLUCOTROL XL) 5 MG 24 hr tablet Take 2 tablets by mouth once daily every morning. 12/23/18  Yes Elayne Snare, MD  isosorbide mononitrate (IMDUR) 60 MG 24 hr tablet Take 60 mg by mouth daily.   Yes [provider]  Vitamin D, Ergocalciferol, (DRISDOL) 1.25 MG (50000 UT) CAPS  capsule Take once weekly for 12 weeks and then once a month Patient taking differently: Take 50,000 Units by mouth every 7 (seven) days.  09/25/18  Yes Elayne Snare, MD    Physical Exam: Vitals:   03/20/19 1844 03/20/19 2000 03/20/19 2018 03/20/19 2030  BP:  (!) 185/91 (!) 185/91 (!) 185/91  Pulse: 95 95 96 93  Resp: 19 (!) 22 18 15   Temp:      TempSrc:      SpO2: 98% 97% 99% 95%    Constitutional: NAD, calm  Eyes: PERTLA, lids and conjunctivae normal ENMT: Mucous membranes are moist. Posterior pharynx clear of any exudate or lesions.   Neck: normal, supple, no masses, no thyromegaly Respiratory: no wheezing, no crackles. Normal respiratory effort. No accessory muscle use.  Cardiovascular: S1 & S2 heard, regular rate and rhythm. No extremity edema.   Abdomen: No distension, no tenderness, soft. Bowel sounds active.   Musculoskeletal: no clubbing / cyanosis. No joint deformity upper and lower extremities.  Skin: no significant rashes, lesions, ulcers. Warm, dry, well-perfused. Neurologic: No facial asymmetry. Sensation intact. Moving all extremities.  Psychiatric: Alert and oriented to person, place, and situation. Very pleasant, cooperative.       Labs on Admission: I have personally reviewed following labs and imaging studies  CBC: Recent Labs  Lab 03/20/19 1850  WBC 5.2  HGB 10.0*  HCT 32.0*  MCV 95.0  PLT 009   Basic Metabolic Panel: Recent Labs  Lab 03/18/19 1625 03/20/19 1850  NA 140 139  K 3.8 3.8  CL 107* 108  CO2 20 22  GLUCOSE 108* 141*  BUN 40* 40*  CREATININE 3.61* 3.97*  CALCIUM 9.1 9.0   GFR: CrCl cannot be calculated (Unknown ideal weight.). Liver Function Tests: Recent Labs  Lab 03/18/19 1625  AST 17  ALT 13  ALKPHOS 90  BILITOT 0.4  PROT 6.9  ALBUMIN 3.9   No results for input(s): LIPASE, AMYLASE in the last 168 hours. No results for input(s): AMMONIA in the last 168 hours. Coagulation Profile: No results for input(s): INR, PROTIME  in the last 168 hours. Cardiac Enzymes: No results for input(s): CKTOTAL, CKMB, CKMBINDEX, TROPONINI in the last 168 hours. BNP (last 3 results) No results for input(s): PROBNP in the last 8760 hours. HbA1C: Recent Labs    03/18/19 1625  HGBA1C 6.0*   CBG: No results for input(s): GLUCAP in the last 168 hours. Lipid Profile: Recent Labs    03/18/19 1625  CHOL 112  HDL 36*  LDLCALC 60  TRIG 82  CHOLHDL 3.1   Thyroid Function Tests: No results for input(s): TSH, T4TOTAL, FREET4, T3FREE, THYROIDAB in the last 72 hours. Anemia Panel: No results for input(s): VITAMINB12, FOLATE, FERRITIN, TIBC, IRON, RETICCTPCT in the last 72 hours. Urine analysis:    Component Value Date/Time   COLORURINE YELLOW 07/18/2014 1843  APPEARANCEUR CLOUDY (A) 07/18/2014 1843   LABSPEC 1.026 07/18/2014 1843   PHURINE 6.0 07/18/2014 1843   GLUCOSEU >1000 (A) 07/18/2014 1843   HGBUR SMALL (A) 07/18/2014 1843   BILIRUBINUR NEGATIVE 07/18/2014 1843   KETONESUR 15 (A) 07/18/2014 1843   PROTEINUR 100 (A) 07/18/2014 1843   UROBILINOGEN 0.2 07/18/2014 1843   NITRITE NEGATIVE 07/18/2014 1843   LEUKOCYTESUR NEGATIVE 07/18/2014 1843   Sepsis Labs: @LABRCNTIP (procalcitonin:4,lacticidven:4) )No results found for this or any previous visit (from the past 240 hour(s)).   Radiological Exams on Admission: DG Chest 2 View  Result Date: 03/20/2019 CLINICAL DATA:  Acute onset chest pain today. Palpitations. Hypertension. EXAM: CHEST - 2 VIEW COMPARISON:  07/18/2014 FINDINGS: The heart size and mediastinal contours are within normal limits. Both lungs are clear. The visualized skeletal structures are unremarkable. IMPRESSION: Negative.  No active cardiopulmonary disease. Electronically Signed   By: Marlaine Hind M.D.   On: 03/20/2019 17:42    EKG: Independently reviewed. Non-specific T-wave abnormality, similar to prior.   Assessment/Plan   1. Chest pain  - Presents with chest pain that developed at rest  and eased off after taking hydralazine at home for elevated BP  - EKG with non-specific T-wave abnormalities that appear similar to prior EKG, CXR is unremarkable, and HS troponin 34 then 43  - He was treated with ASA 324 mg and NTG in ED and started on metoprolol  - Continue cardiac monitoring, control BP, trend troponin, continue ASA   2. Hypertensive urgency  - BP 200/100 range in ED; patient reports similar readings at home since his lisinopril was discontinued and Norvasc reduced  - He was started on metoprolol in ED, will titrate dose and continue clonidine and Norvasc   3. CKD stage IV  - SCr is 3.97 on admission, followed by nephrology, and patient reports recent increase from 2.7 to 4 that prompted discontinuation of lisinopril recently  - Renally-dose medications, monitor    4. Type II DM  - A1c was 6.0% earlier this month  - Managed with glipizide at home, held on admission  - Check CBG's and use a low-intensity SSI with Novolog as needed for now    5. Normocytic anemia  - Hgb is 10.0 on admission  - Patient denies melena or hematochezia, likely secondary to advanced CKD    DVT prophylaxis: sq heparin  Code Status: Full  Family Communication: Discussed with patient  Consults called: None, ED physician discussed case with cardiology  Admission status: Observation     Vianne Bulls, MD [facility] Pager [phone]  If 7PM-7AM, please contact night-coverage [email or web address] Password [facility]  03/20/2019, 10:23 PM

## 2019-03-20 NOTE — ED Provider Notes (Addendum)
[place] [facility] EMERGENCY DEPARTMENT Provider Note   CSN: [id number] Arrival date & time: 03/20/19  1636     History Chief Complaint  Patient presents with  . Chest Pain    Roberto [name] is a 58 y.o. male.  58 y.o male with a PMH of DM, HTN, prolactinoma, Essential HTN presents to the ED with a chief complaint of chest pressure x 2 hours. Patient was sitting down when he suddenly developed a intermittent substernal chest pressure without radiation, he then proceeded to measure his blood pressure, finding a systolic in the 932I along with a diastolic in the mid 712W.  He reports taking 81 mg of aspirin, he also took hydralazine. He also reported a headache, which was mainly right sided similar to one of in the past when he was admitted for hypertensive urgency. He reports hi BP regimen recently was changed from clonidine TID to clonidine patch, he also had lisinopril discontinued due to renal injury. He denies any shortness of breath, weakness, changes in vision or abdominal pain. Denies CAD, prior cardiology workup or recent ECHO.   The history is provided by the patient and medical records.  Chest Pain Associated symptoms: headache   Associated symptoms: no abdominal pain, no back pain, no fever, no nausea, no shortness of breath and no vomiting        Past Medical History:  Diagnosis Date  . Anemia   . Detached retina   . Diabetes mellitus without complication ([place])   . H/O hypogonadism   . Hypertension   . Prolactinoma, benign [facility])     Patient Active Problem List   Diagnosis Date Noted  . Demand ischemia ([place]) 03/21/2019  . Chest pain 03/20/2019  . CKD (chronic kidney disease), stage IV ([place]) 03/20/2019  . Normocytic anemia 03/20/2019  . Vitamin D deficiency 09/25/2018  . Diabetic nephropathy associated with type 2 diabetes mellitus ([place]) 09/25/2018  . Essential hypertension 09/25/2018  . Prolactin-secreting pituitary adenoma ([place]) 08/06/2014  .  Diabetes mellitus type II, non insulin dependent ([place]) 08/06/2014  . Pituitary macroadenoma ([place])   . Hypertensive urgency 07/18/2014  . DM (diabetes mellitus), type 2, uncontrolled ([place]) 07/18/2014    Past Surgical History:  Procedure Laterality Date  . [place]  . NO PAST SURGERIES         Family History  Problem Relation Age of Onset  . Diabetes Mother   . Colon polyps Mother   . Heart disease Mother   . Hypertension Mother   . Colon cancer Father   . Kidney disease Maternal Grandmother   . Colon cancer Maternal Grandfather   . Esophageal cancer Maternal Uncle   . Colon cancer Maternal Uncle     Social History   Tobacco Use  . Smoking status: Never Smoker  . Smokeless tobacco: Never Used  Substance Use Topics  . Alcohol use: No  . Drug use: No    Home Medications Prior to Admission medications   Medication Sig Start Date End Date Taking? Authorizing Provider  amLODipine (NORVASC) 10 MG tablet Take 5 mg by mouth at bedtime.    Yes [provider]  aspirin EC 81 MG tablet Take 81 mg by mouth daily.   Yes [provider]  cabergoline (DOSTINEX) 0.5 MG tablet Take 0.5 tablets (0.25 mg total) by mouth 2 (two) times a week. Take 1 tablet twice a week Patient taking differently: Take 0.25 mg by mouth 2 (two) times a week. Tuesday and  Friday 08/16/18  Yes Elayne Snare, MD  cholecalciferol (VITAMIN D) 1000 units tablet Take 2,000 Units by mouth daily.   Yes [provider]  cloNIDine (CATAPRES - DOSED IN MG/24 HR) 0.1 mg/24hr patch Place 0.1 mg onto the skin every Thursday.    Yes [provider]  glipiZIDE (GLUCOTROL XL) 5 MG 24 hr tablet Take 2 tablets by mouth once daily every morning. Patient taking differently: Take 10 mg by mouth daily with breakfast.  12/23/18  Yes Elayne Snare, MD  isosorbide mononitrate (IMDUR) 60 MG 24 hr tablet Take 60 mg by mouth daily.   Yes [provider]  Vitamin D, Ergocalciferol,  (DRISDOL) 1.25 MG (50000 UT) CAPS capsule Take once weekly for 12 weeks and then once a month Patient taking differently: Take 50,000 Units by mouth every Thursday.  09/25/18  Yes Elayne Snare, MD  hydrALAZINE (APRESOLINE) 50 MG tablet Take 50 mg by mouth 3 (three) times daily. 03/16/19   [provider]  metoprolol tartrate (LOPRESSOR) 25 MG tablet Take 1 tablet (25 mg total) by mouth 2 (two) times daily. 03/21/19   Dessa Phi, DO    Allergies    Contrast media [iodinated diagnostic agents]  Review of Systems   Review of Systems  Constitutional: Negative for fever.  HENT: Negative for sore throat.   Respiratory: Negative for shortness of breath.   Cardiovascular: Positive for chest pain. Negative for leg swelling.  Gastrointestinal: Negative for abdominal pain, nausea and vomiting.  Genitourinary: Negative for flank pain.  Musculoskeletal: Negative for back pain.  Skin: Negative for pallor and wound.  Neurological: Positive for headaches.  All other systems reviewed and are negative.   Physical Exam Updated Vital Signs BP (!) 177/92   Pulse 90   Temp 98.5 F (36.9 C) (Oral)   Resp 12   Ht 6' (1.829 m)   Wt 107.8 kg   SpO2 98%   BMI 32.24 kg/m   Physical Exam Vitals and nursing note reviewed.  Constitutional:      Appearance: He is well-developed.  HENT:     Head: Normocephalic and atraumatic.  Eyes:     General: No scleral icterus.    Pupils: Pupils are equal, round, and reactive to light.  Cardiovascular:     Heart sounds: Normal heart sounds.     Comments: No BL pitting edema.  Pulmonary:     Effort: Pulmonary effort is normal.     Breath sounds: Normal breath sounds. No wheezing.     Comments: Lungs are clear to auscultation without any wheezing, rhonchi, rales.  Chest:     Chest wall: No tenderness.  Abdominal:     General: Bowel sounds are normal. There is no distension.     Palpations: Abdomen is soft.     Tenderness: There is no abdominal  tenderness.  Musculoskeletal:        General: No tenderness or deformity.     Cervical back: Normal range of motion.     Right lower leg: No edema.     Left lower leg: No edema.  Skin:    General: Skin is warm and dry.  Neurological:     Mental Status: He is alert and oriented to person, place, and time.     Comments: Alert, oriented, thought content appropriate. Speech fluent without evidence of aphasia. Able to follow 2 step commands without difficulty.  Cranial Nerves:  II:  Peripheral visual fields grossly normal, pupils, round, reactive to light III,IV, VI: ptosis  not present, extra-ocular motions intact bilaterally  V,VII: smile symmetric, facial light touch sensation equal VIII: hearing grossly normal bilaterally  IX,X: midline uvula rise  XI: bilateral shoulder shrug equal and strong XII: midline tongue extension  Motor:  5/5 in upper and lower extremities bilaterally including strong and equal grip strength and dorsiflexion/plantar flexion Sensory: light touch normal in all extremities.  Cerebellar: normal finger-to-nose with bilateral upper extremities, pronator drift negative Gait: normal gait and balance      ED Results / Procedures / Treatments   Labs (all labs ordered are listed, but only abnormal results are displayed) Labs Reviewed  BASIC METABOLIC PANEL - Abnormal; Notable for the following components:      Result Value   Glucose, Bld 141 (*)    BUN 40 (*)    Creatinine, Ser 3.97 (*)    GFR calc non Af Amer 15 (*)    GFR calc Af Amer 18 (*)    All other components within normal limits  CBC - Abnormal; Notable for the following components:   RBC 3.37 (*)    Hemoglobin 10.0 (*)    HCT 32.0 (*)    All other components within normal limits  GLUCOSE, CAPILLARY - Abnormal; Notable for the following components:   Glucose-Capillary 145 (*)    All other components within normal limits  TROPONIN I (HIGH SENSITIVITY) - Abnormal; Notable for the following  components:   Troponin I (High Sensitivity) 34 (*)    All other components within normal limits  TROPONIN I (HIGH SENSITIVITY) - Abnormal; Notable for the following components:   Troponin I (High Sensitivity) 43 (*)    All other components within normal limits  TROPONIN I (HIGH SENSITIVITY) - Abnormal; Notable for the following components:   Troponin I (High Sensitivity) 50 (*)    All other components within normal limits  SARS CORONAVIRUS 2 (TAT 6-24 HRS)  HIV ANTIBODY (ROUTINE TESTING W REFLEX)  CBG MONITORING, ED    EKG EKG Interpretation  Date/Time:  Sunday March 20 2019 17:05:18 EST Ventricular Rate:  103 PR Interval:  182 QRS Duration: 74 QT Interval:  348 QTC Calculation: 455 R Axis:   -10 Text Interpretation: Sinus tachycardia Otherwise normal ECG Confirmed by Pattricia Boss [phone]) on 03/20/2019 6:39:47 PM   Radiology No results found.  Procedures .Critical Care Performed by: Janeece Fitting, PA-C Authorized by: Janeece Fitting, PA-C   Critical care provider statement:    Critical care time (minutes):  35   Critical care start time:  03/20/2019 6:00 PM   Critical care end time:  03/20/2019 6:38 PM   Critical care time was exclusive of:  Separately billable procedures and treating other patients   Critical care was necessary to treat or prevent imminent or life-threatening deterioration of the following conditions:  Cardiac failure   Critical care was time spent personally by me on the following activities:  Blood draw for specimens, development of treatment plan with patient or surrogate, discussions with consultants, evaluation of patient's response to treatment, examination of patient, obtaining history from patient or surrogate, ordering and performing treatments and interventions, ordering and review of laboratory studies, ordering and review of radiographic studies, pulse oximetry, re-evaluation of patient's condition and review of old charts   (including critical  care time)  Medications Ordered in ED Medications  sodium chloride flush (NS) 0.9 % injection 3 mL (3 mLs Intravenous Given 03/20/19 2057)  aspirin chewable tablet 324 mg (243 mg Oral Given 03/20/19 1856)  ED Course  I have reviewed the triage vital signs and the nursing notes.  Pertinent labs & imaging results that were available during my care of the patient were reviewed by me and considered in my medical decision making (see chart for details).    MDM Rules/Calculators/A&P   Patient with a PMH of HTN presents to the ED with a chief complaint of substernal chest pain which began while at rest. He reports a HA which began when the chest pain occurred, similar to his last episode which he was admitted to [facility] for hypertensive Urgency. He does report checking his BP with a systolic in the 784'O and diastolic in the 962'X, he then took ASA 81 mg along with hydralazine which did help with the chest pressure however BP remained high.  Reports his pain was improved.  He has no shortness of breath, no prior CAD history no prior history of CHF. Given ASA along with nitro 0.4 SL. CBC at his baseline. DG chest xray without any pneumonia or acute process. EKG is NSR.  BMP with elevated creatinine, worsening at 3.9.  Troponin is 34, will place call for cardiology for further recommendations.  8:16 PM Spoke Dr. Hassell Done from Cardiology who recommended admission into hospitalist service for further management of blood pressure control.   Patient's blood pressure slightly better after nitro, aspirin now 185/91.   Spoke to Dr. Myna Hidalgo who will admit patient for further blood pressure control and monitoring.    Portions of this note were generated with [employer]. Dictation errors may occur despite best attempts at proofreading.  Final Clinical Impression(s) / ED Diagnoses Final diagnoses:  Hypertensive urgency    Rx / DC Orders ED Discharge Orders         Ordered    metoprolol  tartrate (LOPRESSOR) 25 MG tablet  2 times daily     03/21/19 0820    Increase activity slowly     03/21/19 0821    Diet - low sodium heart healthy     03/21/19 5284    Discharge instructions    Comments: You were cared for by a hospitalist during your hospital stay. If you have any questions about your discharge medications or the care you received while you were in the hospital after you are discharged, you can call the unit and ask to speak with the hospitalist on call if the hospitalist that took care of you is not available. Once you are discharged, your primary care physician will handle any further medical issues. Please note that NO REFILLS for any discharge medications will be authorized once you are discharged, as it is imperative that you return to your primary care physician (or establish a relationship with a primary care physician if you do not have one) for your aftercare needs so that they can reassess your need for medications and monitor your lab values.   03/21/19 1324    Call MD for:  extreme fatigue     03/21/19 4010    Call MD for:  persistant dizziness or light-headedness     03/21/19 0821    Call MD for:  difficulty breathing, headache or visual disturbances     03/21/19 0821    Call MD for:  severe uncontrolled pain     03/21/19 0821    Call MD for:  persistant nausea and vomiting     03/21/19 0821    Call MD for:  temperature >100.4     03/21/19 2725  Janeece Fitting, PA-C 03/20/19 2036    Pattricia Boss, MD 03/21/19 1501    Janeece Fitting, PA-C 04/12/19 1659    Pattricia Boss, MD 04/15/19 [phone]

## 2019-03-20 NOTE — ED Triage Notes (Signed)
Pt presents w/non radiating mid-sternum chest tightness starting today around 1600. Pt states it felt like he was having multiple PVC's. No hx of the same, he is taking medication for HTN. Pt states he took a hydralazine for SBP >200 at home, states he doesn't have any nitro at home

## 2019-03-21 DIAGNOSIS — I160 Hypertensive urgency: Secondary | ICD-10-CM

## 2019-03-21 DIAGNOSIS — I248 Other forms of acute ischemic heart disease: Secondary | ICD-10-CM

## 2019-03-21 LAB — HIV ANTIBODY (ROUTINE TESTING W REFLEX): HIV Screen 4th Generation wRfx: NONREACTIVE

## 2019-03-21 LAB — SARS CORONAVIRUS 2 (TAT 6-24 HRS): SARS Coronavirus 2: NEGATIVE

## 2019-03-21 LAB — TROPONIN I (HIGH SENSITIVITY): Troponin I (High Sensitivity): 50 ng/L — ABNORMAL HIGH (ref <18)

## 2019-03-21 LAB — GLUCOSE, CAPILLARY: Glucose-Capillary: 145 mg/dL — ABNORMAL HIGH (ref 70–99)

## 2019-03-21 MED ORDER — METOPROLOL TARTRATE 25 MG PO TABS: 25.0000 mg | ORAL_TABLET | Freq: Two times a day (BID) | ORAL | 2 refills | Status: DC

## 2019-03-21 MED ORDER — LABETALOL HCL 5 MG/ML IV SOLN: 10.0000 mg | INTRAVENOUS | Status: DC | PRN

## 2019-03-21 NOTE — Progress Notes (Signed)
PT BP 203/81.  [facility] NP paged for PRN antihypertensive medication orders.

## 2019-03-21 NOTE — Progress Notes (Signed)
Pt BP 170/92 without intervention.  Pt resting on left side w/o c/o.

## 2019-03-21 NOTE — Discharge Summary (Signed)
Physician Discharge Summary  Roberto [name] EXB:[id number] DOB: 12-11-1960 DOA: 03/20/2019  PCP: Roberto Snare, MD  Admit date: 03/20/2019 Discharge date: 03/21/2019  Admitted From: home Disposition:  home  Recommendations for Outpatient Follow-up:  1. Follow up with PCP Dr. Dwyane [name] as scheduled 12/22 2. Follow up with Nephrology as scheduled 12/23  Discharge Condition: Stable, improved CODE STATUS: Full  Diet recommendation: Heart healthy/carb modified   Brief/Interim Summary: From H&P by Dr. Myna [name]: "Roberto [name] is a 58 y.o. 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  Patient reports that he was at rest today when he developed midsternal chest pain at approximately 4 PM.  He had brief episode of palpitations at one point.  He found his systolic blood pressure to be just over 200 at home and took a hydralazine.  He reports that his blood pressure has been running high recently since he was taken off lisinopril by his nephrologist after his creatinine had increased from baseline of 2.7 up to 4.  His amlodipine had also been decreased from 10 to 5 mg due to leg swelling.  He had not experienced similar symptoms previously, reported the pain to be "4/10" in intensity, nonradiating, and not associated with any nausea, diaphoresis, or shortness of breath.  He denies any recent leg swelling or tenderness.  Denies any fevers, chills, cough, or sick contacts.  ED Course: Upon arrival to the ED, patient is found to be afebrile, saturating well on room air, and hypertensive to 200/105.  EKG features sinus tachycardia with rate 103 and T wave inversions that are similar to prior.  Chest x-ray is negative for acute cardiopulmonary disease.  Chemistry panel notable for creatinine of 3.97, up from 3.61 on 03/18/2019.  CBC with  normocytic anemia, hemoglobin 10.0.  High-sensitivity troponin is 34.  Patient was treated with 324 mg of aspirin and nitroglycerin in the ED.  He remains hypertensive.  ED physician discussed the case with cardiology who recommended hospitalist admission and blood pressure control."  Patient was resumed on his home medications including amlodipine, Catapres patch.  He was started on metoprolol p.o. with improvement in his blood pressure.  Troponin was trended with flat trend, no EKG changes noted compared to previous.  On day of discharge, patient had no complaints of chest pain or shortness of breath.  He has appropriate follow-up as an outpatient with PCP as well as nephrology this week and feels comfortable going home to continue blood pressure titration with his physicians.   Discharge Diagnoses:  Principal Problem:   Hypertensive urgency Active Problems:   Diabetes mellitus type II, non insulin dependent (HCC)   Chest pain   CKD (chronic kidney disease), stage IV (HCC)   Normocytic anemia   Demand ischemia [facility])   Discharge Instructions  Discharge Instructions    Call MD for:  difficulty breathing, headache or visual disturbances   Complete by: As directed    Call MD for:  extreme fatigue   Complete by: As directed    Call MD for:  persistant dizziness or light-headedness   Complete by: As directed    Call MD for:  persistant nausea and vomiting   Complete by: As directed    Call MD for:  severe uncontrolled pain   Complete by: As directed    Call MD for:  temperature >100.4   Complete  by: As directed    Diet - low sodium heart healthy   Complete by: As directed    Discharge instructions   Complete by: As directed    You were cared for by a hospitalist during your hospital stay. If you have any questions about your discharge medications or the care you received while you were in the hospital after you are discharged, you can call the unit and ask to speak with the hospitalist  on call if the hospitalist that took care of you is not available. Once you are discharged, your primary care physician will handle any further medical issues. Please note that NO REFILLS for any discharge medications will be authorized once you are discharged, as it is imperative that you return to your primary care physician (or establish a relationship with a primary care physician if you do not have one) for your aftercare needs so that they can reassess your need for medications and monitor your lab values.   Increase activity slowly   Complete by: As directed      Allergies as of 03/21/2019      Reactions   Contrast Media [iodinated Diagnostic Agents] Rash      Medication List    TAKE these medications   amLODipine 10 MG tablet Commonly known as: NORVASC Take 5 mg by mouth daily. Notes to patient: Lowers blood pressure  Decreases chest pain    aspirin EC 81 MG tablet Take 81 mg by mouth daily. Notes to patient: Prevents clotting    cabergoline 0.5 MG tablet Commonly known as: DOSTINEX Take 0.5 tablets (0.25 mg total) by mouth 2 (two) times a week. Take 1 tablet twice a week What changed: additional instructions Notes to patient: Decreases production of prolactin    cholecalciferol 1000 units tablet Commonly known as: VITAMIN D Take 2,000 Units by mouth daily. Notes to patient: Supplement    cloNIDine 0.1 mg/24hr patch Commonly known as: CATAPRES - Dosed in mg/24 hr Place 0.1 mg onto the skin once a week. Notes to patient: Lowers blood pressure    glipiZIDE 5 MG 24 hr tablet Commonly known as: GLUCOTROL XL Take 2 tablets by mouth once daily every morning. Notes to patient: Controls blood sugar   isosorbide mononitrate 60 MG 24 hr tablet Commonly known as: IMDUR Take 60 mg by mouth daily. Notes to patient: Lowers blood pressure  Increases blood flow to the heart Long acting nitrate   metoprolol tartrate 25 MG tablet Commonly known as: LOPRESSOR Take 1 tablet  (25 mg total) by mouth 2 (two) times daily. Notes to patient: Decreases work of the heart Lowers blood pressure and heart rate Decreases force of contraction of heart muscle   Vitamin D (Ergocalciferol) 1.25 MG (50000 UT) Caps capsule Commonly known as: DRISDOL Take once weekly for 12 weeks and then once a month What changed:   how much to take  how to take this  when to take this  additional instructions Notes to patient: Supplement       Follow-up Information    Roberto Snare, MD. Go on 03/22/2019.   Specialty: Endocrinology Contact information: [place] [address] [phone]        [place], [place]. Go on 03/23/2019.   Contact information: [place] [address] [phone]          Allergies  Allergen Reactions  . Contrast Media [Iodinated Diagnostic Agents] Rash    Consultations:  None   Procedures/Studies:  DG Chest 2 View  Result Date: 03/20/2019 CLINICAL DATA:  Acute onset chest pain today. Palpitations. Hypertension. EXAM: CHEST - 2 VIEW COMPARISON:  07/18/2014 FINDINGS: The heart size and mediastinal contours are within normal limits. Both lungs are clear. The visualized skeletal structures are unremarkable. IMPRESSION: Negative.  No active cardiopulmonary disease. Electronically Signed   By: Marlaine Hind M.D.   On: 03/20/2019 17:42       Discharge Exam: Vitals:   03/21/19 0630 03/21/19 0747  BP: (!) 164/74 (!) 177/92  Pulse:  90  Resp:  12  Temp:  98.5 F (36.9 C)  SpO2:       General: Pt is alert, awake, not in acute distress Cardiovascular: RRR, S1/S2 +, no edema Respiratory: CTA bilaterally, no wheezing, no rhonchi, no respiratory distress, no conversational dyspnea  Abdominal: Soft, NT, ND, bowel sounds + Extremities: no edema, no cyanosis Psych: Normal mood and affect, stable judgement and insight     The results of significant diagnostics from this hospitalization (including  imaging, microbiology, ancillary and laboratory) are listed below for reference.     Microbiology: Recent Results (from the past 240 hour(s))  SARS CORONAVIRUS 2 (TAT 6-24 HRS) Nasopharyngeal Nasopharyngeal Swab     Status: None   Collection Time: 03/20/19  9:43 PM   Specimen: Nasopharyngeal Swab  Result Value Ref Range Status   SARS Coronavirus 2 NEGATIVE NEGATIVE Final    Comment: (NOTE) SARS-CoV-2 target nucleic acids are NOT DETECTED. The SARS-CoV-2 RNA is generally detectable in upper and lower respiratory specimens during the acute phase of infection. Negative results do not preclude SARS-CoV-2 infection, do not rule out co-infections with other pathogens, and should not be used as the sole basis for treatment or other patient management decisions. Negative results must be combined with clinical observations, patient history, and epidemiological information. The expected result is Negative. Fact Sheet for Patients: SugarRoll.be Fact Sheet for Healthcare Providers: https://www.woods-mathews.com/ This test is not yet approved or cleared by the [place] FDA and  has been authorized for detection and/or diagnosis of SARS-CoV-2 by FDA under an Emergency Use Authorization (EUA). This EUA will remain  in effect (meaning this test can be used) for the duration of the COVID-19 declaration under Section 56 4(b)(1) of the Act, 21 U.S.C. section 360bbb-3(b)(1), unless the authorization is terminated or revoked sooner. Performed at [facility], [place] [address] [place], [place] [address]      Labs: BNP (last 3 results) No results for input(s): BNP in the last 8760 hours. Basic Metabolic Panel: Recent Labs  Lab 03/18/19 1625 03/20/19 1850  NA 140 139  K 3.8 3.8  CL 107* 108  CO2 20 22  GLUCOSE 108* 141*  BUN 40* 40*  CREATININE 3.61* 3.97*  CALCIUM 9.1 9.0   Liver Function Tests: Recent Labs  Lab 03/18/19 1625  AST 17   ALT 13  ALKPHOS 90  BILITOT 0.4  PROT 6.9  ALBUMIN 3.9   No results for input(s): LIPASE, AMYLASE in the last 168 hours. No results for input(s): AMMONIA in the last 168 hours. CBC: Recent Labs  Lab 03/20/19 1850  WBC 5.2  HGB 10.0*  HCT 32.0*  MCV 95.0  PLT 214   Cardiac Enzymes: No results for input(s): CKTOTAL, CKMB, CKMBINDEX, TROPONINI in the last 168 hours. BNP: Invalid input(s): POCBNP CBG: Recent Labs  Lab 03/20/19 2230 03/21/19 0746  GLUCAP 99 145*   D-Dimer No results for input(s): DDIMER in the last 72 hours.  Hgb A1c Recent Labs    03/18/19 1625  HGBA1C 6.0*   Lipid Profile Recent Labs    03/18/19 1625  CHOL 112  HDL 36*  LDLCALC 60  TRIG 82  CHOLHDL 3.1   Thyroid function studies No results for input(s): TSH, T4TOTAL, T3FREE, THYROIDAB in the last 72 hours.  Invalid input(s): FREET3 Anemia work up No results for input(s): VITAMINB12, FOLATE, FERRITIN, TIBC, IRON, RETICCTPCT in the last 72 hours. Urinalysis    Component Value Date/Time   COLORURINE YELLOW 07/18/2014 1843   APPEARANCEUR CLOUDY (A) 07/18/2014 1843   LABSPEC 1.026 07/18/2014 1843   PHURINE 6.0 07/18/2014 1843   GLUCOSEU >1000 (A) 07/18/2014 1843   HGBUR SMALL (A) 07/18/2014 1843   BILIRUBINUR NEGATIVE 07/18/2014 1843   KETONESUR 15 (A) 07/18/2014 1843   PROTEINUR 100 (A) 07/18/2014 1843   UROBILINOGEN 0.2 07/18/2014 1843   NITRITE NEGATIVE 07/18/2014 1843   LEUKOCYTESUR NEGATIVE 07/18/2014 1843   Sepsis Labs Invalid input(s): PROCALCITONIN,  WBC,  LACTICIDVEN Microbiology Recent Results (from the past 240 hour(s))  SARS CORONAVIRUS 2 (TAT 6-24 HRS) Nasopharyngeal Nasopharyngeal Swab     Status: None   Collection Time: 03/20/19  9:43 PM   Specimen: Nasopharyngeal Swab  Result Value Ref Range Status   SARS Coronavirus 2 NEGATIVE NEGATIVE Final    Comment: (NOTE) SARS-CoV-2 target nucleic acids are NOT DETECTED. The SARS-CoV-2 RNA is generally detectable in upper  and lower respiratory specimens during the acute phase of infection. Negative results do not preclude SARS-CoV-2 infection, do not rule out co-infections with other pathogens, and should not be used as the sole basis for treatment or other patient management decisions. Negative results must be combined with clinical observations, patient history, and epidemiological information. The expected result is Negative. Fact Sheet for Patients: SugarRoll.be Fact Sheet for Healthcare Providers: https://www.woods-mathews.com/ This test is not yet approved or cleared by the [place] FDA and  has been authorized for detection and/or diagnosis of SARS-CoV-2 by FDA under an Emergency Use Authorization (EUA). This EUA will remain  in effect (meaning this test can be used) for the duration of the COVID-19 declaration under Section 56 4(b)(1) of the Act, 21 U.S.C. section 360bbb-3(b)(1), unless the authorization is terminated or revoked sooner. Performed at [facility], [place] [address], [place], [place] [address]      Patient was seen and examined on the day of discharge and was found to be in stable condition. Time coordinating discharge: 40 minutes including assessment and coordination of care, as well as examination of the patient.   SIGNED:  Dessa Phi, DO [facility] 03/21/2019, 9:35 AM

## 2019-03-22 ENCOUNTER — Ambulatory Visit: Payer: BC Managed Care – PPO | Admitting: Endocrinology

## 2019-03-26 ENCOUNTER — Emergency Department (HOSPITAL_COMMUNITY)
Admission: EM | Admit: 2019-03-26 | Discharge: 2019-03-27 | Disposition: A | Discharge status: Home / Self Care | Payer: BC Managed Care – PPO | Attending: Emergency Medicine | Admitting: Emergency Medicine

## 2019-03-26 ENCOUNTER — Emergency Department (HOSPITAL_COMMUNITY): Payer: BC Managed Care – PPO

## 2019-03-26 ENCOUNTER — Encounter (HOSPITAL_COMMUNITY): Payer: Self-pay | Admitting: Emergency Medicine

## 2019-03-26 ENCOUNTER — Other Ambulatory Visit: Payer: Self-pay

## 2019-03-26 DIAGNOSIS — Z79899 Other long term (current) drug therapy: Secondary | ICD-10-CM | POA: Insufficient documentation

## 2019-03-26 DIAGNOSIS — H547 Unspecified visual loss: Secondary | ICD-10-CM | POA: Diagnosis not present

## 2019-03-26 DIAGNOSIS — Z7984 Long term (current) use of oral hypoglycemic drugs: Secondary | ICD-10-CM | POA: Insufficient documentation

## 2019-03-26 DIAGNOSIS — H53413 Scotoma involving central area, bilateral: Secondary | ICD-10-CM | POA: Insufficient documentation

## 2019-03-26 DIAGNOSIS — R519 Headache, unspecified: Secondary | ICD-10-CM | POA: Diagnosis not present

## 2019-03-26 DIAGNOSIS — R531 Weakness: Secondary | ICD-10-CM | POA: Diagnosis not present

## 2019-03-26 DIAGNOSIS — I129 Hypertensive chronic kidney disease with stage 1 through stage 4 chronic kidney disease, or unspecified chronic kidney disease: Secondary | ICD-10-CM | POA: Insufficient documentation

## 2019-03-26 DIAGNOSIS — Z7982 Long term (current) use of aspirin: Secondary | ICD-10-CM | POA: Diagnosis not present

## 2019-03-26 DIAGNOSIS — N184 Chronic kidney disease, stage 4 (severe): Secondary | ICD-10-CM | POA: Diagnosis not present

## 2019-03-26 DIAGNOSIS — E1122 Type 2 diabetes mellitus with diabetic chronic kidney disease: Secondary | ICD-10-CM | POA: Insufficient documentation

## 2019-03-26 DIAGNOSIS — I10 Essential (primary) hypertension: Secondary | ICD-10-CM | POA: Diagnosis not present

## 2019-03-26 DIAGNOSIS — R29810 Facial weakness: Secondary | ICD-10-CM | POA: Diagnosis not present

## 2019-03-26 HISTORY — DX: Serous retinal detachment, unspecified eye: H33.20

## 2019-03-26 LAB — CBC
HCT: 31.9 % — ABNORMAL LOW (ref 39.0–52.0)
Hemoglobin: 10 g/dL — ABNORMAL LOW (ref 13.0–17.0)
MCH: 29.8 pg (ref 26.0–34.0)
MCHC: 31.3 g/dL (ref 30.0–36.0)
MCV: 94.9 fL (ref 80.0–100.0)
Platelets: 242 10*3/uL (ref 150–400)
RBC: 3.36 MIL/uL — ABNORMAL LOW (ref 4.22–5.81)
RDW: 12.5 % (ref 11.5–15.5)
WBC: 6.4 10*3/uL (ref 4.0–10.5)
nRBC: 0 % (ref 0.0–0.2)

## 2019-03-26 LAB — COMPREHENSIVE METABOLIC PANEL
ALT: 20 U/L (ref 0–44)
AST: 22 U/L (ref 15–41)
Albumin: 3.6 g/dL (ref 3.5–5.0)
Alkaline Phosphatase: 73 U/L (ref 38–126)
Anion gap: 10 (ref 5–15)
BUN: 53 mg/dL — ABNORMAL HIGH (ref 6–20)
CO2: 21 mmol/L — ABNORMAL LOW (ref 22–32)
Calcium: 9.1 mg/dL (ref 8.9–10.3)
Chloride: 107 mmol/L (ref 98–111)
Creatinine, Ser: 3.61 mg/dL — ABNORMAL HIGH (ref 0.61–1.24)
GFR calc Af Amer: 20 mL/min — ABNORMAL LOW (ref >60)
GFR calc non Af Amer: 17 mL/min — ABNORMAL LOW (ref >60)
Glucose, Bld: 140 mg/dL — ABNORMAL HIGH (ref 70–99)
Potassium: 4.4 mmol/L (ref 3.5–5.1)
Sodium: 138 mmol/L (ref 135–145)
Total Bilirubin: <0.1 mg/dL — ABNORMAL LOW (ref 0.3–1.2)
Total Protein: 7.2 g/dL (ref 6.5–8.1)

## 2019-03-26 LAB — PROTIME-INR
INR: 1.1 (ref 0.8–1.2)
Prothrombin Time: 13.8 s (ref 11.4–15.2)

## 2019-03-26 LAB — DIFFERENTIAL
Abs Immature Granulocytes: 0.02 10*3/uL (ref 0.00–0.07)
Basophils Absolute: 0 10*3/uL (ref 0.0–0.1)
Basophils Relative: 0 %
Eosinophils Absolute: 0.2 10*3/uL (ref 0.0–0.5)
Eosinophils Relative: 3 %
Immature Granulocytes: 0 %
Lymphocytes Relative: 17 %
Lymphs Abs: 1.1 10*3/uL (ref 0.7–4.0)
Monocytes Absolute: 0.5 10*3/uL (ref 0.1–1.0)
Monocytes Relative: 7 %
Neutro Abs: 4.6 10*3/uL (ref 1.7–7.7)
Neutrophils Relative %: 73 %

## 2019-03-26 LAB — APTT: aPTT: 31 seconds (ref 24–36)

## 2019-03-26 NOTE — ED Triage Notes (Addendum)
Pt to triage via [facility] from home.  Pt had acute onset of vision loss (went black) in both eyes at 1630.  Since then vision has improved but states he can't see light.    No other neuro deficits.  States he feels off balance but gait was steady with EMS.  CBG 104.  Mild headache to R posterior head.  No arm drift.

## 2019-03-27 DIAGNOSIS — I10 Essential (primary) hypertension: Secondary | ICD-10-CM | POA: Diagnosis not present

## 2019-03-27 DIAGNOSIS — H53413 Scotoma involving central area, bilateral: Secondary | ICD-10-CM

## 2019-03-27 DIAGNOSIS — H547 Unspecified visual loss: Secondary | ICD-10-CM | POA: Diagnosis not present

## 2019-03-27 LAB — CBG MONITORING, ED: Glucose-Capillary: 119 mg/dL — ABNORMAL HIGH (ref 70–99)

## 2019-03-27 MED ORDER — AMLODIPINE BESYLATE 5 MG PO TABS: 5.0000 mg | ORAL_TABLET | Freq: Every day | ORAL | Status: DC

## 2019-03-27 MED ORDER — ISOSORBIDE MONONITRATE ER 30 MG PO TB24
60.0000 mg | ORAL_TABLET | Freq: Every day | ORAL | Status: DC
  Administered 2019-03-27: 60 mg via ORAL
  Filled 2019-03-27: qty 2

## 2019-03-27 MED ORDER — GLIPIZIDE ER 10 MG PO TB24
10.0000 mg | ORAL_TABLET | Freq: Every day | ORAL | Status: DC
  Administered 2019-03-27: 10 mg via ORAL
  Filled 2019-03-27: qty 1

## 2019-03-27 MED ORDER — HYDRALAZINE HCL 25 MG PO TABS
50.0000 mg | ORAL_TABLET | Freq: Three times a day (TID) | ORAL | Status: DC
  Administered 2019-03-27: 50 mg via ORAL
  Filled 2019-03-27: qty 2

## 2019-03-27 MED ORDER — CLONIDINE HCL 0.1 MG/24HR TD PTWK: 0.1000 mg | MEDICATED_PATCH | TRANSDERMAL | Status: DC

## 2019-03-27 MED ORDER — LABETALOL HCL 5 MG/ML IV SOLN
20.0000 mg | Freq: Once | INTRAVENOUS | Status: AC
  Administered 2019-03-27: 20 mg via INTRAVENOUS
  Filled 2019-03-27: qty 4

## 2019-03-27 MED ORDER — METOPROLOL TARTRATE 25 MG PO TABS
25.0000 mg | ORAL_TABLET | Freq: Two times a day (BID) | ORAL | Status: DC
  Administered 2019-03-27: 25 mg via ORAL
  Filled 2019-03-27: qty 1

## 2019-03-27 NOTE — Discharge Instructions (Signed)
You are seen in the ER for vision loss. Our neurologist suspects that it is possible you could have had a stroke.  They recommend that you get MRI for neurologic work-up.  You have requested outpatient follow-up with neurologist, understanding that you are at risk for possible progression of your disease.  Please follow-up with neurologist as requested as soon as possible. Please follow-up with your eye specialist as soon as possible.  Return to the ER immediately if you start having worsening of your symptoms or to start developing any new neurologic symptoms such as slurred speech, balance issues, one-sided weakness or numbness.

## 2019-03-27 NOTE — ED Provider Notes (Signed)
[facility] EMERGENCY DEPARTMENT Provider Note   CSN: [id number] Arrival date & time: 03/26/19  1730     History Chief Complaint  Patient presents with  . Loss of Vision    Roberto [name] is a 58 y.o. male.  HPI    Mr. Roberto [name] is a 58 year old male with history of diabetes, hypertension, detached retina who comes into the ER with chief complaint of loss of vision.  Patient reports that about 4:30 PM he had a 30-minute episode of binocular vision loss.  His symptoms returned to baseline normal within 30 minutes.  He decided to come to the ER and while in the waiting room his vision started getting blurry again.  Patient is seeing an ophthalmologist at [facility].  He denies any new headaches, numbness, tingling, focal weakness, slurred speech.  Past Medical History:  Diagnosis Date  . Anemia   . Detached retina   . Diabetes mellitus without complication ([place])   . H/O hypogonadism   . Hypertension   . Prolactinoma, benign [facility])     Patient Active Problem List   Diagnosis Date Noted  . Demand ischemia ([place]) 03/21/2019  . Chest pain 03/20/2019  . CKD (chronic kidney disease), stage IV ([place]) 03/20/2019  . Normocytic anemia 03/20/2019  . Vitamin D deficiency 09/25/2018  . Diabetic nephropathy associated with type 2 diabetes mellitus ([place]) 09/25/2018  . Essential hypertension 09/25/2018  . Prolactin-secreting pituitary adenoma ([place]) 08/06/2014  . Diabetes mellitus type II, non insulin dependent ([place]) 08/06/2014  . Pituitary macroadenoma ([place])   . Hypertensive urgency 07/18/2014  . DM (diabetes mellitus), type 2, uncontrolled ([place]) 07/18/2014    Past Surgical History:  Procedure Laterality Date  . [place]  . NO PAST SURGERIES         Family History  Problem Relation Age of Onset  . Diabetes Mother   . Colon polyps Mother   . Heart disease Mother   . Hypertension Mother   . Colon cancer Father   . Kidney disease Maternal  Grandmother   . Colon cancer Maternal Grandfather   . Esophageal cancer Maternal Uncle   . Colon cancer Maternal Uncle     Social History   Tobacco Use  . Smoking status: Never Smoker  . Smokeless tobacco: Never Used  Substance Use Topics  . Alcohol use: No  . Drug use: No    Home Medications Prior to Admission medications   Medication Sig Start Date End Date Taking? Authorizing Provider  amLODipine (NORVASC) 10 MG tablet Take 5 mg by mouth at bedtime.    Yes [provider]  aspirin EC 81 MG tablet Take 81 mg by mouth daily.   Yes [provider]  cabergoline (DOSTINEX) 0.5 MG tablet Take 0.5 tablets (0.25 mg total) by mouth 2 (two) times a week. Take 1 tablet twice a week Patient taking differently: Take 0.25 mg by mouth 2 (two) times a week. Tuesday and Friday 08/16/18  Yes Elayne Snare, MD  cholecalciferol (VITAMIN D) 1000 units tablet Take 2,000 Units by mouth daily.   Yes [provider]  cloNIDine (CATAPRES - DOSED IN MG/24 HR) 0.1 mg/24hr patch Place 0.1 mg onto the skin every Thursday.    Yes [provider]  glipiZIDE (GLUCOTROL XL) 5 MG 24 hr tablet Take 2 tablets by mouth once daily every morning. Patient taking differently: Take 10 mg by mouth daily with breakfast.  12/23/18  Yes Elayne Snare, MD  hydrALAZINE (APRESOLINE)  50 MG tablet Take 50 mg by mouth 3 (three) times daily. 03/16/19  Yes [provider]  isosorbide mononitrate (IMDUR) 60 MG 24 hr tablet Take 60 mg by mouth daily.   Yes [provider]  metoprolol tartrate (LOPRESSOR) 25 MG tablet Take 1 tablet (25 mg total) by mouth 2 (two) times daily. 03/21/19  Yes Dessa Phi, DO  Vitamin D, Ergocalciferol, (DRISDOL) 1.25 MG (50000 UT) CAPS capsule Take once weekly for 12 weeks and then once a month Patient taking differently: Take 50,000 Units by mouth every Thursday.  09/25/18  Yes Elayne Snare, MD    Allergies    Contrast media [iodinated diagnostic  agents]  Review of Systems   Review of Systems  Constitutional: Positive for activity change.  Eyes: Positive for visual disturbance.  Respiratory: Negative for shortness of breath.   Cardiovascular: Negative for chest pain.  Gastrointestinal: Negative for nausea and vomiting.  Neurological: Negative for dizziness, syncope, weakness, light-headedness and headaches.  All other systems reviewed and are negative.   Physical Exam Updated Vital Signs BP (!) 172/92 (BP Location: Right Arm)   Pulse 76   Temp 97.9 F (36.6 C) (Oral)   Resp 12   SpO2 99%   Physical Exam Vitals and nursing note reviewed.  Constitutional:      Appearance: He is well-developed.  HENT:     Head: Atraumatic.  Eyes:     Pupils: Pupils are equal, round, and reactive to light.     Comments: Funduscopic exam of the right eye did not reveal retinal arteries or fundus. Funduscopic exam of the left eye revealed no retinal hemorrhage, I could not visualize the macula.  Patient has tunneled vision.  He has very poor peripheral vision which he states is at baseline.  Cardiovascular:     Rate and Rhythm: Normal rate.  Pulmonary:     Effort: Pulmonary effort is normal.  Musculoskeletal:     Cervical back: Neck supple.  Skin:    General: Skin is warm.  Neurological:     Mental Status: He is alert and oriented to person, place, and time.     Cranial Nerves: No cranial nerve deficit.     Sensory: No sensory deficit.     Motor: No weakness.     Coordination: Coordination normal.     ED Results / Procedures / Treatments   Labs (all labs ordered are listed, but only abnormal results are displayed) Labs Reviewed  CBC - Abnormal; Notable for the following components:      Result Value   RBC 3.36 (*)    Hemoglobin 10.0 (*)    HCT 31.9 (*)    All other components within normal limits  COMPREHENSIVE METABOLIC PANEL - Abnormal; Notable for the following components:   CO2 21 (*)    Glucose, Bld 140 (*)     BUN 53 (*)    Creatinine, Ser 3.61 (*)    Total Bilirubin <0.1 (*)    GFR calc non Af Amer 17 (*)    GFR calc Af Amer 20 (*)    All other components within normal limits  PROTIME-INR  APTT  DIFFERENTIAL    EKG EKG Interpretation  Date/Time:  Saturday March 26 2019 17:49:15 EST Ventricular Rate:  73 PR Interval:  158 QRS Duration: 80 QT Interval:  398 QTC Calculation: 438 R Axis:   -16 Text Interpretation: Normal sinus rhythm T wave abnormality, consider lateral ischemia Abnormal ECG No acute changes No significant change since  last tracing Confirmed by Varney Biles [phone]) on 03/27/2019 6:57:46 AM   Radiology CT HEAD WO CONTRAST  Result Date: 03/26/2019 CLINICAL DATA:  Occipital pain with transient bilateral blindness today. Patient indicates left ocular implant 4 years ago and silicone in right eye. EXAM: CT HEAD WITHOUT CONTRAST TECHNIQUE: Contiguous axial images were obtained from the base of the skull through the vertex without intravenous contrast. COMPARISON:  CT head 07/18/2014.  MRI brain 05/12/2015. FINDINGS: Brain: There is no evidence of acute intracranial hemorrhage, mass lesion, brain edema or extra-axial fluid collection. The ventricles and subarachnoid spaces are appropriately sized for age. There is no CT evidence of acute cortical infarction. Mild chronic small vessel ischemic changes in the periventricular white matter. Vascular: Intracranial vascular calcifications. No hyperdense vessel identified. Skull: Negative for fracture or focal lesion. Sinuses/Orbits: Mild mucosal thickening in the maxillary sinuses. The visualized paranasal sinuses, mastoid air cells and middle ears are otherwise clear. Interval bilateral globe surgery with lens removal on the left. There is high-density material anteriorly in the right globe which may relate to the reported intra-ocular silicone gel, although vitreous hemorrhage cannot be excluded. Other: None. IMPRESSION: 1. Interval  bilateral globe surgery with high density material anteriorly in the right globe which may relate to the reported intra-ocular silicone gel. Cannot exclude vitreous hemorrhage. Correlation with prior surgical history and ophthalmology examination recommended. 2. No acute intracranial findings. Mild chronic small vessel ischemic changes. Electronically Signed   By: Richardean Sale M.D.   On: 03/26/2019 18:40    Procedures Procedures (including critical care time)  Medications Ordered in ED Medications  hydrALAZINE (APRESOLINE) tablet 50 mg (50 mg Oral Given 03/27/19 1093)  metoprolol tartrate (LOPRESSOR) tablet 25 mg (25 mg Oral Given 03/27/19 0633)  amLODipine (NORVASC) tablet 5 mg (has no administration in time range)  glipiZIDE (GLUCOTROL XL) 24 hr tablet 10 mg (has no administration in time range)  isosorbide mononitrate (IMDUR) 24 hr tablet 60 mg (has no administration in time range)  cloNIDine (CATAPRES - Dosed in mg/24 hr) patch 0.1 mg (has no administration in time range)  labetalol (NORMODYNE) injection 20 mg (20 mg Intravenous Given 03/27/19 0747)    ED Course  I have reviewed the triage vital signs and the nursing notes.  Pertinent labs & imaging results that were available during my care of the patient were reviewed by me and considered in my medical decision making (see chart for details).    MDM Rules/Calculators/A&P                      58 year old male comes to the ER with chief complaint of vision loss.  He had a 30-minute episode of complete vision loss yesterday.  While in the ER his vision has gotten blurry again.  He has history of retinal detachment.  He is diabetic and has history of hypertension as well.  No history of strokes.  Neuro exam is unrevealing except for the tunneling of the vision and loss of peripheral vision, which patient states is not new.  Funduscopic exam were suboptimal.  Differential diagnosis includes CRAO, CRVO, hypertensive emergency,  ophthalmic stroke.   MRI was ordered however patient has declined.  Neurology was consulted.  Neurology recommends admission to the hospital for full stroke work-up.  Patient has declined admission.  Neurology had airway type conversation after which I went in to reassess the patient.  Patient wants to leave against medical advice and he prefers outpatient neurologic evaluation.  He  was to have his eye doctors at [facility] continue to take care of his eye.  Patient does not want MRI, he prefers getting delayed CT scan.  He understands that the MRI is not contraindicated with his eye procedures. Patient understands that his actions will lead to inadequate medical workup, and that he is at risk of complications of missed diagnosis, which includes morbidity and mortality.  Alternative options discussed : MRI in the ED at discharge if it is negative. Opportunity to change mind given. Discussion witnessed by neurology, patient's RN. Patient is demonstrating good capacity to make decision. Patient understands that he needs to return to the ER immediately if his symptoms get worse.  Patient is receiving IV labetalol along with all his oral home meds.  He will be set for discharge on 8:30 AM.  Final Clinical Impression(s) / ED Diagnoses Final diagnoses:  Vision loss, central, bilateral    Rx / DC Orders ED Discharge Orders    None       Varney Biles, MD 03/27/19 [phone]

## 2019-03-27 NOTE — Consult Note (Addendum)
NEURO HOSPITALIST CONSULT NOTE   Requesting physician: Dr. Kathrynn Humble  Reason for Consult: Acute binocular vision loss  History obtained from:  Patient and Chart    HPI:                                                                                                                                          Roberto [name] is an 58 y.o. male who presented to the ED yesterday via EMS from his home with acute onset of vision loss OU at 1630, which he describes as blacking out of vision in both eyes. Since then, prior to presenting to the ED, vision had improved but he stated that he could not see light. There were no other neuro deficits reported except that he felt off balance. Of note, gait was steady with EMS. CBG on arrival was 104. He complained of a mild headache to R posterior head. There was no arm drift.  On interview with Neurology, the patient states that he has a history of bilateral retinal detachments with scleral buckles placed 1 year ago. Has a lens implant OS and is scheduled for a lens implant OD in January. He states that after his vision blacked out completely, it gradually returned back to baseline over 30 minutes, first with dim vision that gradually got brighter back to his baseline. He states that subsequently, during his stay in the ED, his vision became blurred but not dim in conjunction with elevated BPs. He states that it is normal for his vision to become blurred with elevated BP.   While in the ED, labs revealed an elevated BUN of 53 and Cr of 3.61, with eGFR of 17. WBC normal, Hgb 10, platelets 242. INR 1.1. Hgb A1C is 6.   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 Patient reports that he was at rest today when he developed  midsternal chest pain at approximately 4 PM. He had brief episode of palpitations at one point. He found his systolic blood pressure to be just over 200 at home and took a hydralazine. He reports that his blood pressure has been running high recently since he was taken off lisinopril by his nephrologist after his creatinine had increased from baseline of 2.7 up to 4. His amlodipine had also been decreased from 10to36mdue to leg swelling. He had not experienced similar symptoms previously, reported the pain to be "4/10" in intensity, nonradiating, and not associated with any nausea, diaphoresis, or shortness of breath. He denies any recent leg swelling or tenderness. Denies any fevers, chills, cough, or sick contacts."  He has  no prior history of stroke or MI.   Past Medical History:  Diagnosis Date  . Anemia   . Detached retina   . Diabetes mellitus without complication ([place])   . H/O hypogonadism   . Hypertension   . Prolactinoma, benign [facility])     Past Surgical History:  Procedure Laterality Date  . [place]  . NO PAST SURGERIES      Family History  Problem Relation Age of Onset  . Diabetes Mother   . Colon polyps Mother   . Heart disease Mother   . Hypertension Mother   . Colon cancer Father   . Kidney disease Maternal Grandmother   . Colon cancer Maternal Grandfather   . Esophageal cancer Maternal Uncle   . Colon cancer Maternal Uncle             Social History:  reports that he has never smoked. He has never used smokeless tobacco. He reports that he does not drink alcohol or use drugs.  Allergies  Allergen Reactions  . Contrast Media [Iodinated Diagnostic Agents] Rash    MEDICATIONS:                                                                                                                      Current Facility-Administered Medications on File Prior to Encounter  Medication Dose Route Frequency Provider Last Rate Last Admin  . 0.9 %   sodium chloride infusion  500 mL Intravenous Continuous Armbruster, Carlota Raspberry, MD       Current Outpatient Medications on File Prior to Encounter  Medication Sig Dispense Refill  . amLODipine (NORVASC) 10 MG tablet Take 5 mg by mouth at bedtime.     Marland Kitchen aspirin EC 81 MG tablet Take 81 mg by mouth daily.    . cabergoline (DOSTINEX) 0.5 MG tablet Take 0.5 tablets (0.25 mg total) by mouth 2 (two) times a week. Take 1 tablet twice a week (Patient taking differently: Take 0.25 mg by mouth 2 (two) times a week. Tuesday and Friday) 12 tablet 2  . cholecalciferol (VITAMIN D) 1000 units tablet Take 2,000 Units by mouth daily.    . cloNIDine (CATAPRES - DOSED IN MG/24 HR) 0.1 mg/24hr patch Place 0.1 mg onto the skin every Thursday.     Marland Kitchen glipiZIDE (GLUCOTROL XL) 5 MG 24 hr tablet Take 2 tablets by mouth once daily every morning. (Patient taking differently: Take 10 mg by mouth daily with breakfast. ) 180 tablet 2  . hydrALAZINE (APRESOLINE) 50 MG tablet Take 50 mg by mouth 3 (three) times daily.    . isosorbide mononitrate (IMDUR) 60 MG 24 hr tablet Take 60 mg by mouth daily.    . metoprolol tartrate (LOPRESSOR) 25 MG tablet Take 1 tablet (25 mg total) by mouth 2 (two) times daily. 60 tablet 2  . Vitamin D, Ergocalciferol, (DRISDOL) 1.25 MG (50000 UT) CAPS capsule Take once weekly for 12 weeks and then once a month (Patient taking differently: Take  50,000 Units by mouth every Thursday. ) 12 capsule 2   ROS:                                                                                                                                       Denies limb weakness, limb numbness, ataxia, confusion or language dysfunction. Had a mild 2/10 headache during the episode of vision loss that is now resolved. Does not endorse any additional symptoms except as noted in HPI.   Blood pressure (!) 213/112, pulse 87, temperature 97.9 F (36.6 C), temperature source Oral, resp. rate 14, SpO2 97 %.  General Examination:                                                                                                       Physical Exam  HEENT-  [place]/AT   Lungs- Respirations unlabored Extremities- Pitting edema BLE  Neurological Examination Mental Status: Alert, oriented, thought content appropriate.  Speech fluent without evidence of aphasia.  Able to follow all commands without difficulty. Cranial Nerves: II: Funduscopic exam limited by 3 mm pupil on the right and 2 mm pupil on the left. Vascular markings can be seen OS. Unable to visualize retina OD. Severe visual field constriction in all 4 quadrants of each eye to confrontation testing. Central visual acuity on right is impaired but has some vision.  III,IV, VI: Horizontal EOMI with no nystagmus.  V,VII: Smile symmetric, facial temp sensation equal bilaterally VIII: hearing intact to voice IX,X: No pharyngeal dysarthria XI: Symmetric XII: Midline tongue extension Motor: Right : Upper extremity   5/5    Left:     Upper extremity   5/5  Lower extremity   5/5     Lower extremity   5/5 Sensory: Temp and light touch intact throughout, bilaterally Deep Tendon Reflexes: 2+ and symmetric throughout Cerebellar: No ataxia with FNF bilaterally  Gait: Deferred   Lab Results: Basic Metabolic Panel: Recent Labs  Lab 03/20/19 1850 03/26/19 2128  NA 139 138  K 3.8 4.4  CL 108 107  CO2 22 21*  GLUCOSE 141* 140*  BUN 40* 53*  CREATININE 3.97* 3.61*  CALCIUM 9.0 9.1    CBC: Recent Labs  Lab 03/20/19 1850 03/26/19 2128  WBC 5.2 6.4  NEUTROABS  --  4.6  HGB 10.0* 10.0*  HCT 32.0* 31.9*  MCV 95.0 94.9  PLT 214 242    Cardiac Enzymes: No results for input(s): CKTOTAL, CKMB, CKMBINDEX, TROPONINI in the last 168  hours.  Lipid Panel: No results for input(s): CHOL, TRIG, HDL, CHOLHDL, VLDL, LDLCALC in the last 168 hours.  Imaging: CT HEAD WO CONTRAST  Result Date: 03/26/2019 CLINICAL DATA:  Occipital pain with transient bilateral blindness today. Patient  indicates left ocular implant 4 years ago and silicone in right eye. EXAM: CT HEAD WITHOUT CONTRAST TECHNIQUE: Contiguous axial images were obtained from the base of the skull through the vertex without intravenous contrast. COMPARISON:  CT head 07/18/2014.  MRI brain 05/12/2015. FINDINGS: Brain: There is no evidence of acute intracranial hemorrhage, mass lesion, brain edema or extra-axial fluid collection. The ventricles and subarachnoid spaces are appropriately sized for age. There is no CT evidence of acute cortical infarction. Mild chronic small vessel ischemic changes in the periventricular white matter. Vascular: Intracranial vascular calcifications. No hyperdense vessel identified. Skull: Negative for fracture or focal lesion. Sinuses/Orbits: Mild mucosal thickening in the maxillary sinuses. The visualized paranasal sinuses, mastoid air cells and middle ears are otherwise clear. Interval bilateral globe surgery with lens removal on the left. There is high-density material anteriorly in the right globe which may relate to the reported intra-ocular silicone gel, although vitreous hemorrhage cannot be excluded. Other: None. IMPRESSION: 1. Interval bilateral globe surgery with high density material anteriorly in the right globe which may relate to the reported intra-ocular silicone gel. Cannot exclude vitreous hemorrhage. Correlation with prior surgical history and ophthalmology examination recommended. 2. No acute intracranial findings. Mild chronic small vessel ischemic changes. Electronically Signed   By: Richardean Sale M.D.   On: 03/26/2019 18:40    Assessment: 58 year old male presenting with acute binocular vision loss. Recently discharged on 12/21 after admission for evaluation and management of CP and HTN.  1. Exam reveals severe bilateral visual field constriction and decreased central visual acuity, worse on the right. No other neurological deficits noted.  2. CT head: Interval bilateral globe  surgery with high density material anteriorly in the right globe which may relate to the reported intra-ocular silicone gel. Cannot exclude vitreous hemorrhage. Correlation with prior surgical history and ophthalmology examination recommended. No acute intracranial findings. Mild chronic small vessel ischemic changes. 3. DDx for his presentation includes bilateral retinal pathology that is new relative to his baseline, as well as bilateral occipital lobe strokes.   Recommendations: 1. MRI brain. The patient currently states that he will refuse MRI brain for assessment of possible occipital lobe strokes, due to his fear that the scleral buckles may be ferromagnetic. Was not willing to wait for an Ophthalmology evaluation to assess for MRI compatibility of the scleral buckles. Also not willing to have inpatient Ophthalmology assessment for the severe peripheral visual field loss noted on exam this AM.  2. Ophthalmology evaluation. Would get a close follow up with Ophthalmology within 1 week if patient continues to refuse admission.  3. Given refusal to obtain MRI, will need repeat CT head in 48 hours to assess for possible bilateral occipital lobe stroke, which could have been missed on initial CT head. Unable to obtain CT perfusion due to CKD.  4. All of the above was discussed with the patient, who continued to state that he would prefer not to be admitted and to have work up take place as an outpatient. He acknowledged the risk of severe potential health consequences of his decision including massive stroke and death should a condition that could have been diagnosed during recommended admission be missed due to delays inherent in obtaining acute stroke and Ophthalmological evaluations as an  outpatient.   5. Continue ASA. Unable to escalate therapy without results of work up based on risk/benefit profile.  6. Also discussed with Dr. Kathrynn Humble.   Electronically signed: Dr. Kerney Elbe 03/27/2019, 7:04  AM

## 2019-03-27 NOTE — ED Notes (Signed)
Neurology at bedside.

## 2019-03-27 NOTE — ED Provider Notes (Signed)
Signout from Dr. Kathrynn Humble.  58 year old male history of hypertension here with binocular visual loss.  Currently having diminished vision.  Neurologic work-up ongoing and neuro is recommending admission for further work-up.  Patient declines admission and wants to be followed up as an outpatient.  Plan is to get his blood pressure down somewhat and if he still declines admission to have him sign out AMA. Physical Exam  BP 126/74   Pulse 76   Temp 97.9 F (36.6 C) (Oral)   Resp 13   SpO2 96%   Physical Exam  ED Course/Procedures     Procedures  MDM  Patient had received labetalol and afterwards his blood pressure came down a lot to 126/74.  He said he started experiencing some visual disturbance with that and he is wondering if his blood pressure dropped too fast and too low.  He is agreeable to stay here for a while until he sees if all of his symptoms have improved.  10:45 AM.  Patient's blood pressure is now normalized back into the 140s and 150s.  He says his vision is much improved.  He is asking to be discharged and will follow up with his neuro-ophthalmologist.  He is asking that I do not sign out AMA because he is complying with medical advice and getting the delayed CT just as an outpatient and not an inpatient.  He will return if any worsening symptoms.       Hayden Rasmussen, MD 03/27/19 1655

## 2019-03-31 DIAGNOSIS — N184 Chronic kidney disease, stage 4 (severe): Secondary | ICD-10-CM | POA: Diagnosis not present

## 2019-03-31 DIAGNOSIS — N2581 Secondary hyperparathyroidism of renal origin: Secondary | ICD-10-CM | POA: Diagnosis not present

## 2019-03-31 DIAGNOSIS — I10 Essential (primary) hypertension: Secondary | ICD-10-CM | POA: Diagnosis not present

## 2019-03-31 DIAGNOSIS — E119 Type 2 diabetes mellitus without complications: Secondary | ICD-10-CM | POA: Diagnosis not present

## 2019-04-05 DIAGNOSIS — E103313 Type 1 diabetes mellitus with moderate nonproliferative diabetic retinopathy with macular edema, bilateral: Secondary | ICD-10-CM | POA: Diagnosis not present

## 2019-04-05 DIAGNOSIS — H33003 Unspecified retinal detachment with retinal break, bilateral: Secondary | ICD-10-CM | POA: Diagnosis not present

## 2019-04-05 DIAGNOSIS — H409 Unspecified glaucoma: Secondary | ICD-10-CM | POA: Diagnosis not present

## 2019-04-05 DIAGNOSIS — H4312 Vitreous hemorrhage, left eye: Secondary | ICD-10-CM | POA: Diagnosis not present

## 2019-04-07 DIAGNOSIS — N184 Chronic kidney disease, stage 4 (severe): Secondary | ICD-10-CM | POA: Diagnosis not present

## 2019-04-07 DIAGNOSIS — I10 Essential (primary) hypertension: Secondary | ICD-10-CM | POA: Diagnosis not present

## 2019-04-08 ENCOUNTER — Ambulatory Visit: Payer: BC Managed Care – PPO | Admitting: Endocrinology

## 2019-04-08 ENCOUNTER — Other Ambulatory Visit: Payer: Self-pay

## 2019-04-11 DIAGNOSIS — Z961 Presence of intraocular lens: Secondary | ICD-10-CM | POA: Diagnosis not present

## 2019-04-11 DIAGNOSIS — E119 Type 2 diabetes mellitus without complications: Secondary | ICD-10-CM | POA: Diagnosis not present

## 2019-04-11 DIAGNOSIS — N184 Chronic kidney disease, stage 4 (severe): Secondary | ICD-10-CM | POA: Diagnosis not present

## 2019-04-11 DIAGNOSIS — H4312 Vitreous hemorrhage, left eye: Secondary | ICD-10-CM | POA: Diagnosis not present

## 2019-04-11 DIAGNOSIS — I10 Essential (primary) hypertension: Secondary | ICD-10-CM | POA: Diagnosis not present

## 2019-04-11 DIAGNOSIS — N2581 Secondary hyperparathyroidism of renal origin: Secondary | ICD-10-CM | POA: Diagnosis not present

## 2019-04-11 DIAGNOSIS — H40013 Open angle with borderline findings, low risk, bilateral: Secondary | ICD-10-CM | POA: Diagnosis not present

## 2019-04-12 ENCOUNTER — Ambulatory Visit: Payer: BC Managed Care – PPO | Admitting: Endocrinology

## 2019-04-13 ENCOUNTER — Other Ambulatory Visit: Payer: Self-pay | Admitting: Nephrology

## 2019-04-13 DIAGNOSIS — I10 Essential (primary) hypertension: Secondary | ICD-10-CM

## 2019-04-14 DIAGNOSIS — H4312 Vitreous hemorrhage, left eye: Secondary | ICD-10-CM | POA: Diagnosis not present

## 2019-04-14 DIAGNOSIS — H33003 Unspecified retinal detachment with retinal break, bilateral: Secondary | ICD-10-CM | POA: Diagnosis not present

## 2019-04-14 DIAGNOSIS — E103313 Type 1 diabetes mellitus with moderate nonproliferative diabetic retinopathy with macular edema, bilateral: Secondary | ICD-10-CM | POA: Diagnosis not present

## 2019-04-14 DIAGNOSIS — H401132 Primary open-angle glaucoma, bilateral, moderate stage: Secondary | ICD-10-CM | POA: Diagnosis not present

## 2019-04-15 ENCOUNTER — Ambulatory Visit (INDEPENDENT_AMBULATORY_CARE_PROVIDER_SITE_OTHER): Payer: BC Managed Care – PPO | Admitting: Endocrinology

## 2019-04-15 ENCOUNTER — Other Ambulatory Visit: Payer: Self-pay

## 2019-04-15 ENCOUNTER — Encounter: Payer: Self-pay | Admitting: Endocrinology

## 2019-04-15 VITALS — BP 144/70 | HR 66 | Ht 72.0 in | Wt 239.6 lb

## 2019-04-15 DIAGNOSIS — E1169 Type 2 diabetes mellitus with other specified complication: Secondary | ICD-10-CM | POA: Diagnosis not present

## 2019-04-15 DIAGNOSIS — E559 Vitamin D deficiency, unspecified: Secondary | ICD-10-CM

## 2019-04-15 DIAGNOSIS — I10 Essential (primary) hypertension: Secondary | ICD-10-CM

## 2019-04-15 DIAGNOSIS — E669 Obesity, unspecified: Secondary | ICD-10-CM

## 2019-04-15 DIAGNOSIS — D352 Benign neoplasm of pituitary gland: Secondary | ICD-10-CM

## 2019-04-15 LAB — GLUCOSE, RANDOM: Glucose, Bld: 114 mg/dL — ABNORMAL HIGH (ref 70–99)

## 2019-04-15 LAB — T4, FREE: Free T4: 1.16 ng/dL (ref 0.60–1.60)

## 2019-04-15 NOTE — Progress Notes (Signed)
Patient ID: Roberto [name], male   DOB: January 26, 1961, 59 y.o.   MRN: [id number]   Chief complaint: Follow-up of multiple endocrine problems  History of Present Illness:  PROBLEM 1: Pituitary tumor  His MRI on 07/18/14 showed a pituitary tumor with the following description: 2.3 x 2 x 1.8 cm mass centered in the sella with suprasellar extension and cavernous sinus extension greater on the left with an appearance most suggestive of pituitary macroadenoma. This impresses upon the optic chiasm  Had difficulty with peripheral vision on the left side but this has resolved He being followed by ophthalmologist periodically for this and macular edema   MRI in 2/17 showing focal area of delayed enhancement this represents a pituitary adenoma on the left.  This lesion measures 15 x 12 x 11 mm.   Has had a follow-up in 12/18 which showed the following Slightly distorted architecture of the pituitary gland with rightward deviation of the pituitary stalk and rightward positioning of the pituitary gland and the sella with T2 hyperintense material on the left which could be cystic treated tumor versus CSF.   A discrete mass with mass effect on the chiasm is not identified.   PROBLEM 2:  Hyperprolactinemia:  At baseline he had a prolactin level checked and this was markedly increased at 1312 He had symptoms of decreased libido and gynecomastia Testosterone level at baseline was low at 70  He  was started on Dostinex 0.5 mg initially half tablet twice a week and then 1 tablet twice a week on his initial consultation on 08/04/14 He is tolerating the Dostinex without side effects. Prolactin has been subsequently normal since 09/2014 until recently   He is still taking half tablet of Dostinex twice a week on Mondays and Fridays  His prolactin was last checked nonfasting on 12/18 and was higher than usual at 20.3 No recent unusual headaches  Lab Results  Component Value Date   PROLACTIN 20.3  (H) 03/18/2019   PROLACTIN 7.8 09/22/2018   PROLACTIN 3.7 (L) 12/21/2017   PROLACTIN 6.7 05/05/2017   PROLACTIN 8.1 01/30/2017   . HYPOGONADISM:   This had resolved as of 9/19  Did not complain of tiredness His last testosterone level was done late afternoon and not fasting   Lab Results  Component Value Date   TESTOSTERONE 245 (L) 03/18/2019   THYROID function: Has not had any follow-up recently, previously free T4 consistently normal  Lab Results  Component Value Date   TSH 0.404 07/18/2014   FREET4 0.90 01/30/2017   FREET4 0.80 10/07/2016   FREET4 0.84 06/06/2016     PROBLEM 3:  DIABETES type II with obesity    He was diagnosed  in 2014  and has been Previously managed by a primary care physician who he works with. On admission to the hospital in 2016 his glucose was 380 without any evidence of ketosis  Highest A1c had been 8%, subsequently in the normal range since 11/18 A1c is now 6 and about the same  He does not bring his monitor for download as before  NON-insulin hypoglycemic drugs currently used: Only glipizide ER 5 mg daily  Current management, blood sugar patterns and problems identified:  He has been told by his ophthalmologist to stop Ozempic a few months ago because of potential impact on macular edema  However he has still been able to keep his weight down  Blood sugars are not any higher  Also now taking 5 mg of glipizide ER instead  of 10 mg  Kidney function however has been worse  Before he had recent acute illness he was trying to keep walking on his treadmill regularly  Blood sugar range at home:  90-130 range, probably some readings after meals but he is not getting specific numbers  No hypoglycemia recently   Last visit with dietitian: 01/2018  Weight history:  Wt Readings from Last 3 Encounters:  04/15/19 239 lb 9.6 oz (108.7 kg)  03/21/19 237 lb 11.2 oz (107.8 kg)  09/24/18 239 lb (108.4 kg)    Lab Results  Component  Value Date   HGBA1C 6.0 (H) 03/18/2019   HGBA1C 5.8 09/22/2018   HGBA1C 6.1 12/21/2017   Lab Results  Component Value Date   MICROALBUR 90.6 (H) 10/10/2016   LDLCALC 60 03/18/2019   CREATININE 3.61 (H) 03/26/2019    OTHER active problems and review of systems    Past Medical History:  Diagnosis Date  . Anemia   . Detached retina   . Diabetes mellitus without complication ([place])   . H/O hypogonadism   . Hypertension   . Prolactinoma, benign [facility])     Past Surgical History:  Procedure Laterality Date  . [place]  . NO PAST SURGERIES      Family History  Problem Relation Age of Onset  . Diabetes Mother   . Colon polyps Mother   . Heart disease Mother   . Hypertension Mother   . Colon cancer Father   . Kidney disease Maternal Grandmother   . Colon cancer Maternal Grandfather   . Esophageal cancer Maternal Uncle   . Colon cancer Maternal Uncle     Social History:  reports that he has never smoked. He has never used smokeless tobacco. He reports that he does not drink alcohol or use drugs.  Allergies:  Allergies  Allergen Reactions  . Contrast Media [Iodinated Diagnostic Agents] Rash    Allergies as of 04/15/2019      Reactions   Contrast Media [iodinated Diagnostic Agents] Rash      Medication List       Accurate as of April 15, 2019 10:59 AM. If you have any questions, ask your nurse or doctor.        STOP taking these medications   cabergoline 0.5 MG tablet Commonly known as: DOSTINEX Stopped by: Elayne Snare, MD   cloNIDine 0.1 mg/24hr patch Commonly known as: CATAPRES - Dosed in mg/24 hr Stopped by: Elayne Snare, MD   glipiZIDE 5 MG 24 hr tablet Commonly known as: GLUCOTROL XL Stopped by: Elayne Snare, MD   hydrALAZINE 50 MG tablet Commonly known as: APRESOLINE Stopped by: Elayne Snare, MD   metoprolol tartrate 25 MG tablet Commonly known as: LOPRESSOR Stopped by: Elayne Snare, MD   Vitamin D (Ergocalciferol) 1.25 MG (50000  UNIT) Caps capsule Commonly known as: DRISDOL Stopped by: Elayne Snare, MD     TAKE these medications   amLODipine 10 MG tablet Commonly known as: NORVASC Take 5 mg by mouth at bedtime.   aspirin EC 81 MG tablet Take 81 mg by mouth daily.   cholecalciferol 1000 units tablet Commonly known as: VITAMIN D Take 2,000 Units by mouth daily.   isosorbide mononitrate 60 MG 24 hr tablet Commonly known as: IMDUR Take 60 mg by mouth daily.       LABS:  No visits with results within 1 Week(s) from this visit.  Latest known visit with results is:  Admission on 03/26/2019, Discharged on  03/27/2019  Component Date Value Ref Range Status  . Prothrombin Time 03/26/2019 13.8  11.4 - 15.2 seconds Final  . INR 03/26/2019 1.1  0.8 - 1.2 Final   Comment: (NOTE) INR goal varies based on device and disease states. Performed at [facility], [place] [address] [place], [place] [address]   . aPTT 03/26/2019 31  24 - 36 seconds Final   Performed at [facility], [place] [address], [place], [place] [address]  . WBC 03/26/2019 6.4  4.0 - 10.5 K/uL Final  . RBC 03/26/2019 3.36* 4.22 - 5.81 MIL/uL Final  . Hemoglobin 03/26/2019 10.0* 13.0 - 17.0 g/dL Final  . HCT 03/26/2019 31.9* 39.0 - 52.0 % Final  . MCV 03/26/2019 94.9  80.0 - 100.0 fL Final  . MCH 03/26/2019 29.8  26.0 - 34.0 pg Final  . MCHC 03/26/2019 31.3  30.0 - 36.0 g/dL Final  . RDW 03/26/2019 12.5  11.5 - 15.5 % Final  . Platelets 03/26/2019 242  150 - 400 K/uL Final  . nRBC 03/26/2019 0.0  0.0 - 0.2 % Final   Performed at [facility], [place] [address] [place], [place] [address]  . Neutrophils Relative % 03/26/2019 73  % Final  . Neutro Abs 03/26/2019 4.6  1.7 - 7.7 K/uL Final  . Lymphocytes Relative 03/26/2019 17  % Final  . Lymphs Abs 03/26/2019 1.1  0.7 - 4.0 K/uL Final  . Monocytes Relative 03/26/2019 7  % Final  . Monocytes Absolute 03/26/2019 0.5  0.1 - 1.0 K/uL Final  . Eosinophils Relative 03/26/2019 3  % Final   . Eosinophils Absolute 03/26/2019 0.2  0.0 - 0.5 K/uL Final  . Basophils Relative 03/26/2019 0  % Final  . Basophils Absolute 03/26/2019 0.0  0.0 - 0.1 K/uL Final  . Immature Granulocytes 03/26/2019 0  % Final  . Abs Immature Granulocytes 03/26/2019 0.02  0.00 - 0.07 K/uL Final   Performed at [facility], [place] [address] [place], [place] [address]  . Sodium 03/26/2019 138  135 - 145 mmol/L Final  . Potassium 03/26/2019 4.4  3.5 - 5.1 mmol/L Final  . Chloride 03/26/2019 107  98 - 111 mmol/L Final  . CO2 03/26/2019 21* 22 - 32 mmol/L Final  . Glucose, Bld 03/26/2019 140* 70 - 99 mg/dL Final  . BUN 03/26/2019 53* 6 - 20 mg/dL Final  . Creatinine, Ser 03/26/2019 3.61* 0.61 - 1.24 mg/dL Final  . Calcium 03/26/2019 9.1  8.9 - 10.3 mg/dL Final  . Total Protein 03/26/2019 7.2  6.5 - 8.1 g/dL Final  . Albumin 03/26/2019 3.6  3.5 - 5.0 g/dL Final  . AST 03/26/2019 22  15 - 41 U/L Final  . ALT 03/26/2019 20  0 - 44 U/L Final  . Alkaline Phosphatase 03/26/2019 73  38 - 126 U/L Final  . Total Bilirubin 03/26/2019 <0.1* 0.3 - 1.2 mg/dL Final  . GFR calc non Af Amer 03/26/2019 17* >60 mL/min Final  . GFR calc Af Amer 03/26/2019 20* >60 mL/min Final  . Anion gap 03/26/2019 10  5 - 15 Final   Performed at [facility], [place] [address], [place],  [address]  . Glucose-Capillary 03/27/2019 119* 70 - 99 mg/dL Final     REVIEW OF SYSTEMS:        Eyes: Now has vitreous hemorrhage requiring surgery,, also has had macular edema  No history of hyperlipidemia  Lab Results  Component Value Date  CHOL 112 03/18/2019   HDL 36 (L) 03/18/2019   LDLCALC 60 03/18/2019   TRIG 82 03/18/2019   CHOLHDL 3.1 03/18/2019     Hypertension: has had a long history of high blood pressure   Currently on regimen of amlodipine 10 mg, hydralazine 50  He is treated by nephrologist  Monitoring at home also, he thinks his blood pressure has been nearly normal, mostly 621 systolic  He says he had  marked increase in blood pressure after going off clonidine  NEPHROPATHY: He has persistent increase in microalbumin Has had renal biopsy Followed by nephrologist with creatinine history as follows:    Lab Results  Component Value Date   CREATININE 3.61 (H) 03/26/2019   CREATININE 3.97 (H) 03/20/2019   CREATININE 3.61 (H) 03/18/2019   CREATININE 2.95 (H) 09/22/2018    No symptoms of NEUROPATHY: Last foot exam 08/2018  Last eye exam 05/2017   VITAMIN D deficiency: This was done as a screening and at baseline was low at 5.8.   Has been on 3000U supplements   Reportedly PTH is only mildly increased above normal and also serum calcium is normal  Lab Results  Component Value Date   VD25OH 42.6 03/18/2019   VD25OH 12.24 (L) 09/22/2018   VD25OH 24.07 (L) 12/21/2017     Lab Results  Component Value Date   CALCIUM 9.1 03/26/2019     PHYSICAL EXAM:  BP (!) 144/70 (BP Location: Left Arm, Patient Position: Sitting, Cuff Size: Large)   Pulse 66   Ht 6' (1.829 m)   Wt 239 lb 9.6 oz (108.7 kg)   SpO2 97%   BMI 32.50 kg/m      ASSESSMENT/PLAN:   DIABETES type II, non--insulin-requiring with mild obesity  See history of present illness for detailed discussion of current diabetes management, blood sugar patterns and problems identified  His A1c has improved further and now is 6%  Requiring minimal treatment now with only glipizide Kidney function is worse and he is taking 25 mg glipizide ER Previously benefited from [place] but this was stopped by ophthalmologist reportedly because of some literature indicating effect on retinopathy/macular edema He can resume exercise again which he has not done lately because of intercurrent problem However weight has been stable Also needs to check readings consistently after meals and bring in monitor for download on next visit   Macroprolactinoma of the pituitary gland with very high baseline prolactin level of 1315 He has done   well with Dostinex and is still taking 0.25 mg twice a week MRI showed no tumor on the last exam No recent headaches  Prolactin level is unusually high at 20 with the same dose which has been taking regularly of his cabergoline  Will recheck prolactin today and decide on any dosage adjustment   HYPOGONADISM:  This was present only at baseline when he had pituitary tumor diagnosis He had a low level last month but this was done late afternoon and will repeat on next visit fasting Currently asymptomatic   HYPERTENSION: Followed by nephrologist, also monitored by patient at home Now on Norvasc and hydralazine   Vitamin D deficiency: His level is improved with supplementation  Anemia and other issues to be followed by nephrologist  LIPIDS: LDL is last below 70, not on statin drug and will recheck periodically      Elayne Snare 04/15/2019, 10:59 AM   Note: This office note was prepared with Dragon voice recognition system technology. Any transcriptional errors that result from  this process are unintentional.  Addendum: Prolactin is normal and he will not change his dosage

## 2019-04-16 LAB — PROLACTIN: Prolactin: 6.9 ng/mL (ref 4.0–15.2)

## 2019-04-16 NOTE — Progress Notes (Signed)
Please call to let patient know that the lab results are normal and no further action needed

## 2019-04-18 ENCOUNTER — Telehealth: Payer: Self-pay

## 2019-04-18 NOTE — Telephone Encounter (Signed)
Called mobile number listed, someone named Merrilee Seashore answered and said it was the wrong number. I tried calling the home number and it sounded like a fax machine.

## 2019-04-18 NOTE — Telephone Encounter (Signed)
-----   Message from Jayme Cloud, LPN sent at [phone]/61/8485  9:33 AM EST -----  ----- Message ----- From: Elayne Snare, MD Sent: 04/16/2019   3:29 PM EST To: Jayme Cloud, LPN  Please call to let patient know that the lab results are normal and no further action needed

## 2019-04-20 DIAGNOSIS — Z20822 Contact with and (suspected) exposure to covid-19: Secondary | ICD-10-CM | POA: Diagnosis not present

## 2019-04-20 DIAGNOSIS — Z01812 Encounter for preprocedural laboratory examination: Secondary | ICD-10-CM | POA: Diagnosis not present

## 2019-04-20 NOTE — Telephone Encounter (Signed)
Thank you Melissa. A letter was also sent to the pt.

## 2019-04-20 NOTE — Telephone Encounter (Signed)
Notified patient of message from Dr.Kumar, patient expressed understanding and agreement. No further questions.

## 2019-04-22 ENCOUNTER — Other Ambulatory Visit: Payer: BC Managed Care – PPO

## 2019-04-25 ENCOUNTER — Other Ambulatory Visit: Payer: BC Managed Care – PPO

## 2019-04-25 DIAGNOSIS — Z7984 Long term (current) use of oral hypoglycemic drugs: Secondary | ICD-10-CM | POA: Diagnosis not present

## 2019-04-25 DIAGNOSIS — E113299 Type 2 diabetes mellitus with mild nonproliferative diabetic retinopathy without macular edema, unspecified eye: Secondary | ICD-10-CM | POA: Diagnosis not present

## 2019-04-25 DIAGNOSIS — E113592 Type 2 diabetes mellitus with proliferative diabetic retinopathy without macular edema, left eye: Secondary | ICD-10-CM | POA: Diagnosis not present

## 2019-04-25 DIAGNOSIS — H4312 Vitreous hemorrhage, left eye: Secondary | ICD-10-CM | POA: Diagnosis not present

## 2019-04-25 DIAGNOSIS — Z79899 Other long term (current) drug therapy: Secondary | ICD-10-CM | POA: Diagnosis not present

## 2019-04-29 ENCOUNTER — Inpatient Hospital Stay: Admission: RE | Admit: 2019-04-29 | Payer: BC Managed Care – PPO | Source: Ambulatory Visit

## 2019-04-29 ENCOUNTER — Ambulatory Visit
Admission: RE | Admit: 2019-04-29 | Discharge: 2019-04-29 | Disposition: A | Discharge status: Home / Self Care | Payer: BC Managed Care – PPO | Source: Ambulatory Visit | Attending: Nephrology | Admitting: Nephrology

## 2019-04-29 DIAGNOSIS — I77811 Abdominal aortic ectasia: Secondary | ICD-10-CM | POA: Diagnosis not present

## 2019-04-29 DIAGNOSIS — I10 Essential (primary) hypertension: Secondary | ICD-10-CM

## 2019-05-09 DIAGNOSIS — H4053X4 Glaucoma secondary to other eye disorders, bilateral, indeterminate stage: Secondary | ICD-10-CM | POA: Diagnosis not present

## 2019-05-16 DIAGNOSIS — H4053X4 Glaucoma secondary to other eye disorders, bilateral, indeterminate stage: Secondary | ICD-10-CM | POA: Diagnosis not present

## 2019-05-26 DIAGNOSIS — I10 Essential (primary) hypertension: Secondary | ICD-10-CM | POA: Diagnosis not present

## 2019-05-26 DIAGNOSIS — N184 Chronic kidney disease, stage 4 (severe): Secondary | ICD-10-CM | POA: Diagnosis not present

## 2019-05-26 DIAGNOSIS — D649 Anemia, unspecified: Secondary | ICD-10-CM | POA: Diagnosis not present

## 2019-05-26 DIAGNOSIS — N2581 Secondary hyperparathyroidism of renal origin: Secondary | ICD-10-CM | POA: Diagnosis not present

## 2019-06-02 ENCOUNTER — Other Ambulatory Visit: Payer: Self-pay | Admitting: Endocrinology

## 2019-06-02 NOTE — Telephone Encounter (Signed)
Noted. Pt was called and informed of the refill and also scheduled for 2 month f/u with same day labs. Same day labs were preferable for pt because he has to fly into town for appt.

## 2019-06-02 NOTE — Telephone Encounter (Signed)
Okay to refill same dose.  Please schedule follow-up appointment in about 2 months with labs

## 2019-06-02 NOTE — Telephone Encounter (Signed)
Was not refilled by you last. Refill or deny?

## 2019-06-21 DIAGNOSIS — Z20828 Contact with and (suspected) exposure to other viral communicable diseases: Secondary | ICD-10-CM | POA: Diagnosis not present

## 2019-06-21 DIAGNOSIS — H4312 Vitreous hemorrhage, left eye: Secondary | ICD-10-CM | POA: Diagnosis not present

## 2019-06-21 DIAGNOSIS — H25811 Combined forms of age-related cataract, right eye: Secondary | ICD-10-CM | POA: Diagnosis not present

## 2019-06-21 DIAGNOSIS — Z01818 Encounter for other preprocedural examination: Secondary | ICD-10-CM | POA: Diagnosis not present

## 2019-06-21 DIAGNOSIS — Z20822 Contact with and (suspected) exposure to covid-19: Secondary | ICD-10-CM | POA: Diagnosis not present

## 2019-06-27 DIAGNOSIS — I129 Hypertensive chronic kidney disease with stage 1 through stage 4 chronic kidney disease, or unspecified chronic kidney disease: Secondary | ICD-10-CM | POA: Diagnosis not present

## 2019-06-27 DIAGNOSIS — H25811 Combined forms of age-related cataract, right eye: Secondary | ICD-10-CM | POA: Diagnosis not present

## 2019-06-27 DIAGNOSIS — E113591 Type 2 diabetes mellitus with proliferative diabetic retinopathy without macular edema, right eye: Secondary | ICD-10-CM | POA: Diagnosis not present

## 2019-06-27 DIAGNOSIS — N183 Chronic kidney disease, stage 3 unspecified: Secondary | ICD-10-CM | POA: Diagnosis not present

## 2019-06-27 DIAGNOSIS — Z79899 Other long term (current) drug therapy: Secondary | ICD-10-CM | POA: Diagnosis not present

## 2019-06-27 DIAGNOSIS — H4312 Vitreous hemorrhage, left eye: Secondary | ICD-10-CM | POA: Diagnosis not present

## 2019-06-27 DIAGNOSIS — E1122 Type 2 diabetes mellitus with diabetic chronic kidney disease: Secondary | ICD-10-CM | POA: Diagnosis not present

## 2019-06-27 DIAGNOSIS — H4010X0 Unspecified open-angle glaucoma, stage unspecified: Secondary | ICD-10-CM | POA: Diagnosis not present

## 2019-06-27 DIAGNOSIS — E1136 Type 2 diabetes mellitus with diabetic cataract: Secondary | ICD-10-CM | POA: Diagnosis not present

## 2019-06-27 DIAGNOSIS — H33001 Unspecified retinal detachment with retinal break, right eye: Secondary | ICD-10-CM | POA: Diagnosis not present

## 2019-06-27 DIAGNOSIS — H2511 Age-related nuclear cataract, right eye: Secondary | ICD-10-CM | POA: Diagnosis not present

## 2019-06-27 DIAGNOSIS — H3321 Serous retinal detachment, right eye: Secondary | ICD-10-CM | POA: Diagnosis not present

## 2019-06-27 DIAGNOSIS — Z7984 Long term (current) use of oral hypoglycemic drugs: Secondary | ICD-10-CM | POA: Diagnosis not present

## 2019-06-27 DIAGNOSIS — H21561 Pupillary abnormality, right eye: Secondary | ICD-10-CM | POA: Diagnosis not present

## 2019-07-04 IMAGING — US US RENAL
1 series · 14 of 25 positions shown · non-contrast
Comparison: None.

CLINICAL DATA: 56-year-old diabetic hypertensive male with chronic
kidney disease. Initial encounter.

EXAM:
RENAL / URINARY TRACT ULTRASOUND COMPLETE

[Series 1: us renal · 0.23mm/px · 14 of 37 slices shown]
[im 1/37]
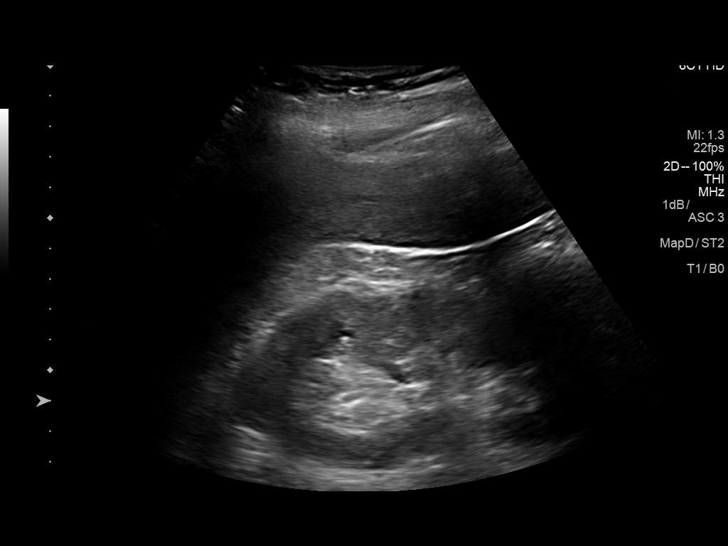
[im 4/37]
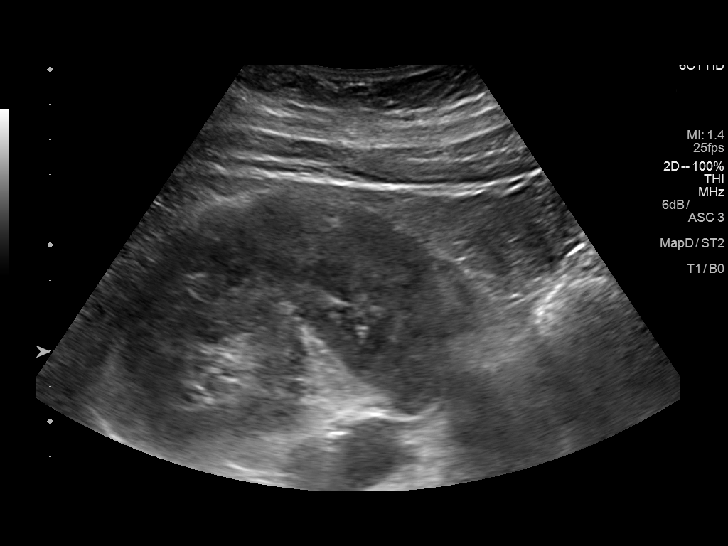
[im 7/37]
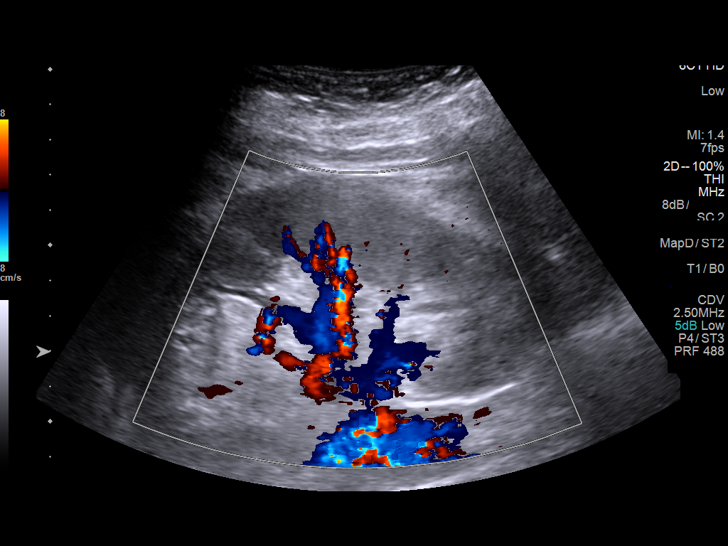
[im 10/37]
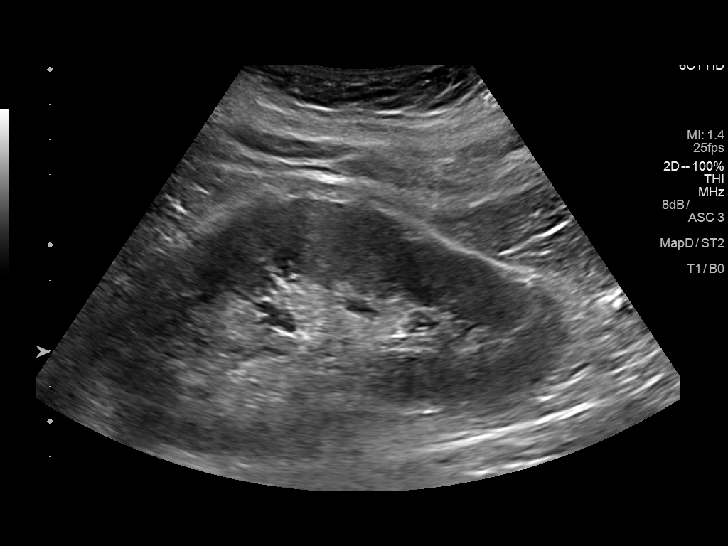
[im 13/37]
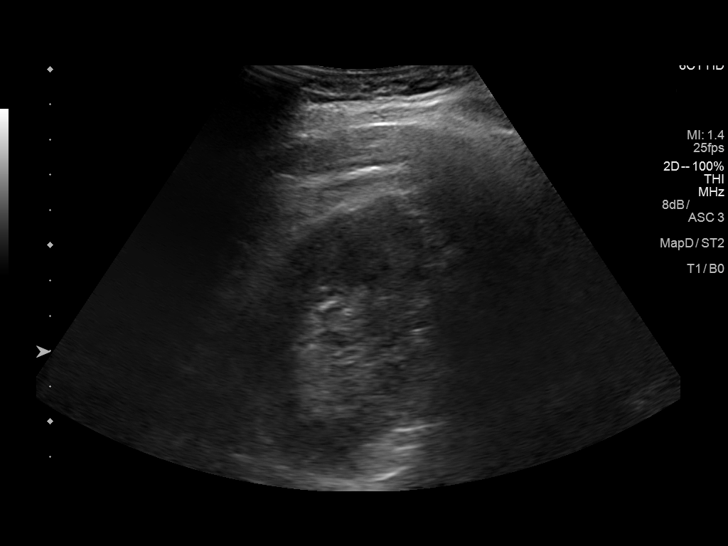
[im 14/37]
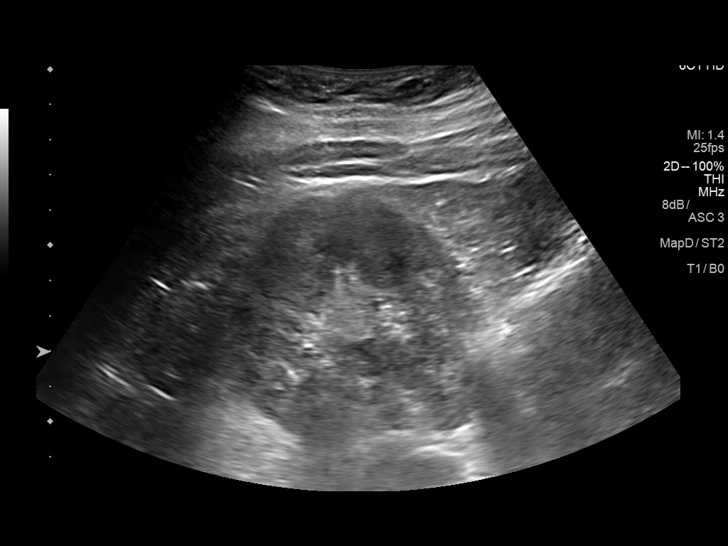
[im 17/37]
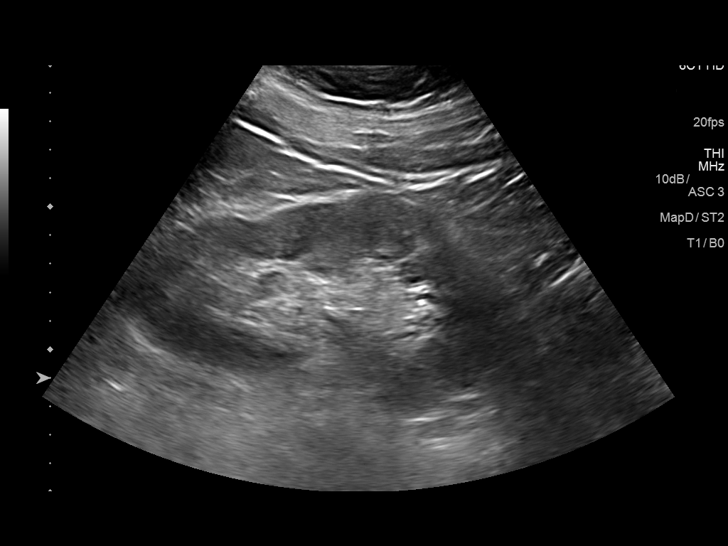
[im 20/37]
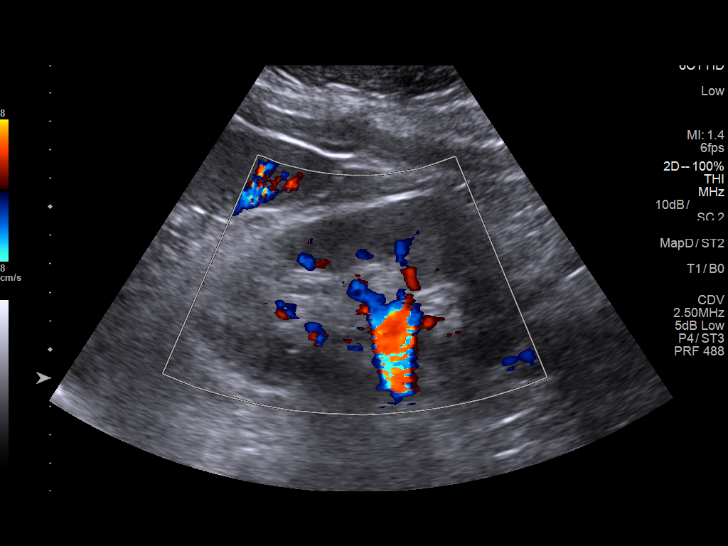
[im 23/37]
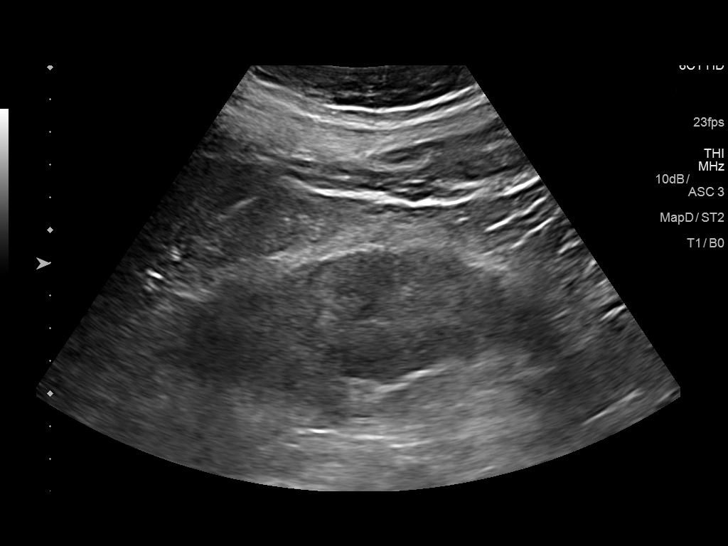
[im 25/37]
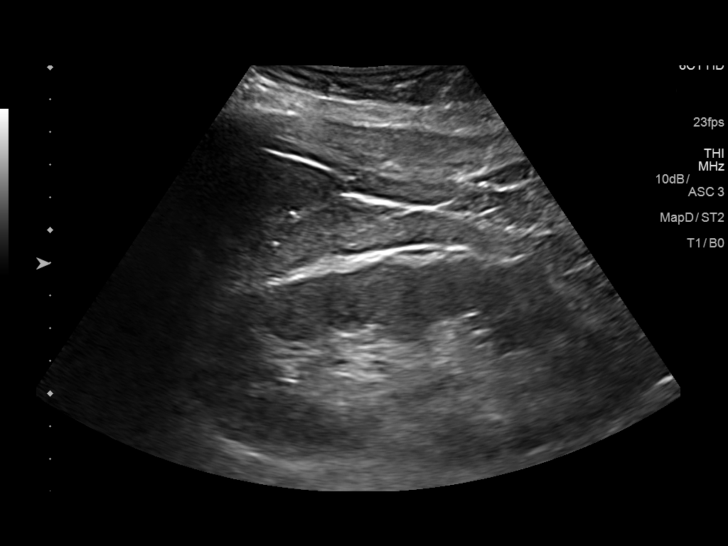
[im 28/37]
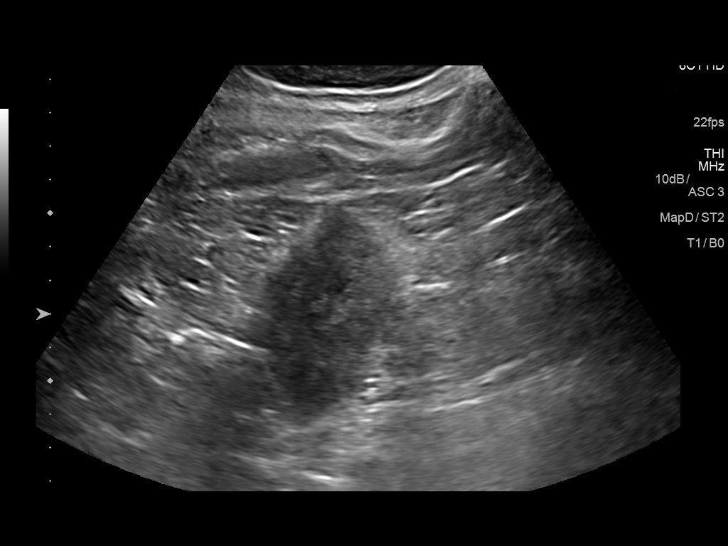
[im 31/37]
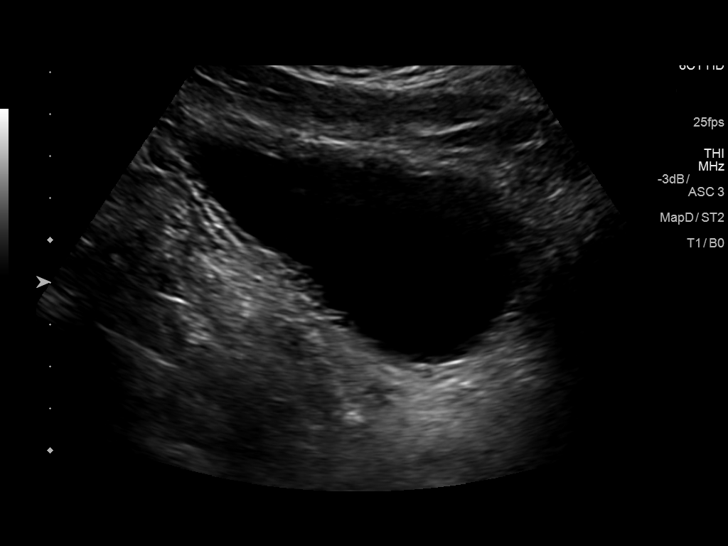
[im 34/37]
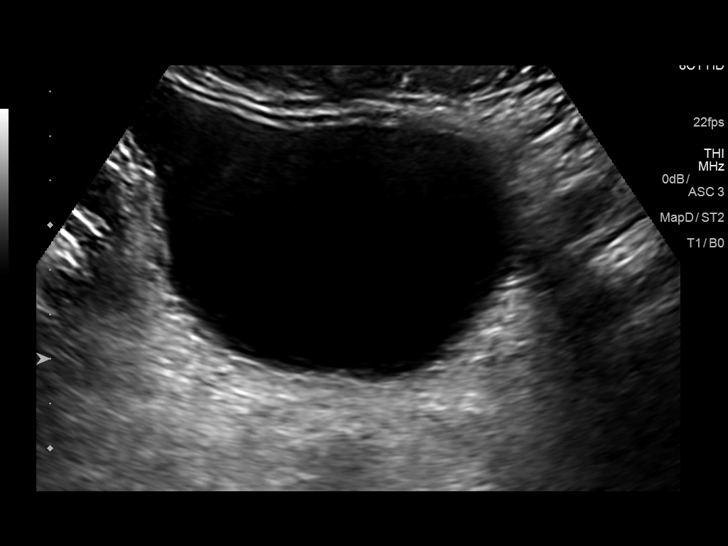
[im 37/37]
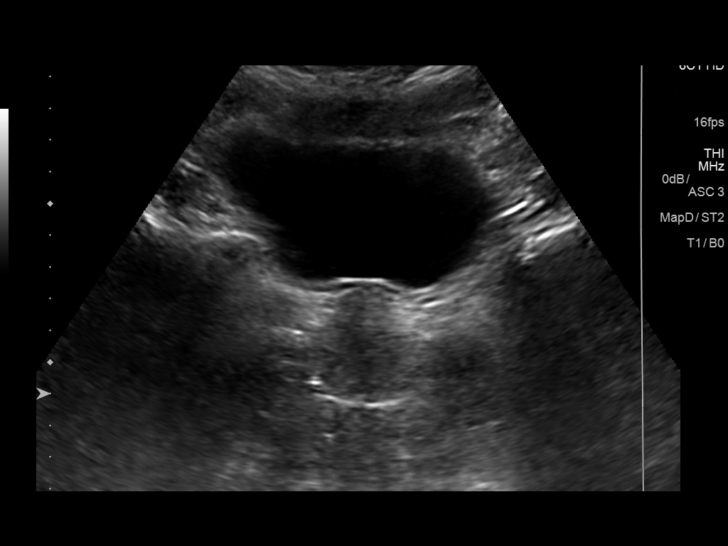

[14 of 25 positions shown; findings below may reference images not displayed]

FINDINGS: Right Kidney:

Length: 13.3 cm. Echogenicity within normal limits. No mass or
hydronephrosis visualized.

Left Kidney:

Length: 12.2 cm. Echogenicity within normal limits. No mass or
hydronephrosis visualized.

Bladder:

Appears normal for degree of bladder distention.

Slightly prominent prostate gland measuring 5.1 x 3.8 x 5 cm.
Prostate gland not adequately assessed by the present ultrasound.
IMPRESSION: Negative renal sonogram.

Slightly prominent prostate gland.

## 2019-07-18 DIAGNOSIS — H4053X4 Glaucoma secondary to other eye disorders, bilateral, indeterminate stage: Secondary | ICD-10-CM | POA: Diagnosis not present

## 2019-07-22 ENCOUNTER — Other Ambulatory Visit: Payer: Self-pay | Admitting: Endocrinology

## 2019-08-08 DIAGNOSIS — H4053X4 Glaucoma secondary to other eye disorders, bilateral, indeterminate stage: Secondary | ICD-10-CM | POA: Diagnosis not present

## 2019-08-09 DIAGNOSIS — E119 Type 2 diabetes mellitus without complications: Secondary | ICD-10-CM | POA: Diagnosis not present

## 2019-08-09 DIAGNOSIS — N184 Chronic kidney disease, stage 4 (severe): Secondary | ICD-10-CM | POA: Diagnosis not present

## 2019-08-09 DIAGNOSIS — I10 Essential (primary) hypertension: Secondary | ICD-10-CM | POA: Diagnosis not present

## 2019-08-09 DIAGNOSIS — N2581 Secondary hyperparathyroidism of renal origin: Secondary | ICD-10-CM | POA: Diagnosis not present

## 2019-08-26 ENCOUNTER — Ambulatory Visit: Payer: BC Managed Care – PPO | Admitting: Endocrinology

## 2019-09-01 DIAGNOSIS — H4051X3 Glaucoma secondary to other eye disorders, right eye, severe stage: Secondary | ICD-10-CM | POA: Diagnosis not present

## 2019-09-02 DIAGNOSIS — Z7984 Long term (current) use of oral hypoglycemic drugs: Secondary | ICD-10-CM | POA: Diagnosis not present

## 2019-09-02 DIAGNOSIS — H1820 Unspecified corneal edema: Secondary | ICD-10-CM | POA: Diagnosis not present

## 2019-09-02 DIAGNOSIS — H4051X3 Glaucoma secondary to other eye disorders, right eye, severe stage: Secondary | ICD-10-CM | POA: Diagnosis not present

## 2019-09-02 DIAGNOSIS — E11311 Type 2 diabetes mellitus with unspecified diabetic retinopathy with macular edema: Secondary | ICD-10-CM | POA: Diagnosis not present

## 2019-09-02 DIAGNOSIS — H4312 Vitreous hemorrhage, left eye: Secondary | ICD-10-CM | POA: Diagnosis not present

## 2019-09-02 DIAGNOSIS — H40003 Preglaucoma, unspecified, bilateral: Secondary | ICD-10-CM | POA: Diagnosis not present

## 2019-09-02 DIAGNOSIS — H3321 Serous retinal detachment, right eye: Secondary | ICD-10-CM | POA: Diagnosis not present

## 2019-09-05 DIAGNOSIS — Z0184 Encounter for antibody response examination: Secondary | ICD-10-CM | POA: Diagnosis not present

## 2019-09-05 DIAGNOSIS — H4053X4 Glaucoma secondary to other eye disorders, bilateral, indeterminate stage: Secondary | ICD-10-CM | POA: Diagnosis not present

## 2019-09-05 DIAGNOSIS — Z125 Encounter for screening for malignant neoplasm of prostate: Secondary | ICD-10-CM | POA: Diagnosis not present

## 2019-09-05 DIAGNOSIS — Z112 Encounter for screening for other bacterial diseases: Secondary | ICD-10-CM | POA: Diagnosis not present

## 2019-09-05 DIAGNOSIS — H4051X3 Glaucoma secondary to other eye disorders, right eye, severe stage: Secondary | ICD-10-CM | POA: Diagnosis not present

## 2019-09-05 DIAGNOSIS — Z111 Encounter for screening for respiratory tuberculosis: Secondary | ICD-10-CM | POA: Diagnosis not present

## 2019-09-15 DIAGNOSIS — E11311 Type 2 diabetes mellitus with unspecified diabetic retinopathy with macular edema: Secondary | ICD-10-CM | POA: Diagnosis not present

## 2019-09-15 DIAGNOSIS — H4051X3 Glaucoma secondary to other eye disorders, right eye, severe stage: Secondary | ICD-10-CM | POA: Diagnosis not present

## 2019-09-19 DIAGNOSIS — R609 Edema, unspecified: Secondary | ICD-10-CM | POA: Diagnosis not present

## 2019-09-19 DIAGNOSIS — E1122 Type 2 diabetes mellitus with diabetic chronic kidney disease: Secondary | ICD-10-CM | POA: Diagnosis not present

## 2019-09-19 DIAGNOSIS — N186 End stage renal disease: Secondary | ICD-10-CM | POA: Diagnosis not present

## 2019-09-19 DIAGNOSIS — I129 Hypertensive chronic kidney disease with stage 1 through stage 4 chronic kidney disease, or unspecified chronic kidney disease: Secondary | ICD-10-CM | POA: Diagnosis not present

## 2019-09-19 DIAGNOSIS — N184 Chronic kidney disease, stage 4 (severe): Secondary | ICD-10-CM | POA: Diagnosis not present

## 2019-09-19 DIAGNOSIS — Z01818 Encounter for other preprocedural examination: Secondary | ICD-10-CM | POA: Diagnosis not present

## 2019-09-19 DIAGNOSIS — Z7984 Long term (current) use of oral hypoglycemic drugs: Secondary | ICD-10-CM | POA: Diagnosis not present

## 2019-09-25 DIAGNOSIS — R9431 Abnormal electrocardiogram [ECG] [EKG]: Secondary | ICD-10-CM | POA: Diagnosis not present

## 2019-09-26 DIAGNOSIS — Z20822 Contact with and (suspected) exposure to covid-19: Secondary | ICD-10-CM | POA: Diagnosis not present

## 2019-09-27 DIAGNOSIS — H4089 Other specified glaucoma: Secondary | ICD-10-CM | POA: Diagnosis not present

## 2019-09-27 DIAGNOSIS — H4051X0 Glaucoma secondary to other eye disorders, right eye, stage unspecified: Secondary | ICD-10-CM | POA: Diagnosis not present

## 2019-09-27 DIAGNOSIS — Z20822 Contact with and (suspected) exposure to covid-19: Secondary | ICD-10-CM | POA: Diagnosis not present

## 2019-09-27 DIAGNOSIS — H4051X3 Glaucoma secondary to other eye disorders, right eye, severe stage: Secondary | ICD-10-CM | POA: Diagnosis not present

## 2019-09-27 DIAGNOSIS — H4053X4 Glaucoma secondary to other eye disorders, bilateral, indeterminate stage: Secondary | ICD-10-CM | POA: Diagnosis not present

## 2019-09-29 ENCOUNTER — Other Ambulatory Visit: Payer: Self-pay

## 2019-09-29 ENCOUNTER — Encounter: Payer: Self-pay | Admitting: Endocrinology

## 2019-09-29 ENCOUNTER — Ambulatory Visit (INDEPENDENT_AMBULATORY_CARE_PROVIDER_SITE_OTHER): Payer: BC Managed Care – PPO | Admitting: Endocrinology

## 2019-09-29 VITALS — BP 194/96 | HR 77 | Ht 72.0 in

## 2019-09-29 DIAGNOSIS — E669 Obesity, unspecified: Secondary | ICD-10-CM

## 2019-09-29 DIAGNOSIS — E1169 Type 2 diabetes mellitus with other specified complication: Secondary | ICD-10-CM

## 2019-09-29 DIAGNOSIS — D352 Benign neoplasm of pituitary gland: Secondary | ICD-10-CM | POA: Diagnosis not present

## 2019-09-29 DIAGNOSIS — E1121 Type 2 diabetes mellitus with diabetic nephropathy: Secondary | ICD-10-CM | POA: Diagnosis not present

## 2019-09-29 DIAGNOSIS — E559 Vitamin D deficiency, unspecified: Secondary | ICD-10-CM | POA: Diagnosis not present

## 2019-09-29 NOTE — Addendum Note (Signed)
Addended by: Elayne Snare on: 09/29/2019 04:49 PM   Modules accepted: Orders

## 2019-09-29 NOTE — Patient Instructions (Signed)
Stop Glipizide  Check blood sugars on waking up 2 days a week  Also check blood sugars about 2 hours after meals and do this after different meals by rotation  Recommended blood sugar levels on waking up are 80-120 and about 2 hours after meal is 130-160  Please bring your blood sugar monitor to each visit, thank you

## 2019-09-29 NOTE — Progress Notes (Signed)
Patient ID: Roberto [name], male   DOB: Nov 07, 1960, 59 y.o.   MRN: [id number]   Chief complaint: Follow-up of multiple endocrine problems  History of Present Illness:  PROBLEM 1: Pituitary tumor  His MRI on 07/18/14 showed a pituitary tumor with the following description: 2.3 x 2 x 1.8 cm mass centered in the sella with suprasellar extension and cavernous sinus extension greater on the left with an appearance most suggestive of pituitary macroadenoma. This impresses upon the optic chiasm  Had difficulty with peripheral vision on the left side but this has resolved He being followed by ophthalmologist periodically for this and macular edema   MRI in 2/17 showing focal area of delayed enhancement this represents a pituitary adenoma on the left.  This lesion measures 15 x 12 x 11 mm.   Has had a follow-up in 12/18 which showed the following Slightly distorted architecture of the pituitary gland with rightward deviation of the pituitary stalk and rightward positioning of the pituitary gland and the sella with T2 hyperintense material on the left which could be cystic treated tumor versus CSF.  A discrete mass with mass effect on the chiasm is not identified.   PROBLEM 2:  Hyperprolactinemia:  At baseline he had a prolactin level checked and this was markedly increased at 1312 He had symptoms of decreased libido and gynecomastia Testosterone level at baseline was low at 70  He  was started on Dostinex 0.5 mg initially half tablet twice a week and then 1 tablet twice a week on his initial consultation on 08/04/14 He is tolerating the Dostinex without any nausea. Prolactin has been subsequently normal since 09/2014    He is still taking half tablet of Dostinex twice a week on Mondays and Fridays  His prolactin was last checked in 1/21 and was normal although prior level was slightly higher  No recent headaches  Lab Results  Component Value Date   PROLACTIN 6.9 04/15/2019    PROLACTIN 20.3 (H) 03/18/2019   PROLACTIN 7.8 09/22/2018   PROLACTIN 3.7 (L) 12/21/2017   PROLACTIN 6.7 05/05/2017   . HYPOGONADISM:   This was mostly transient after initial diagnosis of large prolactinoma  Still feels fairly good overall His last testosterone level was done late afternoon and not fasting   Lab Results  Component Value Date   TESTOSTERONE 245 (L) 03/18/2019   THYROID function:  free T4 consistently normal  Lab Results  Component Value Date   TSH 0.404 07/18/2014   FREET4 1.16 04/15/2019   FREET4 0.90 01/30/2017   FREET4 0.80 10/07/2016     PROBLEM 3:  DIABETES type II with obesity    He was diagnosed  in 2014  and has been Previously managed by a primary care physician who he works with. On admission to the hospital in 2016 his glucose was 380 without any evidence of ketosis  Highest A1c had been 8%, subsequently in the normal range since 11/18  A1c is now 4.7 done outside  He does not bring his monitor for download as before  NON-insulin hypoglycemic drugs currently used: Only occasional glipizide ER 5 mg daily  Current management, blood sugar patterns and problems identified:  He has been told by his nephrologist to take glipizide every 2 weeks  However he appears to have consistently normal blood sugars at home and in the lab  He tries to exercise with walking or weight training, cannot use the treadmill because of his eyesight  Has been able to maintain  his weight and he thinks it is down to 234 compared to 239 on his last visit  No hypoglycemia recently   Last visit with dietitian: 01/2018  Weight history:   Wt Readings from Last 3 Encounters:  04/15/19 239 lb 9.6 oz (108.7 kg)  03/21/19 237 lb 11.2 oz (107.8 kg)  09/24/18 239 lb (108.4 kg)    Lab Results  Component Value Date   HGBA1C 6.0 (H) 03/18/2019   HGBA1C 5.8 09/22/2018   HGBA1C 6.1 12/21/2017   Lab Results  Component Value Date   MICROALBUR 90.6 (H) 10/10/2016    LDLCALC 60 03/18/2019   CREATININE 3.61 (H) 03/26/2019    OTHER active problems and review of systems    Past Medical History:  Diagnosis Date   Anemia    Detached retina    Diabetes mellitus without complication ([place])    H/O hypogonadism    Hypertension    Prolactinoma, benign ([place])     Past Surgical History:  Procedure Laterality Date   KIDNEY STONE SURGERY  1987   NO PAST SURGERIES      Family History  Problem Relation Age of Onset   Diabetes Mother    Colon polyps Mother    Heart disease Mother    Hypertension Mother    Colon cancer Father    Kidney disease Maternal Grandmother    Colon cancer Maternal Grandfather    Esophageal cancer Maternal Uncle    Colon cancer Maternal Uncle     Social History:  reports that he has never smoked. He has never used smokeless tobacco. He reports that he does not drink alcohol and does not use drugs.  Allergies:  Allergies  Allergen Reactions   Contrast Media [Iodinated Diagnostic Agents] Rash    Allergies as of 09/29/2019      Reactions   Contrast Media [iodinated Diagnostic Agents] Rash      Medication List       Accurate as of September 29, 2019  4:41 PM. If you have any questions, ask your nurse or doctor.        amLODipine 10 MG tablet Commonly known as: NORVASC Take 5 mg by mouth at bedtime.   aspirin EC 81 MG tablet Take 81 mg by mouth daily.   cabergoline 0.5 MG tablet Commonly known as: DOSTINEX TAKE 1/2 TABLET TWICE A WEEK   cholecalciferol 1000 units tablet Commonly known as: VITAMIN D Take 2,000 Units by mouth daily.   glipiZIDE 5 MG tablet Commonly known as: GLUCOTROL Take 5 mg by mouth daily before breakfast. Patient taking every two weeks   hydrALAZINE 50 MG tablet Commonly known as: APRESOLINE Take 75 mg by mouth 3 (three) times daily. Take 1.5 tablets by mouth three times daily.   isosorbide mononitrate 60 MG 24 hr tablet Commonly known as: IMDUR Take 60 mg by mouth  daily.   timolol 0.25 % ophthalmic solution Commonly known as: BETIMOL Place 1-2 drops into both eyes 2 (two) times daily.   Vitamin D (Ergocalciferol) 1.25 MG (50000 UNIT) Caps capsule Commonly known as: DRISDOL TAKE 1 CAPSULE ONCE A WEEK FOR 12 WEEKS, THEN TAKE 1 CAPSULE ONCE A MONTH       LABS:  No visits with results within 1 Week(s) from this visit.  Latest known visit with results is:  Office Visit on 04/15/2019  Component Date Value Ref Range Status   Prolactin 04/15/2019 6.9  4.0 - 15.2 ng/mL Final   Glucose, Bld 04/15/2019 114* 70 - 99  mg/dL Final   Free T4 04/15/2019 1.16  0.60 - 1.60 ng/dL Final   Comment: Specimens from patients who are undergoing biotin therapy and /or ingesting biotin supplements may contain high levels of biotin.  The higher biotin concentration in these specimens interferes with this Free T4 assay.  Specimens that contain high levels  of biotin may cause false high results for this Free T4 assay.  Please interpret results in light of the total clinical presentation of the patient.       REVIEW OF SYSTEMS:        Eyes: He has multiple problems including vitreous hemorrhage, glaucoma with decreased vision bilaterally  No history of hyperlipidemia  Lab Results  Component Value Date   CHOL 112 03/18/2019   HDL 36 (L) 03/18/2019   LDLCALC 60 03/18/2019   TRIG 82 03/18/2019   CHOLHDL 3.1 03/18/2019     Hypertension: has had a long history of high blood pressure   Currently on regimen of amlodipine 10 mg, carvedilol and hydralazine 50  He is treated by nephrologist  Monitoring at home also, he thinks his blood pressure has been nearly normal, mostly 053 systolic  He thinks his blood pressure is higher today because of not taking his medication  BP Readings from Last 3 Encounters:  09/29/19 (!) 194/96  04/15/19 (!) 144/70  03/27/19 (!) 156/95     NEPHROPATHY: He has persistent increase in microalbumin Has had renal  biopsy Currently waiting for renal transplant  Followed by nephrologist with creatinine history as follows:    Lab Results  Component Value Date   CREATININE 3.61 (H) 03/26/2019   CREATININE 3.97 (H) 03/20/2019   CREATININE 3.61 (H) 03/18/2019   CREATININE 2.95 (H) 09/22/2018    No symptoms of NEUROPATHY: Last foot exam 08/2018    VITAMIN D deficiency: This was done as a screening and at baseline was low at 5.8.   Has been on 3000U supplements   Reportedly PTH is only mildly increased above normal and also serum calcium is normal  Lab Results  Component Value Date   VD25OH 42.6 03/18/2019   VD25OH 12.24 (L) 09/22/2018   VD25OH 24.07 (L) 12/21/2017     Lab Results  Component Value Date   CALCIUM 9.1 03/26/2019     PHYSICAL EXAM:  BP (!) 194/96 (BP Location: Left Arm, Patient Position: Sitting, Cuff Size: Large) Comment: MD ntfd   Pulse 77    Ht 6' (1.829 m)    SpO2 96%    BMI 32.50 kg/m   He is refusing the weight today  ASSESSMENT/PLAN:   DIABETES type II, non--insulin-requiring with mild obesity  See history of present illness for detailed discussion of current diabetes management, blood sugar patterns and problems identified  His A1c is normal although may be falsely low at 4.7 done elsewhere because of his renal failure  Likely because of his renal failure he is not needing any medications Since his blood sugars are fairly good with only occasional dose of glipizide he can stop this completely now Encouraged him to continue regular exercise   Macroprolactinoma of the pituitary gland with very high baseline prolactin level of 1315 This has been treated successfully with Dostinex and is still taking 0.25 mg twice a week MRI showed no tumor on the last exam No recent headaches  Prolactin level to be checked today, was normal on the last visit  Visual difficulties: Since he thinks that he has some field defects and his ophthalmologist was  concerned about  recurrent tumor, will order MRI to evaluate pituitary area   HYPOGONADISM:  This was present only at baseline when he had pituitary tumor diagnosis He had a low level on the last visit but this was done late afternoon  We will follow with fasting levels on the next visit   HYPERTENSION: Followed by nephrologist, also monitored by patient at home Blood pressure higher today from apparently not taking his medication   Vitamin D deficiency: On supplements, to have follow-up level today  Follow-up in 4 months     Keyra Virella 09/29/2019, 4:41 PM   Note: This office note was prepared with Dragon voice recognition system technology. Any transcriptional errors that result from this process are unintentional.  Addendum: All labs normal, fructosamine mildly increased, need to check more readings after meals

## 2019-09-30 LAB — GLUCOSE, RANDOM: Glucose, Bld: 100 mg/dL — ABNORMAL HIGH (ref 70–99)

## 2019-09-30 LAB — PROLACTIN: Prolactin: 5 ng/mL (ref 4.0–15.2)

## 2019-09-30 LAB — FRUCTOSAMINE: Fructosamine: 314 umol/L — ABNORMAL HIGH (ref 0–285)

## 2019-09-30 LAB — VITAMIN D 25 HYDROXY (VIT D DEFICIENCY, FRACTURES): VITD: 42.95 ng/mL (ref 30.00–100.00)

## 2019-09-30 NOTE — Progress Notes (Signed)
Please call to let patient know that the prolactin results are normal. Continue to check readings regularly after meals and let us know if they are consistently high. To stay without glipizide

## 2019-10-27 DIAGNOSIS — H3321 Serous retinal detachment, right eye: Secondary | ICD-10-CM | POA: Diagnosis not present

## 2019-10-27 DIAGNOSIS — H4053X3 Glaucoma secondary to other eye disorders, bilateral, severe stage: Secondary | ICD-10-CM | POA: Diagnosis not present

## 2019-10-27 DIAGNOSIS — H33002 Unspecified retinal detachment with retinal break, left eye: Secondary | ICD-10-CM | POA: Diagnosis not present

## 2019-10-27 DIAGNOSIS — E11311 Type 2 diabetes mellitus with unspecified diabetic retinopathy with macular edema: Secondary | ICD-10-CM | POA: Diagnosis not present

## 2019-10-28 DIAGNOSIS — I6523 Occlusion and stenosis of bilateral carotid arteries: Secondary | ICD-10-CM | POA: Diagnosis not present

## 2019-10-28 DIAGNOSIS — Z01810 Encounter for preprocedural cardiovascular examination: Secondary | ICD-10-CM | POA: Diagnosis not present

## 2019-10-28 DIAGNOSIS — N189 Chronic kidney disease, unspecified: Secondary | ICD-10-CM | POA: Diagnosis not present

## 2019-10-28 DIAGNOSIS — N184 Chronic kidney disease, stage 4 (severe): Secondary | ICD-10-CM | POA: Diagnosis not present

## 2019-10-28 DIAGNOSIS — Z01818 Encounter for other preprocedural examination: Secondary | ICD-10-CM | POA: Diagnosis not present

## 2019-11-03 DIAGNOSIS — E119 Type 2 diabetes mellitus without complications: Secondary | ICD-10-CM | POA: Diagnosis not present

## 2019-11-03 DIAGNOSIS — I10 Essential (primary) hypertension: Secondary | ICD-10-CM | POA: Diagnosis not present

## 2019-11-03 DIAGNOSIS — N2581 Secondary hyperparathyroidism of renal origin: Secondary | ICD-10-CM | POA: Diagnosis not present

## 2019-11-03 DIAGNOSIS — N184 Chronic kidney disease, stage 4 (severe): Secondary | ICD-10-CM | POA: Diagnosis not present

## 2019-11-17 ENCOUNTER — Ambulatory Visit
Admission: RE | Admit: 2019-11-17 | Discharge: 2019-11-17 | Disposition: A | Discharge status: Home / Self Care | Payer: BC Managed Care – PPO | Source: Ambulatory Visit | Attending: Endocrinology | Admitting: Endocrinology

## 2019-11-17 ENCOUNTER — Other Ambulatory Visit: Payer: Self-pay

## 2019-11-17 DIAGNOSIS — R220 Localized swelling, mass and lump, head: Secondary | ICD-10-CM | POA: Diagnosis not present

## 2019-11-17 DIAGNOSIS — G9389 Other specified disorders of brain: Secondary | ICD-10-CM | POA: Diagnosis not present

## 2019-11-17 DIAGNOSIS — J3489 Other specified disorders of nose and nasal sinuses: Secondary | ICD-10-CM | POA: Diagnosis not present

## 2019-11-17 DIAGNOSIS — D352 Benign neoplasm of pituitary gland: Secondary | ICD-10-CM | POA: Diagnosis not present

## 2019-12-01 DIAGNOSIS — H4053X3 Glaucoma secondary to other eye disorders, bilateral, severe stage: Secondary | ICD-10-CM | POA: Diagnosis not present

## 2019-12-01 DIAGNOSIS — E113393 Type 2 diabetes mellitus with moderate nonproliferative diabetic retinopathy without macular edema, bilateral: Secondary | ICD-10-CM | POA: Diagnosis not present

## 2019-12-01 DIAGNOSIS — H4312 Vitreous hemorrhage, left eye: Secondary | ICD-10-CM | POA: Diagnosis not present

## 2019-12-01 DIAGNOSIS — H33002 Unspecified retinal detachment with retinal break, left eye: Secondary | ICD-10-CM | POA: Diagnosis not present

## 2019-12-15 DIAGNOSIS — I10 Essential (primary) hypertension: Secondary | ICD-10-CM | POA: Diagnosis not present

## 2020-01-02 DIAGNOSIS — H4053X4 Glaucoma secondary to other eye disorders, bilateral, indeterminate stage: Secondary | ICD-10-CM | POA: Diagnosis not present

## 2020-01-02 DIAGNOSIS — H4051X3 Glaucoma secondary to other eye disorders, right eye, severe stage: Secondary | ICD-10-CM | POA: Diagnosis not present

## 2020-01-04 DIAGNOSIS — H4051X3 Glaucoma secondary to other eye disorders, right eye, severe stage: Secondary | ICD-10-CM | POA: Diagnosis not present

## 2020-01-04 DIAGNOSIS — E11311 Type 2 diabetes mellitus with unspecified diabetic retinopathy with macular edema: Secondary | ICD-10-CM | POA: Diagnosis not present

## 2020-01-04 DIAGNOSIS — D352 Benign neoplasm of pituitary gland: Secondary | ICD-10-CM | POA: Diagnosis not present

## 2020-01-04 DIAGNOSIS — H4053X4 Glaucoma secondary to other eye disorders, bilateral, indeterminate stage: Secondary | ICD-10-CM | POA: Diagnosis not present

## 2020-01-04 LAB — HM DIABETES EYE EXAM

## 2020-01-12 DIAGNOSIS — H4051X3 Glaucoma secondary to other eye disorders, right eye, severe stage: Secondary | ICD-10-CM | POA: Diagnosis not present

## 2020-01-12 DIAGNOSIS — H33003 Unspecified retinal detachment with retinal break, bilateral: Secondary | ICD-10-CM | POA: Diagnosis not present

## 2020-01-12 DIAGNOSIS — H4312 Vitreous hemorrhage, left eye: Secondary | ICD-10-CM | POA: Diagnosis not present

## 2020-01-12 DIAGNOSIS — E11311 Type 2 diabetes mellitus with unspecified diabetic retinopathy with macular edema: Secondary | ICD-10-CM | POA: Diagnosis not present

## 2020-01-24 ENCOUNTER — Encounter: Payer: Self-pay | Admitting: *Deleted

## 2020-01-25 DIAGNOSIS — H4051X3 Glaucoma secondary to other eye disorders, right eye, severe stage: Secondary | ICD-10-CM | POA: Diagnosis not present

## 2020-02-06 DIAGNOSIS — H4051X3 Glaucoma secondary to other eye disorders, right eye, severe stage: Secondary | ICD-10-CM | POA: Diagnosis not present

## 2020-02-07 DIAGNOSIS — I10 Essential (primary) hypertension: Secondary | ICD-10-CM | POA: Diagnosis not present

## 2020-02-07 DIAGNOSIS — N2581 Secondary hyperparathyroidism of renal origin: Secondary | ICD-10-CM | POA: Diagnosis not present

## 2020-02-07 DIAGNOSIS — E119 Type 2 diabetes mellitus without complications: Secondary | ICD-10-CM | POA: Diagnosis not present

## 2020-02-07 DIAGNOSIS — N184 Chronic kidney disease, stage 4 (severe): Secondary | ICD-10-CM | POA: Diagnosis not present

## 2020-02-19 ENCOUNTER — Other Ambulatory Visit: Payer: Self-pay | Admitting: Endocrinology

## 2020-03-01 DIAGNOSIS — H4051X3 Glaucoma secondary to other eye disorders, right eye, severe stage: Secondary | ICD-10-CM | POA: Diagnosis not present

## 2020-03-01 DIAGNOSIS — H4312 Vitreous hemorrhage, left eye: Secondary | ICD-10-CM | POA: Diagnosis not present

## 2020-03-01 DIAGNOSIS — E11311 Type 2 diabetes mellitus with unspecified diabetic retinopathy with macular edema: Secondary | ICD-10-CM | POA: Diagnosis not present

## 2020-03-01 DIAGNOSIS — Z8669 Personal history of other diseases of the nervous system and sense organs: Secondary | ICD-10-CM | POA: Diagnosis not present

## 2020-03-08 ENCOUNTER — Other Ambulatory Visit: Payer: Self-pay | Admitting: Endocrinology

## 2020-03-21 DIAGNOSIS — H4052X3 Glaucoma secondary to other eye disorders, left eye, severe stage: Secondary | ICD-10-CM | POA: Diagnosis not present

## 2020-03-21 DIAGNOSIS — H4051X3 Glaucoma secondary to other eye disorders, right eye, severe stage: Secondary | ICD-10-CM | POA: Diagnosis not present

## 2020-04-03 ENCOUNTER — Ambulatory Visit: Payer: BC Managed Care – PPO | Admitting: Endocrinology

## 2020-04-19 DIAGNOSIS — E11311 Type 2 diabetes mellitus with unspecified diabetic retinopathy with macular edema: Secondary | ICD-10-CM | POA: Diagnosis not present

## 2020-04-19 DIAGNOSIS — H4051X3 Glaucoma secondary to other eye disorders, right eye, severe stage: Secondary | ICD-10-CM | POA: Diagnosis not present

## 2020-04-19 DIAGNOSIS — H33002 Unspecified retinal detachment with retinal break, left eye: Secondary | ICD-10-CM | POA: Diagnosis not present

## 2020-04-19 DIAGNOSIS — H3321 Serous retinal detachment, right eye: Secondary | ICD-10-CM | POA: Diagnosis not present

## 2020-04-24 DIAGNOSIS — H4051X3 Glaucoma secondary to other eye disorders, right eye, severe stage: Secondary | ICD-10-CM | POA: Diagnosis not present

## 2020-04-24 DIAGNOSIS — E11311 Type 2 diabetes mellitus with unspecified diabetic retinopathy with macular edema: Secondary | ICD-10-CM | POA: Diagnosis not present

## 2020-04-27 IMAGING — CT CT HEAD W/O CM
3 series · 14 of 47 positions shown, 16 images · non-contrast
Comparison: CT head 07/18/2014.  MRI brain 05/12/2015.

CLINICAL DATA: Occipital pain with transient bilateral blindness
today. Patient indicates left ocular implant 4 years ago and
silicone in right eye.

EXAM:
CT HEAD WITHOUT CONTRAST
TECHNIQUE: Contiguous axial images were obtained from the base of the skull
through the vertex without intravenous contrast.

[Series 3: head 5.0 h30s · axial · 0.52mm/px · z∈[-179,-34]mm · 8 of 35 slices shown, 10 images]
[im 3/35  brain]
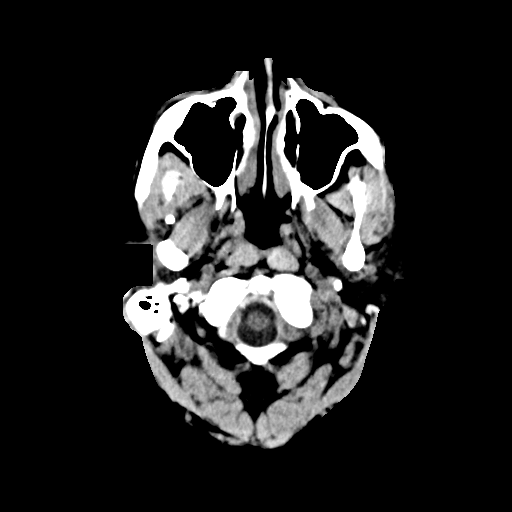
[im 3/35  bone]
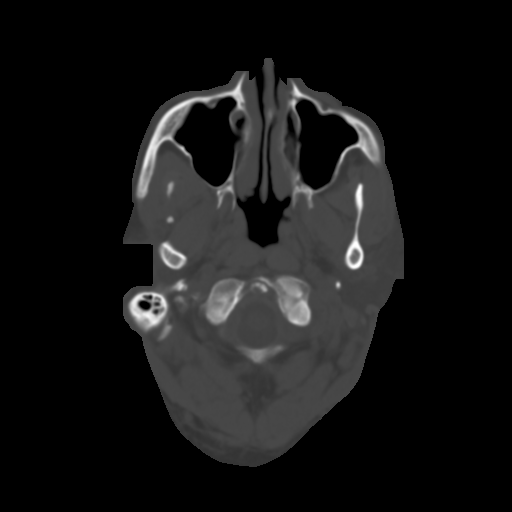
[im 8/35  brain]
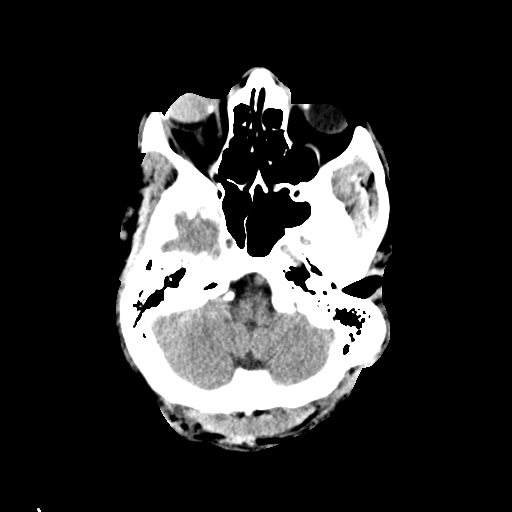
[im 11/35  brain]
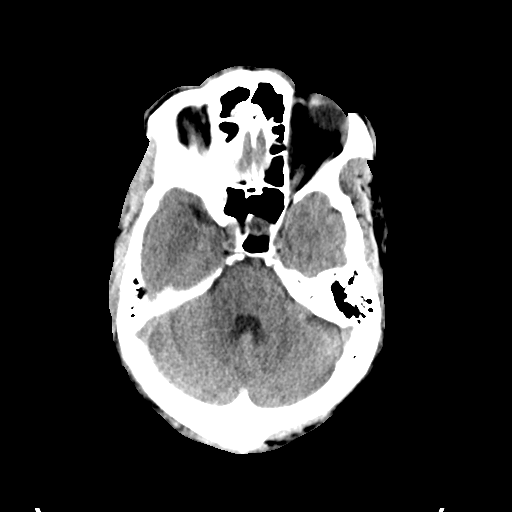
[im 16/35  brain]
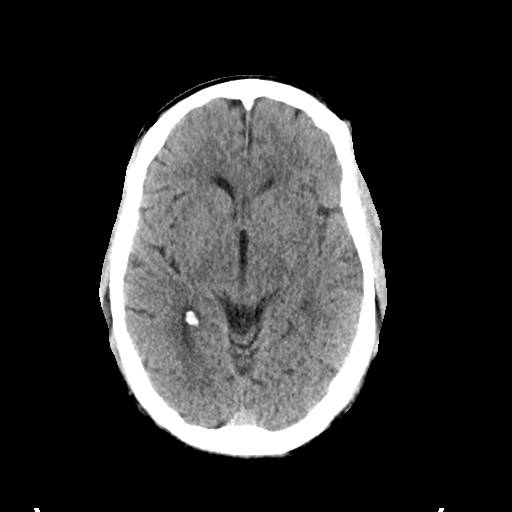
[im 19/35  brain]
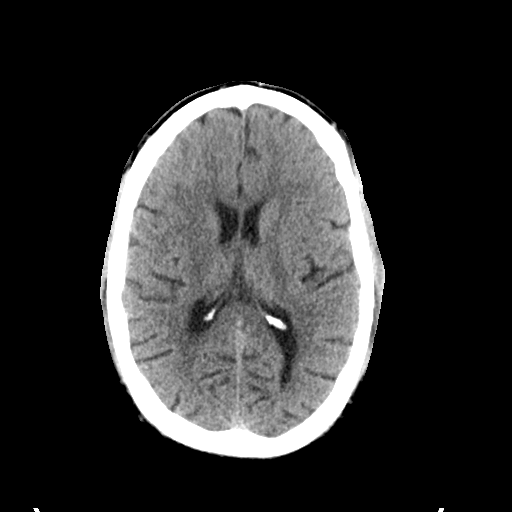
[im 19/35  bone]
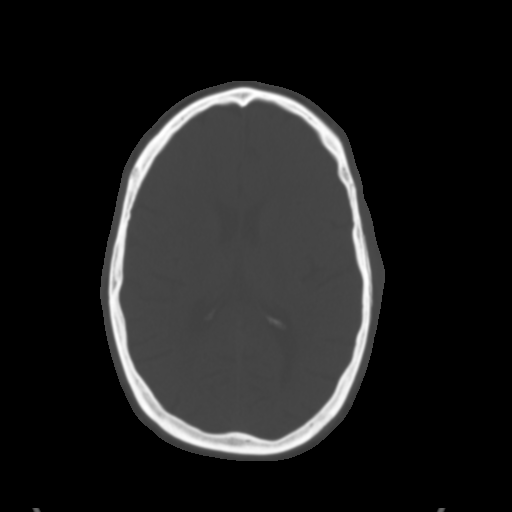
[im 24/35  brain]
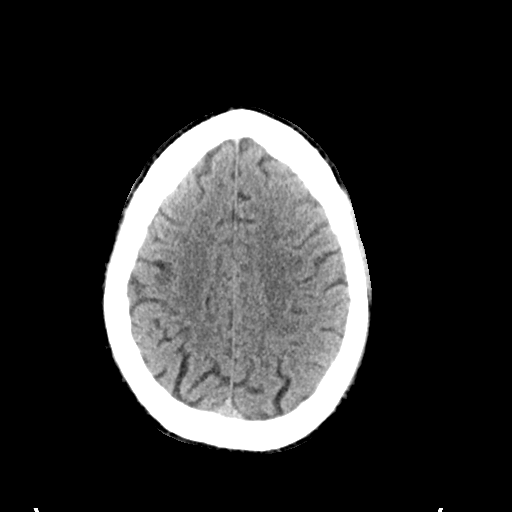
[im 27/35  brain]
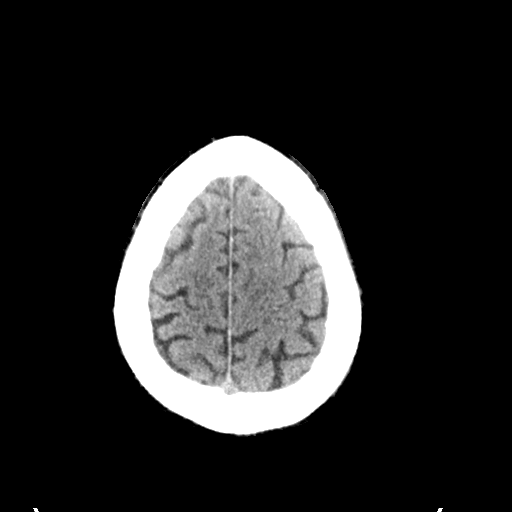
[im 32/35  brain]
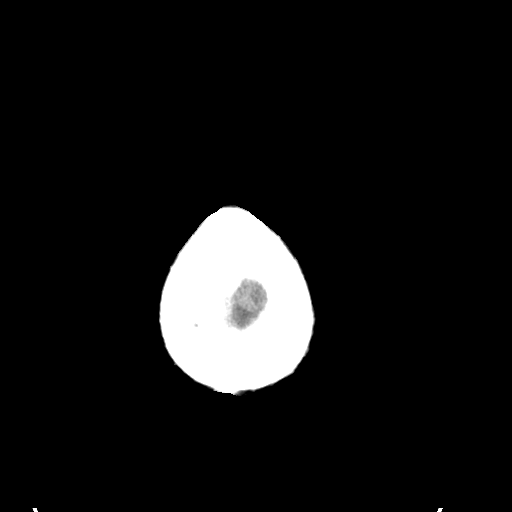

[Series 5: head 3.0 mpr cor · coronal · 0.39mm/px · 3 of 72 slices shown]
[im 24/72  brain]
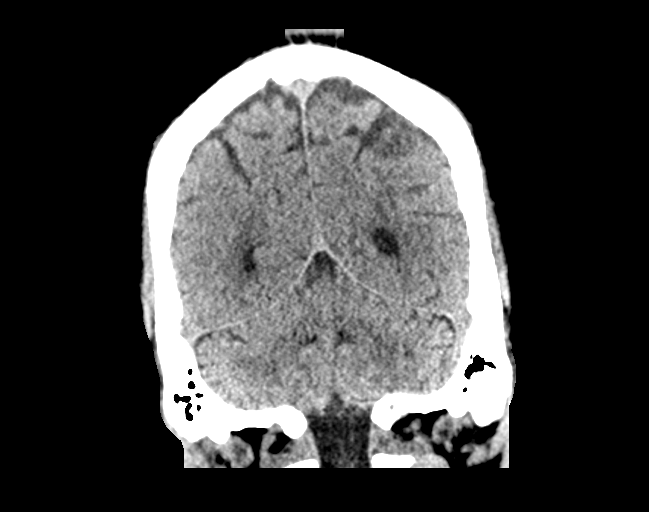
[im 32/72  brain]
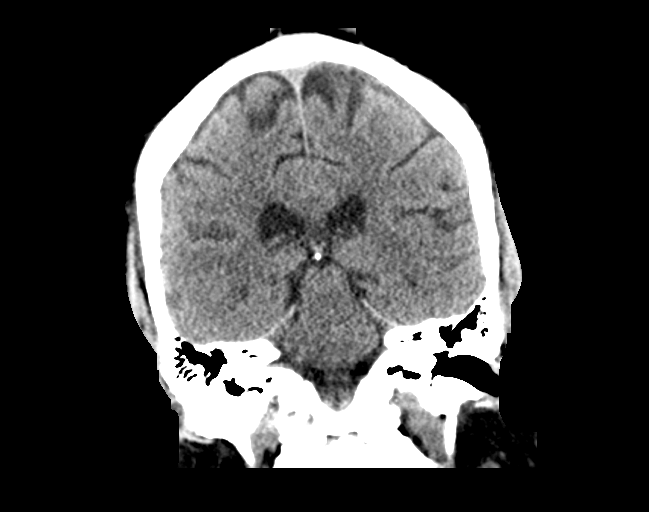
[im 40/72  brain]
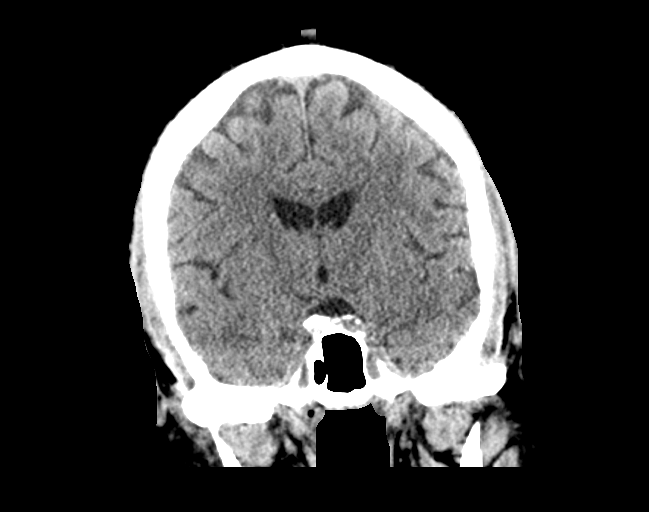

[Series 6: head 3.0 mpr sag · sagittal · 0.33mm/px · 3 of 66 slices shown]
[im 22/66  brain]
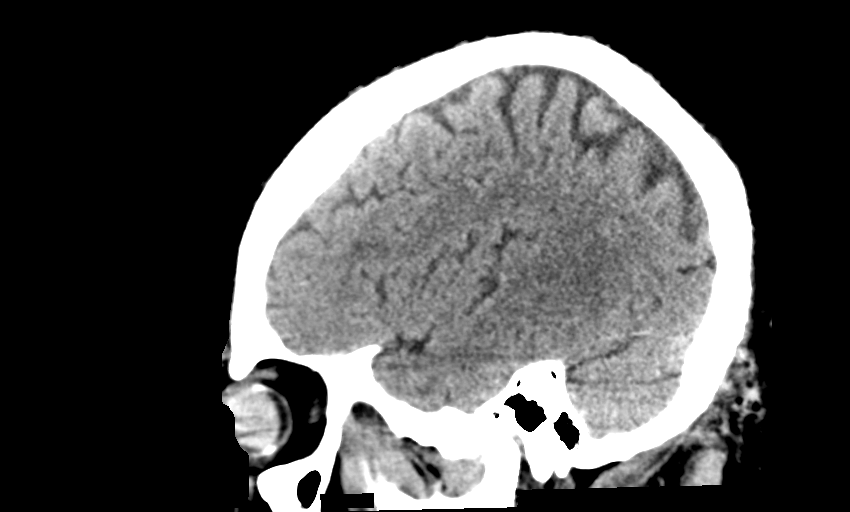
[im 33/66  brain]
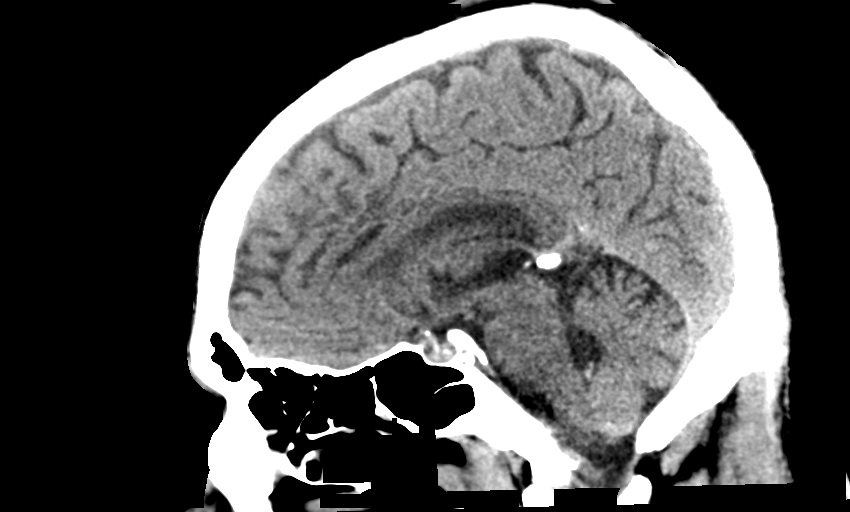
[im 44/66  brain]
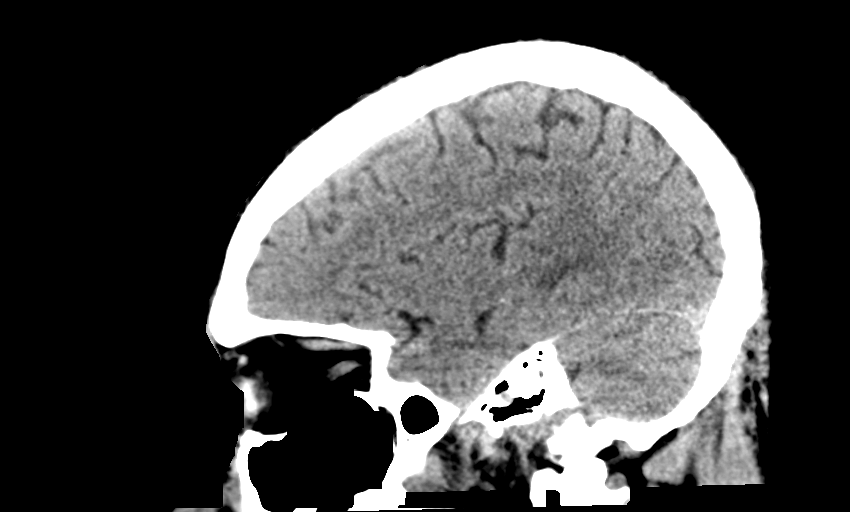

[14 of 47 positions shown; findings below may reference images not displayed]

FINDINGS: Brain: There is no evidence of acute intracranial hemorrhage, mass
lesion, brain edema or extra-axial fluid collection. The ventricles
and subarachnoid spaces are appropriately sized for age. There is no
CT evidence of acute cortical infarction. Mild chronic small vessel
ischemic changes in the periventricular white matter.

Vascular: Intracranial vascular calcifications. No hyperdense vessel
identified.

Skull: Negative for fracture or focal lesion.

Sinuses/Orbits: Mild mucosal thickening in the maxillary sinuses.
The visualized paranasal sinuses, mastoid air cells and middle ears
are otherwise clear. Interval bilateral globe surgery with lens
removal on the left. There is high-density material anteriorly in
the right globe which may relate to the reported intra-ocular
silicone gel, although vitreous hemorrhage cannot be excluded.

Other: None.
IMPRESSION: 1. Interval bilateral globe surgery with high density material
anteriorly in the right globe which may relate to the reported
intra-ocular silicone gel. Cannot exclude vitreous hemorrhage.
Correlation with prior surgical history and ophthalmology
examination recommended.
2. No acute intracranial findings. Mild chronic small vessel
ischemic changes.

## 2020-05-28 ENCOUNTER — Ambulatory Visit: Payer: BC Managed Care – PPO | Admitting: Endocrinology

## 2020-05-28 ENCOUNTER — Encounter: Payer: Self-pay | Admitting: Endocrinology

## 2020-05-28 ENCOUNTER — Other Ambulatory Visit: Payer: Self-pay

## 2020-05-28 VITALS — BP 142/96 | HR 86

## 2020-05-28 DIAGNOSIS — E1169 Type 2 diabetes mellitus with other specified complication: Secondary | ICD-10-CM | POA: Diagnosis not present

## 2020-05-28 DIAGNOSIS — D352 Benign neoplasm of pituitary gland: Secondary | ICD-10-CM

## 2020-05-28 DIAGNOSIS — E782 Mixed hyperlipidemia: Secondary | ICD-10-CM | POA: Diagnosis not present

## 2020-05-28 DIAGNOSIS — D638 Anemia in other chronic diseases classified elsewhere: Secondary | ICD-10-CM | POA: Diagnosis not present

## 2020-05-28 DIAGNOSIS — E669 Obesity, unspecified: Secondary | ICD-10-CM | POA: Diagnosis not present

## 2020-05-28 LAB — BASIC METABOLIC PANEL
BUN: 66 mg/dL — ABNORMAL HIGH (ref 6–23)
CO2: 21 mEq/L (ref 19–32)
Calcium: 8.9 mg/dL (ref 8.4–10.5)
Chloride: 107 mEq/L (ref 96–112)
Creatinine, Ser: 3.47 mg/dL — ABNORMAL HIGH (ref 0.40–1.50)
GFR: 18.2 mL/min — ABNORMAL LOW (ref >60.00)
Glucose, Bld: 122 mg/dL — ABNORMAL HIGH (ref 70–99)
Potassium: 3.8 mEq/L (ref 3.5–5.1)
Sodium: 137 mEq/L (ref 135–145)

## 2020-05-28 LAB — CBC
HCT: 28 % — ABNORMAL LOW (ref 39.0–52.0)
Hemoglobin: 9.6 g/dL — ABNORMAL LOW (ref 13.0–17.0)
MCHC: 34.1 g/dL (ref 30.0–36.0)
MCV: 90.6 fl (ref 78.0–100.0)
Platelets: 147 10*3/uL — ABNORMAL LOW (ref 150.0–400.0)
RBC: 3.09 Mil/uL — ABNORMAL LOW (ref 4.22–5.81)
RDW: 12.8 % (ref 11.5–15.5)
WBC: 3.9 10*3/uL — ABNORMAL LOW (ref 4.0–10.5)

## 2020-05-28 LAB — LIPID PANEL
Cholesterol: 113 mg/dL (ref 0–200)
HDL: 32.5 mg/dL — ABNORMAL LOW (ref >39.00)
LDL Cholesterol: 67 mg/dL (ref 0–99)
NonHDL: 80.03
Total CHOL/HDL Ratio: 3
Triglycerides: 66 mg/dL (ref 0.0–149.0)
VLDL: 13.2 mg/dL (ref 0.0–40.0)

## 2020-05-28 LAB — POCT GLYCOSYLATED HEMOGLOBIN (HGB A1C): Hemoglobin A1C: 5.2 % (ref 4.0–5.6)

## 2020-05-28 LAB — TESTOSTERONE: Testosterone: 293.01 ng/dL — ABNORMAL LOW (ref 300.00–890.00)

## 2020-05-28 NOTE — Progress Notes (Signed)
Patient ID: Roberto [name], male   DOB: 1960-12-01, 60 y.o.   MRN: [id number]   Chief complaint: Follow-up of multiple endocrine problems  History of Present Illness:  PROBLEM 1: Pituitary tumor  His MRI on 07/18/14 showed a pituitary tumor with the following description: 2.3 x 2 x 1.8 cm mass centered in the sella with suprasellar extension and cavernous sinus extension greater on the left with an appearance most suggestive of pituitary macroadenoma. This impresses upon the optic chiasm  Had difficulty with peripheral vision on the left side but this resolved Visual difficulties recently have been unrelated to the tumor   MRI in 2/17 showing focal area of delayed enhancement this represents a pituitary adenoma on the left.  This lesion measures 15 x 12 x 11 mm.   Has had a follow-up MRI in 10/2019 which showed the following  . Decreased size of mass along the left aspect of the sella turcica. Residual soft tissue along the sellar floor measures approximately 11 x 5 mm. No mass effect on the optic chiasm.   PROBLEM 2:  Hyperprolactinemia:  At baseline he had a prolactin level checked and this was markedly increased at 1312 He had symptoms of decreased libido and gynecomastia Testosterone level at baseline was low at 70  He  was started on Dostinex 0.5 mg initially half tablet twice a week and then 1 tablet twice a week on his initial consultation on 08/04/14 He is tolerating the Dostinex without any nausea. Prolactin has been subsequently normal since 09/2014    He is still taking half tablet of Dostinex twice a week on Mondays and Fridays  His prolactin was last checked in 7/21 and was normal, was relatively higher in 12/20  No recent headaches  Lab Results  Component Value Date   PROLACTIN 5.0 09/29/2019   PROLACTIN 6.9 04/15/2019   PROLACTIN 20.3 (H) 03/18/2019   PROLACTIN 7.8 09/22/2018   PROLACTIN 3.7 (L) 12/21/2017   . HYPOGONADISM:   This was mostly  transient after initial diagnosis of large prolactinoma  No unusual fatigue His last testosterone level was done late afternoon and not fasting   Lab Results  Component Value Date   TESTOSTERONE 245 (L) 03/18/2019   THYROID function:  free T4 consistently normal  Lab Results  Component Value Date   TSH 0.404 07/18/2014   FREET4 1.16 04/15/2019   FREET4 0.90 01/30/2017   FREET4 0.80 10/07/2016     PROBLEM 3:  DIABETES type II with obesity    He was diagnosed  in 2014  and has been Previously managed by a primary care physician who he works with. On admission to the hospital in 2016 his glucose was 380 without any evidence of ketosis  Highest A1c had been 8%, subsequently in the normal range since 11/18  A1c is previously 4.7 and now 5.2, he does have significant anemia  He does not bring his monitor for download as before  NON-insulin hypoglycemic drugs: None  Current management, blood sugar patterns and problems identified:  He has been able to keep his weight regulated with diet  He tries to exercise with resistance exercises since he has difficulty with walking because of visual difficulties  Blood sugars checked randomly show the following range: 120-140, usually not doing fasting  Last visit with dietitian: 01/2018  Weight history: Recent weight about 232   Wt Readings from Last 3 Encounters:  04/15/19 239 lb 9.6 oz (108.7 kg)  03/21/19 237 lb 11.2 oz (  107.8 kg)  09/24/18 239 lb (108.4 kg)    Lab Results  Component Value Date   HGBA1C 5.2 05/28/2020   HGBA1C 6.0 (H) 03/18/2019   HGBA1C 5.8 09/22/2018   Lab Results  Component Value Date   MICROALBUR 90.6 (H) 10/10/2016   LDLCALC 60 03/18/2019   CREATININE 3.61 (H) 03/26/2019    OTHER active problems and review of systems    Past Medical History:  Diagnosis Date  . Anemia   . Detached retina   . Diabetes mellitus without complication ([place])   . H/O hypogonadism   . Hypertension   .  Prolactinoma, benign [facility])     Past Surgical History:  Procedure Laterality Date  . [place]  . NO PAST SURGERIES      Family History  Problem Relation Age of Onset  . Diabetes Mother   . Colon polyps Mother   . Heart disease Mother   . Hypertension Mother   . Colon cancer Father   . Kidney disease Maternal Grandmother   . Colon cancer Maternal Grandfather   . Esophageal cancer Maternal Uncle   . Colon cancer Maternal Uncle     Social History:  reports that he has never smoked. He has never used smokeless tobacco. He reports that he does not drink alcohol and does not use drugs.  Allergies:  Allergies  Allergen Reactions  . Contrast Media [Iodinated Diagnostic Agents] Rash    Allergies as of 05/28/2020      Reactions   Contrast Media [iodinated Diagnostic Agents] Rash      Medication List       Accurate as of May 28, 2020  1:58 PM. If you have any questions, ask your nurse or doctor.        STOP taking these medications   glipiZIDE 5 MG tablet Commonly known as: GLUCOTROL Stopped by: Elayne Snare, MD     TAKE these medications   amLODipine 10 MG tablet Commonly known as: NORVASC Take 5 mg by mouth at bedtime.   aspirin EC 81 MG tablet Take 81 mg by mouth daily.   cabergoline 0.5 MG tablet Commonly known as: DOSTINEX TAKE 1/2 TABLET TWICE A WEEK   carvedilol 25 MG tablet Commonly known as: COREG Take 25 mg by mouth 2 (two) times daily with a meal.   cholecalciferol 1000 units tablet Commonly known as: VITAMIN D Take 2,000 Units by mouth daily.   hydrALAZINE 50 MG tablet Commonly known as: APRESOLINE Take 75 mg by mouth 3 (three) times daily. Take 1.5 tablets by mouth three times daily.   isosorbide mononitrate 60 MG 24 hr tablet Commonly known as: IMDUR Take 60 mg by mouth daily.   timolol 0.25 % ophthalmic solution Commonly known as: BETIMOL Place 1-2 drops into both eyes 2 (two) times daily.   Vitamin D  (Ergocalciferol) 1.25 MG (50000 UNIT) Caps capsule Commonly known as: DRISDOL TAKE 1 CAPSULE ONCE A WEEK FOR 12 WEEKS, THEN TAKE 1 CAPSULE ONCE A MONTH       LABS:  Office Visit on 05/28/2020  Component Date Value Ref Range Status  . Hemoglobin A1C 05/28/2020 5.2  4.0 - 5.6 % Final     REVIEW OF SYSTEMS:        Eyes: He has multiple problems including vitreous hemorrhage, glaucoma with decreased vision bilaterally  No history of hyperlipidemia  Lab Results  Component Value Date   CHOL 112 03/18/2019   HDL 36 (L) 03/18/2019  [place] 60 03/18/2019   TRIG 82 03/18/2019   CHOLHDL 3.1 03/18/2019     Hypertension: has had a long history of high blood pressure   Currently on regimen of amlodipine 10 mg, carvedilol and hydralazine 50  He is treated by nephrologist  Monitoring at home and blood pressure has been normal, mostly 962/83M systolic Tends to have higher blood pressure readings in the office  BP Readings from Last 3 Encounters:  05/28/20 (!) 142/96  09/29/19 (!) 194/96  04/15/19 (!) 144/70     NEPHROPATHY: He has persistent increase in microalbumin Has had renal biopsy Currently waiting for renal transplant   Followed by nephrologist with creatinine history as follows: Most recent creatinine was 3.2   Lab Results  Component Value Date   CREATININE 3.61 (H) 03/26/2019   CREATININE 3.97 (H) 03/20/2019   CREATININE 3.61 (H) 03/18/2019   CREATININE 2.95 (H) 09/22/2018    No symptoms of NEUROPATHY: Last foot exam 08/2018    VITAMIN D deficiency: This was done as a screening and at baseline was low at 5.8.   Has been on 3000U supplements   Lab Results  Component Value Date   VD25OH 42.95 09/29/2019   VD25OH 42.6 03/18/2019   VD25OH 12.24 (L) 09/22/2018     Lab Results  Component Value Date   CALCIUM 9.1 03/26/2019     PHYSICAL EXAM:  BP (!) 142/96   Pulse 86   SpO2 98%      ASSESSMENT/PLAN:   DIABETES type II,  non--insulin-requiring with mild obesity  See history of present illness for detailed discussion of current diabetes management, blood sugar patterns and problems identified  His A1c is normal although may be falsely low because of anemia Today it is 5.2  He is not on any medications now We will continue to watch his diet and exercise as much as he can   Macroprolactinoma of the pituitary gland with very high baseline prolactin level of 1315 This has been treated successfully with Dostinex and is still taking 0.25 mg twice a week with usually normalization of prolactin MRI showed reduced tumor on the last exam  Prolactin level to be checked today   HYPOGONADISM:  This was present only at baseline when he had pituitary tumor diagnosis We will recheck today  CKD: Followed by nephrologist and he is requesting follow-up labs Also to check for his chronic anemia   HYPERTENSION: Followed by nephrologist, also monitored by patient at home Blood pressure higher today but usually better at home   Vitamin D deficiency: Adequately replaced on supplements   Follow-up in 4 months     Daleen Steinhaus 05/28/2020, 1:58 PM   Note: This office note was prepared with Dragon voice recognition system technology. Any transcriptional errors that result from this process are unintentional.

## 2020-05-29 LAB — PROLACTIN: Prolactin: 19.3 ng/mL — ABNORMAL HIGH (ref 4.0–15.2)

## 2020-05-29 LAB — FRUCTOSAMINE: Fructosamine: 309 umol/L — ABNORMAL HIGH (ref 0–285)

## 2020-06-05 DIAGNOSIS — E119 Type 2 diabetes mellitus without complications: Secondary | ICD-10-CM | POA: Diagnosis not present

## 2020-06-05 DIAGNOSIS — I10 Essential (primary) hypertension: Secondary | ICD-10-CM | POA: Diagnosis not present

## 2020-06-05 DIAGNOSIS — N184 Chronic kidney disease, stage 4 (severe): Secondary | ICD-10-CM | POA: Diagnosis not present

## 2020-06-05 DIAGNOSIS — N2581 Secondary hyperparathyroidism of renal origin: Secondary | ICD-10-CM | POA: Diagnosis not present

## 2020-06-14 DIAGNOSIS — Z01818 Encounter for other preprocedural examination: Secondary | ICD-10-CM | POA: Diagnosis not present

## 2020-06-14 DIAGNOSIS — I517 Cardiomegaly: Secondary | ICD-10-CM | POA: Diagnosis not present

## 2020-06-20 DIAGNOSIS — H4053X4 Glaucoma secondary to other eye disorders, bilateral, indeterminate stage: Secondary | ICD-10-CM | POA: Diagnosis not present

## 2020-06-20 DIAGNOSIS — H4051X3 Glaucoma secondary to other eye disorders, right eye, severe stage: Secondary | ICD-10-CM | POA: Diagnosis not present

## 2020-06-20 DIAGNOSIS — H4052X3 Glaucoma secondary to other eye disorders, left eye, severe stage: Secondary | ICD-10-CM | POA: Diagnosis not present

## 2020-06-26 DIAGNOSIS — Z23 Encounter for immunization: Secondary | ICD-10-CM | POA: Diagnosis not present

## 2020-06-28 DIAGNOSIS — H4051X3 Glaucoma secondary to other eye disorders, right eye, severe stage: Secondary | ICD-10-CM | POA: Diagnosis not present

## 2020-06-28 DIAGNOSIS — H3321 Serous retinal detachment, right eye: Secondary | ICD-10-CM | POA: Diagnosis not present

## 2020-06-28 DIAGNOSIS — E11311 Type 2 diabetes mellitus with unspecified diabetic retinopathy with macular edema: Secondary | ICD-10-CM | POA: Diagnosis not present

## 2020-06-28 DIAGNOSIS — H33002 Unspecified retinal detachment with retinal break, left eye: Secondary | ICD-10-CM | POA: Diagnosis not present

## 2020-07-05 DIAGNOSIS — Z01818 Encounter for other preprocedural examination: Secondary | ICD-10-CM | POA: Diagnosis not present

## 2020-08-16 DIAGNOSIS — H3321 Serous retinal detachment, right eye: Secondary | ICD-10-CM | POA: Diagnosis not present

## 2020-08-16 DIAGNOSIS — H4051X3 Glaucoma secondary to other eye disorders, right eye, severe stage: Secondary | ICD-10-CM | POA: Diagnosis not present

## 2020-08-16 DIAGNOSIS — H33002 Unspecified retinal detachment with retinal break, left eye: Secondary | ICD-10-CM | POA: Diagnosis not present

## 2020-08-22 DIAGNOSIS — H4053X4 Glaucoma secondary to other eye disorders, bilateral, indeterminate stage: Secondary | ICD-10-CM | POA: Diagnosis not present

## 2020-08-22 DIAGNOSIS — H4051X3 Glaucoma secondary to other eye disorders, right eye, severe stage: Secondary | ICD-10-CM | POA: Diagnosis not present

## 2020-08-22 DIAGNOSIS — H4052X3 Glaucoma secondary to other eye disorders, left eye, severe stage: Secondary | ICD-10-CM | POA: Diagnosis not present

## 2020-10-08 DIAGNOSIS — N184 Chronic kidney disease, stage 4 (severe): Secondary | ICD-10-CM | POA: Diagnosis not present

## 2020-10-08 DIAGNOSIS — N2581 Secondary hyperparathyroidism of renal origin: Secondary | ICD-10-CM | POA: Diagnosis not present

## 2020-10-08 DIAGNOSIS — E119 Type 2 diabetes mellitus without complications: Secondary | ICD-10-CM | POA: Diagnosis not present

## 2020-10-08 DIAGNOSIS — I10 Essential (primary) hypertension: Secondary | ICD-10-CM | POA: Diagnosis not present

## 2020-10-11 DIAGNOSIS — E11311 Type 2 diabetes mellitus with unspecified diabetic retinopathy with macular edema: Secondary | ICD-10-CM | POA: Diagnosis not present

## 2020-10-11 DIAGNOSIS — H3321 Serous retinal detachment, right eye: Secondary | ICD-10-CM | POA: Diagnosis not present

## 2020-10-11 DIAGNOSIS — H4051X3 Glaucoma secondary to other eye disorders, right eye, severe stage: Secondary | ICD-10-CM | POA: Diagnosis not present

## 2020-11-03 ENCOUNTER — Other Ambulatory Visit: Payer: Self-pay | Admitting: Endocrinology

## 2020-11-08 DIAGNOSIS — E103511 Type 1 diabetes mellitus with proliferative diabetic retinopathy with macular edema, right eye: Secondary | ICD-10-CM | POA: Diagnosis not present

## 2020-11-08 DIAGNOSIS — H4020X3 Unspecified primary angle-closure glaucoma, severe stage: Secondary | ICD-10-CM | POA: Diagnosis not present

## 2020-11-08 DIAGNOSIS — H4051X3 Glaucoma secondary to other eye disorders, right eye, severe stage: Secondary | ICD-10-CM | POA: Diagnosis not present

## 2020-11-08 DIAGNOSIS — H3321 Serous retinal detachment, right eye: Secondary | ICD-10-CM | POA: Diagnosis not present

## 2020-11-12 DIAGNOSIS — H4053X3 Glaucoma secondary to other eye disorders, bilateral, severe stage: Secondary | ICD-10-CM | POA: Diagnosis not present

## 2020-11-27 ENCOUNTER — Other Ambulatory Visit: Payer: BC Managed Care – PPO

## 2020-11-27 ENCOUNTER — Other Ambulatory Visit: Payer: Self-pay | Admitting: Endocrinology

## 2020-11-30 ENCOUNTER — Ambulatory Visit: Payer: BC Managed Care – PPO | Admitting: Endocrinology

## 2020-12-28 ENCOUNTER — Other Ambulatory Visit: Payer: Self-pay | Admitting: Endocrinology

## 2020-12-28 DIAGNOSIS — D352 Benign neoplasm of pituitary gland: Secondary | ICD-10-CM

## 2020-12-28 DIAGNOSIS — E1169 Type 2 diabetes mellitus with other specified complication: Secondary | ICD-10-CM

## 2020-12-31 ENCOUNTER — Other Ambulatory Visit: Payer: BC Managed Care – PPO

## 2021-01-04 ENCOUNTER — Ambulatory Visit: Payer: BC Managed Care – PPO | Admitting: Endocrinology

## 2021-01-07 ENCOUNTER — Ambulatory Visit: Payer: BC Managed Care – PPO | Admitting: Endocrinology

## 2021-01-10 DIAGNOSIS — H4051X3 Glaucoma secondary to other eye disorders, right eye, severe stage: Secondary | ICD-10-CM | POA: Diagnosis not present

## 2021-02-26 ENCOUNTER — Other Ambulatory Visit: Payer: BC Managed Care – PPO

## 2021-03-01 ENCOUNTER — Ambulatory Visit: Payer: BC Managed Care – PPO | Admitting: Endocrinology

## 2021-03-04 DIAGNOSIS — H4053X4 Glaucoma secondary to other eye disorders, bilateral, indeterminate stage: Secondary | ICD-10-CM | POA: Diagnosis not present

## 2021-04-02 DIAGNOSIS — I10 Essential (primary) hypertension: Secondary | ICD-10-CM | POA: Diagnosis not present

## 2021-04-02 DIAGNOSIS — N2581 Secondary hyperparathyroidism of renal origin: Secondary | ICD-10-CM | POA: Diagnosis not present

## 2021-04-02 DIAGNOSIS — E119 Type 2 diabetes mellitus without complications: Secondary | ICD-10-CM | POA: Diagnosis not present

## 2021-04-02 DIAGNOSIS — N184 Chronic kidney disease, stage 4 (severe): Secondary | ICD-10-CM | POA: Diagnosis not present

## 2021-04-02 DIAGNOSIS — N189 Chronic kidney disease, unspecified: Secondary | ICD-10-CM | POA: Diagnosis not present

## 2021-04-03 ENCOUNTER — Other Ambulatory Visit: Payer: BC Managed Care – PPO

## 2021-04-08 ENCOUNTER — Ambulatory Visit: Payer: BC Managed Care – PPO | Admitting: Endocrinology

## 2021-04-11 DIAGNOSIS — H4051X3 Glaucoma secondary to other eye disorders, right eye, severe stage: Secondary | ICD-10-CM | POA: Diagnosis not present

## 2021-04-11 DIAGNOSIS — E103513 Type 1 diabetes mellitus with proliferative diabetic retinopathy with macular edema, bilateral: Secondary | ICD-10-CM | POA: Diagnosis not present

## 2021-04-11 DIAGNOSIS — H3582 Retinal ischemia: Secondary | ICD-10-CM | POA: Diagnosis not present

## 2021-04-12 ENCOUNTER — Other Ambulatory Visit (INDEPENDENT_AMBULATORY_CARE_PROVIDER_SITE_OTHER): Payer: BC Managed Care – PPO

## 2021-04-12 ENCOUNTER — Other Ambulatory Visit: Payer: Self-pay

## 2021-04-12 DIAGNOSIS — D352 Benign neoplasm of pituitary gland: Secondary | ICD-10-CM | POA: Diagnosis not present

## 2021-04-12 DIAGNOSIS — E1169 Type 2 diabetes mellitus with other specified complication: Secondary | ICD-10-CM | POA: Diagnosis not present

## 2021-04-12 DIAGNOSIS — E669 Obesity, unspecified: Secondary | ICD-10-CM

## 2021-04-12 LAB — COMPREHENSIVE METABOLIC PANEL
ALT: 11 U/L (ref 0–53)
AST: 13 U/L (ref 0–37)
Albumin: 3.6 g/dL (ref 3.5–5.2)
Alkaline Phosphatase: 73 U/L (ref 39–117)
BUN: 43 mg/dL — ABNORMAL HIGH (ref 6–23)
CO2: 23 mEq/L (ref 19–32)
Calcium: 8.5 mg/dL (ref 8.4–10.5)
Chloride: 107 mEq/L (ref 96–112)
Creatinine, Ser: 4.52 mg/dL (ref 0.40–1.50)
GFR: 13.17 mL/min — CL (ref >60.00)
Glucose, Bld: 131 mg/dL — ABNORMAL HIGH (ref 70–99)
Potassium: 3.9 mEq/L (ref 3.5–5.1)
Sodium: 140 mEq/L (ref 135–145)
Total Bilirubin: 0.3 mg/dL (ref 0.2–1.2)
Total Protein: 6.3 g/dL (ref 6.0–8.3)

## 2021-04-12 LAB — T4, FREE: Free T4: 0.96 ng/dL (ref 0.60–1.60)

## 2021-04-12 LAB — TESTOSTERONE: Testosterone: 291.58 ng/dL — ABNORMAL LOW (ref 300.00–890.00)

## 2021-04-12 LAB — HEMOGLOBIN A1C: Hgb A1c MFr Bld: 5.3 % (ref 4.6–6.5)

## 2021-04-13 LAB — PROLACTIN: Prolactin: 6.3 ng/mL (ref 4.0–15.2)

## 2021-04-13 LAB — FRUCTOSAMINE: Fructosamine: 323 umol/L — ABNORMAL HIGH (ref 0–285)

## 2021-04-16 ENCOUNTER — Other Ambulatory Visit: Payer: Self-pay

## 2021-04-16 ENCOUNTER — Encounter: Payer: Self-pay | Admitting: Endocrinology

## 2021-04-16 ENCOUNTER — Ambulatory Visit: Payer: BC Managed Care – PPO | Admitting: Endocrinology

## 2021-04-16 VITALS — BP 142/78 | HR 74 | Ht 72.0 in | Wt 240.0 lb

## 2021-04-16 DIAGNOSIS — E669 Obesity, unspecified: Secondary | ICD-10-CM

## 2021-04-16 DIAGNOSIS — E1169 Type 2 diabetes mellitus with other specified complication: Secondary | ICD-10-CM | POA: Diagnosis not present

## 2021-04-16 DIAGNOSIS — D352 Benign neoplasm of pituitary gland: Secondary | ICD-10-CM

## 2021-04-16 NOTE — Progress Notes (Signed)
Patient ID: Roberto [name], male   DOB: December 19, 1960, 61 y.o.   MRN: [id number]   Chief complaint: Follow-up of multiple endocrine problems  History of Present Illness:  PROBLEM 1: Pituitary tumor  His MRI on 07/18/14 showed a pituitary tumor with the following description: 2.3 x 2 x 1.8 cm mass centered in the sella with suprasellar extension and cavernous sinus extension greater on the left with an appearance most suggestive of pituitary macroadenoma. This impresses upon the optic chiasm  Had difficulty with peripheral vision on the left side but this resolved Visual difficulties recently have been unrelated to the tumor   MRI in 2/17 showing focal area of delayed enhancement this represents a pituitary adenoma on the left.  This lesion measures 15 x 12 x 11 mm.   Has had a follow-up MRI in 10/2019 which showed the following  . Decreased size of mass along the left aspect of the sella turcica. Residual soft tissue along the sellar floor measures approximately 11 x 5 mm. No mass effect on the optic chiasm.   PROBLEM 2:  Hyperprolactinemia:  At baseline he had a prolactin level checked and this was markedly increased at 1312 He had symptoms of decreased libido and gynecomastia Testosterone level at baseline was low at 70  He  was started on Dostinex 0.5 mg initially half tablet twice a week and then 1 tablet twice a week on his initial consultation on 08/04/14 He has been taking this regularly without any side effects like nausea   He is since his last visit taking half tablet of Dostinex alternating with 1 tablet a week on Mondays and Fridays  His prolactin was previously increasing at 19 before the dose increase It is back to normal now  No recent headaches  Lab Results  Component Value Date   PROLACTIN 6.3 04/12/2021   PROLACTIN 19.3 (H) 05/28/2020   PROLACTIN 5.0 09/29/2019   PROLACTIN 6.9 04/15/2019   PROLACTIN 20.3 (H) 03/18/2019   . HYPOGONADISM:   This  was mostly transient after initial diagnosis of large prolactinoma  Overall feels good with his energy level His testosterone level was done in the afternoon and not fasting   Lab Results  Component Value Date   TESTOSTERONE 291.58 (L) 04/12/2021   THYROID function:  free T4 consistently normal  Lab Results  Component Value Date   TSH 0.404 07/18/2014   FREET4 0.96 04/12/2021   FREET4 1.16 04/15/2019   FREET4 0.90 01/30/2017     PROBLEM 3:  DIABETES type II with obesity    He was diagnosed  in 2014  and has been Previously managed by a primary care physician who he works with. On admission to the hospital in 2016 his glucose was 380 without any evidence of ketosis  Highest A1c had been 8%, subsequently in the normal range since 11/18  A1c is still normal at 5.3, he does have history of anemia  He does not bring his monitor for download as before  NON-insulin hypoglycemic drugs: None  Current management, blood sugar patterns and problems identified: He has been having consistent control without medications. Able to keep his weight stable with watching his diet, now trying to be more vegetarian He tries to exercise with resistance exercises or going up steps since he has difficulty with walking because of visual difficulties Nonfasting lab glucose was 131  Blood sugars checked at home have been: 100-120, not higher after meals also  Last visit with dietitian: 01/2018  Weight history: Recent weight about 232   Wt Readings from Last 3 Encounters:  04/16/21 240 lb (108.9 kg)  04/15/19 239 lb 9.6 oz (108.7 kg)  03/21/19 237 lb 11.2 oz (107.8 kg)    Lab Results  Component Value Date   HGBA1C 5.3 04/12/2021   HGBA1C 5.2 05/28/2020   HGBA1C 6.0 (H) 03/18/2019   Lab Results  Component Value Date   MICROALBUR 90.6 (H) 10/10/2016   LDLCALC 67 05/28/2020   CREATININE 4.52 (HH) 04/12/2021    OTHER active problems and review of systems    Past Medical History:   Diagnosis Date   Anemia    Detached retina    Diabetes mellitus without complication ([place])    H/O hypogonadism    Hypertension    Prolactinoma, benign (HCC)     Past Surgical History:  Procedure Laterality Date   KIDNEY STONE SURGERY  1987   NO PAST SURGERIES      Family History  Problem Relation Age of Onset   Diabetes Mother    Colon polyps Mother    Heart disease Mother    Hypertension Mother    Colon cancer Father    Kidney disease Maternal Grandmother    Colon cancer Maternal Grandfather    Esophageal cancer Maternal Uncle    Colon cancer Maternal Uncle     Social History:  reports that he has never smoked. He has never used smokeless tobacco. He reports that he does not drink alcohol and does not use drugs.  Allergies:  Allergies  Allergen Reactions   Contrast Media [Iodinated Contrast Media] Rash    Allergies as of 04/16/2021       Reactions   Contrast Media [iodinated Contrast Media] Rash        Medication List        Accurate as of April 16, 2021  4:05 PM. If you have any questions, ask your nurse or doctor.          amLODipine 10 MG tablet Commonly known as: NORVASC Take 5 mg by mouth at bedtime.   aspirin EC 81 MG tablet Take 81 mg by mouth daily.   cabergoline 0.5 MG tablet Commonly known as: DOSTINEX TAKE 1/2 TABLET TWICE A WEEK   carvedilol 25 MG tablet Commonly known as: COREG Take 25 mg by mouth 2 (two) times daily with a meal.   cholecalciferol 1000 units tablet Commonly known as: VITAMIN D Take 2,000 Units by mouth daily.   hydrALAZINE 50 MG tablet Commonly known as: APRESOLINE Take 25 mg by mouth 3 (three) times daily. Take 1.5 tablets by mouth three times daily.   isosorbide mononitrate 60 MG 24 hr tablet Commonly known as: IMDUR Take 60 mg by mouth daily.   timolol 0.25 % ophthalmic solution Commonly known as: BETIMOL Place 1-2 drops into both eyes 2 (two) times daily.   Vitamin D (Ergocalciferol) 1.25 MG  (50000 UNIT) Caps capsule Commonly known as: DRISDOL Take 1 capsule (50,000 Units total) by mouth every 7 (seven) days.        LABS:  Lab on 04/12/2021  Component Date Value Ref Range Status   Fructosamine 04/12/2021 323 (H)  0 - 285 umol/L Final   Comment: Published reference interval for apparently healthy subjects between age 53 and 69 is 100 - 285 umol/L and in a poorly controlled diabetic population is 228 - 563 umol/L with a mean of 396 umol/L.    Prolactin 04/12/2021 6.3  4.0 - 15.2 ng/mL Final  Testosterone 04/12/2021 291.58 (L)  300.00 - 890.00 ng/dL Final   Free T4 04/12/2021 0.96  0.60 - 1.60 ng/dL Final   Comment: Specimens from patients who are undergoing biotin therapy and /or ingesting biotin supplements may contain high levels of biotin.  The higher biotin concentration in these specimens interferes with this Free T4 assay.  Specimens that contain high levels  of biotin may cause false high results for this Free T4 assay.  Please interpret results in light of the total clinical presentation of the patient.     Sodium 04/12/2021 140  135 - 145 mEq/L Final   Potassium 04/12/2021 3.9  3.5 - 5.1 mEq/L Final   Chloride 04/12/2021 107  96 - 112 mEq/L Final   CO2 04/12/2021 23  19 - 32 mEq/L Final   Glucose, Bld 04/12/2021 131 (H)  70 - 99 mg/dL Final   BUN 04/12/2021 43 (H)  6 - 23 mg/dL Final   Creatinine, Ser 04/12/2021 4.52 (HH)  0.40 - 1.50 mg/dL Final   Total Bilirubin 04/12/2021 0.3  0.2 - 1.2 mg/dL Final   Alkaline Phosphatase 04/12/2021 73  39 - 117 U/L Final   AST 04/12/2021 13  0 - 37 U/L Final   ALT 04/12/2021 11  0 - 53 U/L Final   Total Protein 04/12/2021 6.3  6.0 - 8.3 g/dL Final   Albumin 04/12/2021 3.6  3.5 - 5.2 g/dL Final   GFR 04/12/2021 13.17 (LL)  >60.00 mL/min Final   Calculated using the CKD-EPI Creatinine Equation (2021)   Calcium 04/12/2021 8.5  8.4 - 10.5 mg/dL Final   Hgb A1c MFr Bld 04/12/2021 5.3  4.6 - 6.5 % Final   Glycemic Control  Guidelines for People with Diabetes:Non Diabetic:  <6%Goal of Therapy: <7%Additional Action Suggested:  >8%      REVIEW OF SYSTEMS:        Eyes: He has multiple problems including vitreous hemorrhage, glaucoma with decreased vision bilaterally  No history of hyperlipidemia  Lab Results  Component Value Date   CHOL 113 05/28/2020   HDL 32.50 (L) 05/28/2020   LDLCALC 67 05/28/2020   TRIG 66.0 05/28/2020   CHOLHDL 3 05/28/2020     Hypertension: has had a long history of high blood pressure   Currently on regimen of amlodipine 10 mg, carvedilol and hydralazine 50  He is treated by nephrologist  Monitoring at home and blood pressure has been normal, mostly 354/56Y systolic Tends to have higher blood pressure readings in the office  BP Readings from Last 3 Encounters:  04/16/21 (!) 142/78  05/28/20 (!) 142/96  09/29/19 (!) 194/96     NEPHROPATHY: He has persistent increase in microalbumin Has had renal biopsy Is also waiting for renal transplant   Followed by nephrologist with creatinine history as follows: Most recent creatinine:   Lab Results  Component Value Date   CREATININE 4.52 (HH) 04/12/2021   CREATININE 3.47 (H) 05/28/2020   CREATININE 3.61 (H) 03/26/2019   CREATININE 3.97 (H) 03/20/2019    No symptoms of NEUROPATHY: Last foot exam 08/2018    VITAMIN D deficiency: This was done as a screening and at baseline was low at 5.8.   Has been on 3000U supplements   Lab Results  Component Value Date   VD25OH 42.95 09/29/2019   VD25OH 42.6 03/18/2019   VD25OH 12.24 (L) 09/22/2018     Lab Results  Component Value Date   CALCIUM 8.5 04/12/2021     PHYSICAL EXAM:  BP Marland Kitchen)  142/78 (BP Location: Left Arm, Patient Position: Sitting, Cuff Size: Normal)    Pulse 74    Ht 6' (1.829 m)    Wt 240 lb (108.9 kg)    SpO2 96%    BMI 32.55 kg/m      ASSESSMENT/PLAN:   DIABETES type II, non--insulin-requiring with mild obesity  See history of present illness for  detailed discussion of current diabetes management, blood sugar patterns and problems identified  His A1c is normal although may be falsely low because of anemia Blood sugars appear normal at home  He is not on any medications since onset of significant renal failure He is doing well with lifestyle changes and encouraged him to try using the treadmill if he can for exercise   Macroprolactinoma of the pituitary gland with very high baseline prolactin level of 1315 This has been treated successfully with Dostinex and is taking 0.25 mg alternating with 0.5 mg a week with normalization of prolactin  MRI showed reduced tumor on the last exam   HYPOGONADISM:  This was present only at baseline when he had pituitary tumor diagnosis His testosterone level is slightly low but again checked in the early afternoon and not fasting  CKD: Followed by nephrologist, appears to have significant increase in creatinine recently  No other evidence of hypopituitarism   Follow-up in 6 months     Obryan Radu Dwyane Dee 04/16/2021, 4:05 PM   Note: This office note was prepared with Dragon voice recognition system technology. Any transcriptional errors that result from this process are unintentional.

## 2021-04-22 ENCOUNTER — Encounter (HOSPITAL_COMMUNITY): Payer: BC Managed Care – PPO

## 2021-05-03 DIAGNOSIS — N184 Chronic kidney disease, stage 4 (severe): Secondary | ICD-10-CM | POA: Diagnosis not present

## 2021-05-13 ENCOUNTER — Encounter (HOSPITAL_COMMUNITY): Payer: Self-pay

## 2021-05-13 ENCOUNTER — Inpatient Hospital Stay (HOSPITAL_COMMUNITY)
Admission: RE | Admit: 2021-05-13 | Discharge: 2021-05-13 | Disposition: A | Discharge status: Home / Self Care | Payer: BC Managed Care – PPO | Source: Ambulatory Visit | Attending: Nephrology | Admitting: Nephrology

## 2021-05-20 DIAGNOSIS — H4053X4 Glaucoma secondary to other eye disorders, bilateral, indeterminate stage: Secondary | ICD-10-CM | POA: Diagnosis not present

## 2021-06-06 ENCOUNTER — Inpatient Hospital Stay (HOSPITAL_COMMUNITY): Admission: RE | Admit: 2021-06-06 | Payer: BC Managed Care – PPO | Source: Ambulatory Visit

## 2021-06-06 DIAGNOSIS — H4051X3 Glaucoma secondary to other eye disorders, right eye, severe stage: Secondary | ICD-10-CM | POA: Diagnosis not present

## 2021-06-10 ENCOUNTER — Encounter (HOSPITAL_COMMUNITY): Payer: BC Managed Care – PPO

## 2021-06-13 ENCOUNTER — Other Ambulatory Visit: Payer: Self-pay

## 2021-06-13 ENCOUNTER — Encounter (HOSPITAL_COMMUNITY)
Admission: RE | Admit: 2021-06-13 | Discharge: 2021-06-13 | Disposition: A | Discharge status: Home / Self Care | Payer: BC Managed Care – PPO | Source: Ambulatory Visit | Attending: Nephrology | Admitting: Nephrology

## 2021-06-13 VITALS — BP 152/69 | HR 72 | Temp 97.5°F | Resp 20

## 2021-06-13 DIAGNOSIS — N184 Chronic kidney disease, stage 4 (severe): Secondary | ICD-10-CM | POA: Diagnosis not present

## 2021-06-13 LAB — POCT HEMOGLOBIN-HEMACUE: Hemoglobin: 8.7 g/dL — ABNORMAL LOW (ref 13.0–17.0)

## 2021-06-13 MED ORDER — EPOETIN ALFA-EPBX 10000 UNIT/ML IJ SOLN
10000.0000 [IU] | INTRAMUSCULAR | Status: DC
  Administered 2021-06-13: 10000 [IU] via SUBCUTANEOUS

## 2021-06-13 MED ORDER — EPOETIN ALFA-EPBX 10000 UNIT/ML IJ SOLN
INTRAMUSCULAR | Status: AC
  Filled 2021-06-13: qty 1

## 2021-07-04 ENCOUNTER — Ambulatory Visit (HOSPITAL_COMMUNITY)
Admission: RE | Admit: 2021-07-04 | Discharge: 2021-07-04 | Disposition: A | Discharge status: Home / Self Care | Payer: BC Managed Care – PPO | Source: Ambulatory Visit | Attending: Nephrology | Admitting: Nephrology

## 2021-07-04 VITALS — BP 175/90 | HR 71 | Temp 98.0°F | Resp 18

## 2021-07-04 DIAGNOSIS — N184 Chronic kidney disease, stage 4 (severe): Secondary | ICD-10-CM | POA: Insufficient documentation

## 2021-07-04 LAB — RENAL FUNCTION PANEL
Albumin: 3.8 g/dL (ref 3.5–5.0)
Anion gap: 8 (ref 5–15)
BUN: 55 mg/dL — ABNORMAL HIGH (ref 8–23)
CO2: 21 mmol/L — ABNORMAL LOW (ref 22–32)
Calcium: 8.7 mg/dL — ABNORMAL LOW (ref 8.9–10.3)
Chloride: 110 mmol/L (ref 98–111)
Creatinine, Ser: 4.78 mg/dL — ABNORMAL HIGH (ref 0.61–1.24)
GFR, Estimated: 13 mL/min — ABNORMAL LOW (ref >60)
Glucose, Bld: 111 mg/dL — ABNORMAL HIGH (ref 70–99)
Phosphorus: 4.6 mg/dL (ref 2.5–4.6)
Potassium: 3.5 mmol/L (ref 3.5–5.1)
Sodium: 139 mmol/L (ref 135–145)

## 2021-07-04 LAB — IRON AND TIBC
Iron: 44 ug/dL — ABNORMAL LOW (ref 45–182)
Saturation Ratios: 20 % (ref 17.9–39.5)
TIBC: 220 ug/dL — ABNORMAL LOW (ref 250–450)
UIBC: 176 ug/dL

## 2021-07-04 LAB — FERRITIN: Ferritin: 354 ng/mL — ABNORMAL HIGH (ref 24–336)

## 2021-07-04 LAB — POCT HEMOGLOBIN-HEMACUE: Hemoglobin: 9.6 g/dL — ABNORMAL LOW (ref 13.0–17.0)

## 2021-07-04 MED ORDER — EPOETIN ALFA-EPBX 10000 UNIT/ML IJ SOLN
INTRAMUSCULAR | Status: AC
  Filled 2021-07-04: qty 1

## 2021-07-04 MED ORDER — EPOETIN ALFA-EPBX 10000 UNIT/ML IJ SOLN
10000.0000 [IU] | INTRAMUSCULAR | Status: DC
  Administered 2021-07-04: 10000 [IU] via SUBCUTANEOUS

## 2021-07-26 ENCOUNTER — Other Ambulatory Visit: Payer: Self-pay | Admitting: Endocrinology

## 2021-08-01 ENCOUNTER — Ambulatory Visit (HOSPITAL_COMMUNITY)
Admission: RE | Admit: 2021-08-01 | Discharge: 2021-08-01 | Disposition: A | Discharge status: Home / Self Care | Payer: BC Managed Care – PPO | Source: Ambulatory Visit | Attending: Nephrology | Admitting: Nephrology

## 2021-08-01 NOTE — Progress Notes (Signed)
Pt arrived 1 hour and 45 minutes late for his retacrit appointment today.  I explained to the patient we have a policy that if a patient arrives more then 30 min late for an appt they will be rescheduled.  I reminded the patient that he was 40 min late for his last appointment and told him our policy of rescheduling at that time, and that he was also 30 minutes late for his appt before that.  Pt stated that "patient care is very important to [place]".  I said, " you are absolutely right, and we have patients scheduled to come in at this time and we want to be able to care for them and run on time, this is not a drop in clinic, it runs by appointment."  Pt stated he wanted to talk to a supervisor.  Dewaine Oats came to speak with patient.  Harriet explained to the patient and his transporter, Lannie Fields, our policy again, told him he had several cancellations, no show, and late arrivals.  She asked if he would like to reschedule for tomorrow afternoon and he said what about next week.  Reino Bellis gave him an appt for next week, gave the appt card to Lannie Fields who was transporting him, and told him the department phone number was on the back if he needed it.   ?

## 2021-08-01 NOTE — Progress Notes (Signed)
Called to interact with Dr. Mayer Masker; Dr. Mayer Masker advised that he was late for his appointment. Dr. Mayer Masker advised that he is visually impaired and that he was not able to read appointment cards; Dr. Mayer Masker assistant [facility]) advised that it may be his fault because he is the one who receives the appointment cards. Dr. Mayer Masker was made aware that he was late for his appointment and we would like to reschedule for tomorrow. Dr. Mayer Masker advised that he was not aware of our policy. The care team says they have pointed this out to him on several occasions. Dr. Mayer Masker. Agreed to reschedule his appointment for Thursday 08/08/2021 at 11 am. Dr. Mayer Masker verbalized this date as her repeated back to me, the card was then handed to [facility]. Tim McKorkle advised he is the one who assist Dr. Mayer Masker with his appointment needs.Dr, Mayer Masker' chart did not have his disability listed on the chart. I was able to add this to the chart to ensure that all providers are aware and we are able to meet his needs.  ?

## 2021-08-08 ENCOUNTER — Ambulatory Visit (HOSPITAL_COMMUNITY)
Admission: RE | Admit: 2021-08-08 | Discharge: 2021-08-08 | Disposition: A | Discharge status: Home / Self Care | Payer: BC Managed Care – PPO | Source: Ambulatory Visit | Attending: Nephrology | Admitting: Nephrology

## 2021-08-08 VITALS — BP 173/93 | HR 69 | Temp 97.7°F | Resp 17

## 2021-08-08 DIAGNOSIS — N184 Chronic kidney disease, stage 4 (severe): Secondary | ICD-10-CM | POA: Diagnosis not present

## 2021-08-08 LAB — IRON AND TIBC
Iron: 42 ug/dL — ABNORMAL LOW (ref 45–182)
Saturation Ratios: 18 % (ref 17.9–39.5)
TIBC: 228 ug/dL — ABNORMAL LOW (ref 250–450)
UIBC: 186 ug/dL

## 2021-08-08 LAB — RENAL FUNCTION PANEL
Albumin: 3.5 g/dL (ref 3.5–5.0)
Anion gap: 9 (ref 5–15)
BUN: 67 mg/dL — ABNORMAL HIGH (ref 8–23)
CO2: 19 mmol/L — ABNORMAL LOW (ref 22–32)
Calcium: 8.6 mg/dL — ABNORMAL LOW (ref 8.9–10.3)
Chloride: 112 mmol/L — ABNORMAL HIGH (ref 98–111)
Creatinine, Ser: 5.32 mg/dL — ABNORMAL HIGH (ref 0.61–1.24)
GFR, Estimated: 11 mL/min — ABNORMAL LOW (ref >60)
Glucose, Bld: 129 mg/dL — ABNORMAL HIGH (ref 70–99)
Phosphorus: 4.9 mg/dL — ABNORMAL HIGH (ref 2.5–4.6)
Potassium: 3.6 mmol/L (ref 3.5–5.1)
Sodium: 140 mmol/L (ref 135–145)

## 2021-08-08 LAB — POCT HEMOGLOBIN-HEMACUE: Hemoglobin: 9.6 g/dL — ABNORMAL LOW (ref 13.0–17.0)

## 2021-08-08 LAB — FERRITIN: Ferritin: 349 ng/mL — ABNORMAL HIGH (ref 24–336)

## 2021-08-08 MED ORDER — EPOETIN ALFA-EPBX 10000 UNIT/ML IJ SOLN
INTRAMUSCULAR | Status: AC
Start: 2021-08-08 — End: 2021-08-08
  Administered 2021-08-08: 10000 [IU] via SUBCUTANEOUS
  Filled 2021-08-08: qty 1

## 2021-08-08 MED ORDER — EPOETIN ALFA-EPBX 10000 UNIT/ML IJ SOLN: 10000.0000 [IU] | INTRAMUSCULAR | Status: DC

## 2021-08-15 DIAGNOSIS — H4051X3 Glaucoma secondary to other eye disorders, right eye, severe stage: Secondary | ICD-10-CM | POA: Diagnosis not present

## 2021-08-22 DIAGNOSIS — H4053X4 Glaucoma secondary to other eye disorders, bilateral, indeterminate stage: Secondary | ICD-10-CM | POA: Diagnosis not present

## 2021-08-28 NOTE — Progress Notes (Signed)
Pt had an appt for 6/1 in epic for retacrit, but according to his orders of retacrit every 4 weeks this was one week too early.  Called patient and let him know I was cancelling that appointment for tomorrow and made an appt for him on Thursday, June 8th and 1100.  Pt verbalized understanding.

## 2021-08-29 ENCOUNTER — Inpatient Hospital Stay (HOSPITAL_COMMUNITY)
Admission: RE | Admit: 2021-08-29 | Discharge: 2021-08-29 | Disposition: A | Discharge status: Home / Self Care | Payer: BC Managed Care – PPO | Source: Ambulatory Visit | Attending: Nephrology | Admitting: Nephrology

## 2021-08-30 ENCOUNTER — Other Ambulatory Visit: Payer: Self-pay | Admitting: Endocrinology

## 2021-09-05 ENCOUNTER — Inpatient Hospital Stay (HOSPITAL_COMMUNITY): Admission: RE | Admit: 2021-09-05 | Payer: BC Managed Care – PPO | Source: Ambulatory Visit

## 2021-09-05 ENCOUNTER — Encounter (HOSPITAL_COMMUNITY): Payer: BC Managed Care – PPO

## 2021-09-09 DIAGNOSIS — I120 Hypertensive chronic kidney disease with stage 5 chronic kidney disease or end stage renal disease: Secondary | ICD-10-CM | POA: Diagnosis not present

## 2021-09-09 DIAGNOSIS — N2581 Secondary hyperparathyroidism of renal origin: Secondary | ICD-10-CM | POA: Diagnosis not present

## 2021-09-09 DIAGNOSIS — E119 Type 2 diabetes mellitus without complications: Secondary | ICD-10-CM | POA: Diagnosis not present

## 2021-09-09 DIAGNOSIS — N185 Chronic kidney disease, stage 5: Secondary | ICD-10-CM | POA: Diagnosis not present

## 2021-09-12 ENCOUNTER — Ambulatory Visit (HOSPITAL_COMMUNITY)
Admission: RE | Admit: 2021-09-12 | Discharge: 2021-09-12 | Disposition: A | Discharge status: Home / Self Care | Payer: BC Managed Care – PPO | Source: Ambulatory Visit | Attending: Nephrology | Admitting: Nephrology

## 2021-09-12 VITALS — BP 176/74 | HR 74 | Temp 98.1°F | Resp 18

## 2021-09-12 DIAGNOSIS — N184 Chronic kidney disease, stage 4 (severe): Secondary | ICD-10-CM | POA: Diagnosis not present

## 2021-09-12 LAB — RENAL FUNCTION PANEL
Albumin: 3.6 g/dL (ref 3.5–5.0)
Anion gap: 10 (ref 5–15)
BUN: 63 mg/dL — ABNORMAL HIGH (ref 8–23)
CO2: 18 mmol/L — ABNORMAL LOW (ref 22–32)
Calcium: 8.6 mg/dL — ABNORMAL LOW (ref 8.9–10.3)
Chloride: 111 mmol/L (ref 98–111)
Creatinine, Ser: 5.49 mg/dL — ABNORMAL HIGH (ref 0.61–1.24)
GFR, Estimated: 11 mL/min — ABNORMAL LOW (ref >60)
Glucose, Bld: 170 mg/dL — ABNORMAL HIGH (ref 70–99)
Phosphorus: 4.6 mg/dL (ref 2.5–4.6)
Potassium: 3.4 mmol/L — ABNORMAL LOW (ref 3.5–5.1)
Sodium: 139 mmol/L (ref 135–145)

## 2021-09-12 LAB — FERRITIN: Ferritin: 359 ng/mL — ABNORMAL HIGH (ref 24–336)

## 2021-09-12 LAB — IRON AND TIBC
Iron: 55 ug/dL (ref 45–182)
Saturation Ratios: 24 % (ref 17.9–39.5)
TIBC: 227 ug/dL — ABNORMAL LOW (ref 250–450)
UIBC: 172 ug/dL

## 2021-09-12 LAB — POCT HEMOGLOBIN-HEMACUE: Hemoglobin: 9.8 g/dL — ABNORMAL LOW (ref 13.0–17.0)

## 2021-09-12 MED ORDER — EPOETIN ALFA-EPBX 10000 UNIT/ML IJ SOLN
INTRAMUSCULAR | Status: AC
  Administered 2021-09-12: 10000 [IU] via SUBCUTANEOUS
  Filled 2021-09-12: qty 1

## 2021-09-12 MED ORDER — EPOETIN ALFA-EPBX 10000 UNIT/ML IJ SOLN: 10000.0000 [IU] | INTRAMUSCULAR | Status: DC

## 2021-09-13 LAB — PTH, INTACT AND CALCIUM
Calcium, Total (PTH): 8.5 mg/dL — ABNORMAL LOW (ref 8.6–10.2)
PTH: 205 pg/mL — ABNORMAL HIGH (ref 15–65)

## 2021-10-03 ENCOUNTER — Encounter (HOSPITAL_COMMUNITY): Payer: BC Managed Care – PPO

## 2021-10-10 ENCOUNTER — Ambulatory Visit (HOSPITAL_COMMUNITY)
Admission: RE | Admit: 2021-10-10 | Discharge: 2021-10-10 | Disposition: A | Discharge status: Home / Self Care | Payer: BC Managed Care – PPO | Source: Ambulatory Visit | Attending: Nephrology | Admitting: Nephrology

## 2021-10-10 VITALS — BP 118/56 | HR 72 | Temp 98.0°F | Resp 16

## 2021-10-10 DIAGNOSIS — E103593 Type 1 diabetes mellitus with proliferative diabetic retinopathy without macular edema, bilateral: Secondary | ICD-10-CM | POA: Diagnosis not present

## 2021-10-10 DIAGNOSIS — N184 Chronic kidney disease, stage 4 (severe): Secondary | ICD-10-CM | POA: Diagnosis not present

## 2021-10-10 LAB — RENAL FUNCTION PANEL
Albumin: 3.4 g/dL — ABNORMAL LOW (ref 3.5–5.0)
Anion gap: 6 (ref 5–15)
BUN: 51 mg/dL — ABNORMAL HIGH (ref 8–23)
CO2: 19 mmol/L — ABNORMAL LOW (ref 22–32)
Calcium: 8.2 mg/dL — ABNORMAL LOW (ref 8.9–10.3)
Chloride: 112 mmol/L — ABNORMAL HIGH (ref 98–111)
Creatinine, Ser: 5.4 mg/dL — ABNORMAL HIGH (ref 0.61–1.24)
GFR, Estimated: 11 mL/min — ABNORMAL LOW (ref >60)
Glucose, Bld: 167 mg/dL — ABNORMAL HIGH (ref 70–99)
Phosphorus: 4.3 mg/dL (ref 2.5–4.6)
Potassium: 3.5 mmol/L (ref 3.5–5.1)
Sodium: 137 mmol/L (ref 135–145)

## 2021-10-10 LAB — IRON AND TIBC
Iron: 44 ug/dL — ABNORMAL LOW (ref 45–182)
Saturation Ratios: 23 % (ref 17.9–39.5)
TIBC: 195 ug/dL — ABNORMAL LOW (ref 250–450)
UIBC: 151 ug/dL

## 2021-10-10 LAB — FERRITIN: Ferritin: 325 ng/mL (ref 24–336)

## 2021-10-10 LAB — POCT HEMOGLOBIN-HEMACUE: Hemoglobin: 8.8 g/dL — ABNORMAL LOW (ref 13.0–17.0)

## 2021-10-10 MED ORDER — EPOETIN ALFA-EPBX 10000 UNIT/ML IJ SOLN: 10000.0000 [IU] | INTRAMUSCULAR | Status: DC

## 2021-10-10 MED ORDER — EPOETIN ALFA 10000 UNIT/ML IJ SOLN
10000.0000 [IU] | Freq: Once | INTRAMUSCULAR | Status: DC
  Filled 2021-10-10: qty 1

## 2021-10-10 MED ORDER — EPOETIN ALFA 10000 UNIT/ML IJ SOLN
INTRAMUSCULAR | Status: AC
  Administered 2021-10-10: 10000 [IU]
  Filled 2021-10-10: qty 1

## 2021-10-14 ENCOUNTER — Other Ambulatory Visit: Payer: BC Managed Care – PPO

## 2021-10-17 ENCOUNTER — Ambulatory Visit: Payer: BC Managed Care – PPO | Admitting: Endocrinology

## 2021-10-23 ENCOUNTER — Other Ambulatory Visit: Payer: Self-pay | Admitting: *Deleted

## 2021-10-23 DIAGNOSIS — N184 Chronic kidney disease, stage 4 (severe): Secondary | ICD-10-CM

## 2021-10-28 ENCOUNTER — Telehealth: Payer: Self-pay | Admitting: *Deleted

## 2021-10-28 NOTE — Chronic Care Management (AMB) (Signed)
  Care Coordination   Note   10/28/2021 Name: Adon Gehlhausen MRN: [id number] DOB: [birth year]  Eitan Doubleday is a 61 y.o. year old male who sees Patient, No Pcp Per for primary care. I reached out to [name] by phone today to offer care coordination services.  Mr. Mayer Masker was given information about Care Coordination services today including:   The Care Coordination services include support from the care team which includes your Nurse Coordinator, Clinical Social Worker, or Pharmacist.  The Care Coordination team is here to help remove barriers to the health concerns and goals most important to you. Care Coordination services are voluntary, and the patient may decline or stop services at any time by request to their care team member.   Care Coordination Consent Status: Patient agreed to services and verbal consent obtained.   Follow up plan:  Telephone appointment with care coordination team member scheduled for:  10/31/2021  Encounter Outcome:  Pt. Scheduled  Julian Hy, [place] Direct Dial: [phone]

## 2021-11-07 ENCOUNTER — Ambulatory Visit (HOSPITAL_COMMUNITY)
Admission: RE | Admit: 2021-11-07 | Discharge: 2021-11-07 | Disposition: A | Discharge status: Home / Self Care | Payer: BC Managed Care – PPO | Source: Ambulatory Visit | Attending: Nephrology | Admitting: Nephrology

## 2021-11-07 VITALS — BP 148/76 | HR 70 | Temp 97.3°F | Resp 18

## 2021-11-07 DIAGNOSIS — N184 Chronic kidney disease, stage 4 (severe): Secondary | ICD-10-CM | POA: Insufficient documentation

## 2021-11-07 LAB — RENAL FUNCTION PANEL
Albumin: 3.5 g/dL (ref 3.5–5.0)
Anion gap: 7 (ref 5–15)
BUN: 48 mg/dL — ABNORMAL HIGH (ref 8–23)
CO2: 19 mmol/L — ABNORMAL LOW (ref 22–32)
Calcium: 8.4 mg/dL — ABNORMAL LOW (ref 8.9–10.3)
Chloride: 112 mmol/L — ABNORMAL HIGH (ref 98–111)
Creatinine, Ser: 5.27 mg/dL — ABNORMAL HIGH (ref 0.61–1.24)
GFR, Estimated: 12 mL/min — ABNORMAL LOW (ref >60)
Glucose, Bld: 201 mg/dL — ABNORMAL HIGH (ref 70–99)
Phosphorus: 4 mg/dL (ref 2.5–4.6)
Potassium: 3.6 mmol/L (ref 3.5–5.1)
Sodium: 138 mmol/L (ref 135–145)

## 2021-11-07 LAB — POCT HEMOGLOBIN-HEMACUE: Hemoglobin: 9.3 g/dL — ABNORMAL LOW (ref 13.0–17.0)

## 2021-11-07 LAB — FERRITIN: Ferritin: 232 ng/mL (ref 24–336)

## 2021-11-07 LAB — IRON AND TIBC
Iron: 39 ug/dL — ABNORMAL LOW (ref 45–182)
Saturation Ratios: 19 % (ref 17.9–39.5)
TIBC: 210 ug/dL — ABNORMAL LOW (ref 250–450)
UIBC: 171 ug/dL

## 2021-11-07 MED ORDER — EPOETIN ALFA 20000 UNIT/ML IJ SOLN: 20000.0000 [IU] | Freq: Once | INTRAMUSCULAR | Status: AC

## 2021-11-07 MED ORDER — EPOETIN ALFA 20000 UNIT/ML IJ SOLN
INTRAMUSCULAR | Status: AC
  Administered 2021-11-07: 20000 [IU] via SUBCUTANEOUS
  Filled 2021-11-07: qty 1

## 2021-11-07 MED ORDER — EPOETIN ALFA-EPBX 40000 UNIT/ML IJ SOLN: 20000.0000 [IU] | INTRAMUSCULAR | Status: DC

## 2021-11-15 DIAGNOSIS — I10 Essential (primary) hypertension: Secondary | ICD-10-CM | POA: Diagnosis not present

## 2021-11-15 DIAGNOSIS — E119 Type 2 diabetes mellitus without complications: Secondary | ICD-10-CM | POA: Diagnosis not present

## 2021-11-15 DIAGNOSIS — N2581 Secondary hyperparathyroidism of renal origin: Secondary | ICD-10-CM | POA: Diagnosis not present

## 2021-11-15 DIAGNOSIS — N185 Chronic kidney disease, stage 5: Secondary | ICD-10-CM | POA: Diagnosis not present

## 2021-11-18 ENCOUNTER — Ambulatory Visit (HOSPITAL_COMMUNITY): Payer: BC Managed Care – PPO

## 2021-11-18 ENCOUNTER — Encounter: Payer: BC Managed Care – PPO | Admitting: Surgery

## 2021-11-28 DIAGNOSIS — H4051X3 Glaucoma secondary to other eye disorders, right eye, severe stage: Secondary | ICD-10-CM | POA: Diagnosis not present

## 2021-11-28 DIAGNOSIS — H33003 Unspecified retinal detachment with retinal break, bilateral: Secondary | ICD-10-CM | POA: Diagnosis not present

## 2021-11-28 DIAGNOSIS — E103593 Type 1 diabetes mellitus with proliferative diabetic retinopathy without macular edema, bilateral: Secondary | ICD-10-CM | POA: Diagnosis not present

## 2021-11-28 DIAGNOSIS — I10 Essential (primary) hypertension: Secondary | ICD-10-CM | POA: Diagnosis not present

## 2021-12-05 ENCOUNTER — Ambulatory Visit (HOSPITAL_COMMUNITY)
Admission: RE | Admit: 2021-12-05 | Discharge: 2021-12-05 | Disposition: A | Discharge status: Home / Self Care | Payer: BC Managed Care – PPO | Source: Ambulatory Visit | Attending: Nephrology | Admitting: Nephrology

## 2021-12-05 VITALS — BP 187/78 | HR 66 | Resp 18

## 2021-12-05 DIAGNOSIS — N184 Chronic kidney disease, stage 4 (severe): Secondary | ICD-10-CM | POA: Diagnosis not present

## 2021-12-05 LAB — RENAL FUNCTION PANEL
Albumin: 3.6 g/dL (ref 3.5–5.0)
Anion gap: 10 (ref 5–15)
BUN: 54 mg/dL — ABNORMAL HIGH (ref 8–23)
CO2: 19 mmol/L — ABNORMAL LOW (ref 22–32)
Calcium: 9 mg/dL (ref 8.9–10.3)
Chloride: 112 mmol/L — ABNORMAL HIGH (ref 98–111)
Creatinine, Ser: 5.81 mg/dL — ABNORMAL HIGH (ref 0.61–1.24)
GFR, Estimated: 10 mL/min — ABNORMAL LOW (ref >60)
Glucose, Bld: 113 mg/dL — ABNORMAL HIGH (ref 70–99)
Phosphorus: 3.8 mg/dL (ref 2.5–4.6)
Potassium: 3.8 mmol/L (ref 3.5–5.1)
Sodium: 141 mmol/L (ref 135–145)

## 2021-12-05 LAB — IRON AND TIBC
Iron: 53 ug/dL (ref 45–182)
Saturation Ratios: 26 % (ref 17.9–39.5)
TIBC: 204 ug/dL — ABNORMAL LOW (ref 250–450)
UIBC: 151 ug/dL

## 2021-12-05 LAB — FERRITIN: Ferritin: 268 ng/mL (ref 24–336)

## 2021-12-05 MED ORDER — CLONIDINE HCL 0.1 MG PO TABS
0.1000 mg | ORAL_TABLET | Freq: Once | ORAL | Status: AC
  Administered 2021-12-05: 0.1 mg via ORAL

## 2021-12-05 MED ORDER — EPOETIN ALFA-EPBX 10000 UNIT/ML IJ SOLN: 20000.0000 [IU] | INTRAMUSCULAR | Status: DC

## 2021-12-05 MED ORDER — EPOETIN ALFA-EPBX 10000 UNIT/ML IJ SOLN
INTRAMUSCULAR | Status: AC
  Administered 2021-12-05: 20000 [IU] via SUBCUTANEOUS
  Filled 2021-12-05: qty 2

## 2021-12-05 MED ORDER — CLONIDINE HCL 0.1 MG PO TABS
ORAL_TABLET | ORAL | Status: AC
  Filled 2021-12-05: qty 1

## 2021-12-06 LAB — PTH, INTACT AND CALCIUM
Calcium, Total (PTH): 9.1 mg/dL (ref 8.6–10.2)
PTH: 180 pg/mL — ABNORMAL HIGH (ref 15–65)

## 2021-12-09 ENCOUNTER — Ambulatory Visit (HOSPITAL_COMMUNITY)
Admission: RE | Admit: 2021-12-09 | Discharge: 2021-12-09 | Disposition: A | Discharge status: Home / Self Care | Payer: BC Managed Care – PPO | Source: Ambulatory Visit | Attending: Surgery | Admitting: Surgery

## 2021-12-09 ENCOUNTER — Ambulatory Visit (INDEPENDENT_AMBULATORY_CARE_PROVIDER_SITE_OTHER)
Admission: RE | Admit: 2021-12-09 | Discharge: 2021-12-09 | Disposition: A | Discharge status: Home / Self Care | Payer: BC Managed Care – PPO | Source: Ambulatory Visit | Attending: Surgery | Admitting: Surgery

## 2021-12-09 ENCOUNTER — Encounter: Payer: Self-pay | Admitting: Surgery

## 2021-12-09 ENCOUNTER — Ambulatory Visit: Payer: BC Managed Care – PPO | Admitting: Surgery

## 2021-12-09 VITALS — BP 159/88 | HR 74 | Temp 98.5°F | Resp 20 | Ht 72.0 in | Wt 240.0 lb

## 2021-12-09 DIAGNOSIS — N184 Chronic kidney disease, stage 4 (severe): Secondary | ICD-10-CM | POA: Diagnosis not present

## 2021-12-09 NOTE — Progress Notes (Signed)
[facility] [place]  Patient name: Mahir Prabhakar MD MRN: [id number] DOB: 1960-08-20 Sex: male   REQUESTING PROVIDER:    Dr. 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  The patient is not yet on dialysis.  He is right-handed.  His renal failure secondary to hypertension.  He is a former Psychiatric nurse.  He is labeled as being a diabetic however his hemoglobin A1c's have been normal.  It was elevated during the time of a prolactinoma.  He does have a family history of renal failure and dialysis.  He has vision trouble.  He is a non-smoker.  PAST MEDICAL HISTORY    Past Medical History:  Diagnosis Date   Anemia    Chronic kidney disease    Detached retina    Diabetes mellitus without complication (HCC)    H/O hypogonadism    Hypertension    Prolactinoma, benign ([place])      FAMILY HISTORY   Family History  Problem Relation Age of Onset   Diabetes Mother    Colon polyps Mother    Heart disease Mother    Hypertension Mother    Colon cancer Father    Kidney disease Maternal Grandmother    Colon cancer Maternal Grandfather    Esophageal cancer Maternal Uncle    Colon cancer Maternal Uncle     SOCIAL HISTORY:   Social History   Socioeconomic History   Marital status: Married    Spouse name: Not on file   Number of children: 0   Years of education: Not on file   Highest education level: Not on file  Occupational History   Occupation: MD  Tobacco Use   Smoking status: Never   Smokeless tobacco: Never  Vaping Use   Vaping Use: Never used  Substance and Sexual Activity   Alcohol use: No   Drug use: No   Sexual activity: Yes  Other Topics Concern   Not on file  Social History Narrative   Not on file   Social Determinants of Health   Financial Resource Strain: Not on file  Food  Insecurity: Not on file  Transportation Needs: Not on file  Physical Activity: Not on file  Stress: Not on file  Social Connections: Not on file  Intimate Partner Violence: Not on file    ALLERGIES:    Allergies  Allergen Reactions   Contrast Media [Iodinated Contrast Media] Rash    CURRENT MEDICATIONS:    Current Outpatient Medications  Medication Sig Dispense Refill   amLODipine (NORVASC) 10 MG tablet Take 5 mg by mouth at bedtime.      aspirin EC 81 MG tablet Take 81 mg by mouth daily.     cabergoline (DOSTINEX) 0.5 MG tablet TAKE 1/2 TABLET TWICE A WEEK 12 tablet 2   carvedilol (COREG) 25 MG tablet Take 25 mg by mouth 2 (two) times daily with a meal.     cholecalciferol (VITAMIN D) 1000 units tablet Take 2,000 Units by mouth daily.     hydrALAZINE (APRESOLINE) 50 MG tablet Take 25 mg by mouth 3 (three) times daily. Take 1.5 tablets by mouth three times daily.     isosorbide mononitrate (IMDUR) 60 MG 24 hr tablet Take 60 mg by mouth daily.     timolol (BETIMOL) 0.25 % ophthalmic solution Place 1-2 drops into both  eyes 2 (two) times daily.     Vitamin D, Ergocalciferol, (DRISDOL) 1.25 MG (50000 UNIT) CAPS capsule TAKE 1 CAPSULE (50,000 UNITS TOTAL) BY MOUTH EVERY 7 (SEVEN) DAYS 12 capsule 2   Current Facility-Administered Medications  Medication Dose Route Frequency Provider Last Rate Last Admin   0.9 %  sodium chloride infusion  500 mL Intravenous Continuous Armbruster, Carlota Raspberry, MD        REVIEW OF SYSTEMS:   '[X]'$  denotes positive finding, '[ ]'$  denotes negative finding Cardiac  Comments:  Chest pain or chest pressure:    Shortness of breath upon exertion:    Short of breath when lying flat:    Irregular heart rhythm:        Vascular    Pain in calf, thigh, or hip brought on by ambulation:    Pain in feet at night that wakes you up from your sleep:     Blood clot in your veins:    Leg swelling:         Pulmonary    Oxygen at home:    Productive cough:      Wheezing:         Neurologic    Sudden weakness in arms or legs:     Sudden numbness in arms or legs:     Sudden onset of difficulty speaking or slurred speech:    Temporary loss of vision in one eye:     Problems with dizziness:         Gastrointestinal    Blood in stool:      Vomited blood:         Genitourinary    Burning when urinating:     Blood in urine:        Psychiatric    Major depression:         Hematologic    Bleeding problems:    Problems with blood clotting too easily:        Skin    Rashes or ulcers:        Constitutional    Fever or chills:     PHYSICAL EXAM:   Vitals:   12/09/21 1434  BP: (!) 159/88  Pulse: 74  Resp: 20  Temp: 98.5 F (36.9 C)  SpO2: 97%  Weight: 240 lb (108.9 kg)  Height: 6' (1.829 m)    GENERAL: The patient is a well-nourished male, in no acute distress. The vital signs are documented above. CARDIAC: There is a regular rate and rhythm.  VASCULAR: Palpable left radial and brachial pulse PULMONARY: Nonlabored respirations MUSCULOSKELETAL: There are no major deformities or cyanosis. NEUROLOGIC: No focal weakness or paresthesias are detected. SKIN: There are no ulcers or rashes noted. PSYCHIATRIC: The patient has a normal affect.  STUDIES:   I reviewed the following:  +-----------------+-------------+----------+---------+  Right Cephalic   Diameter (cm)Depth (cm)Findings   +-----------------+-------------+----------+---------+  Shoulder             0.29        0.86              +-----------------+-------------+----------+---------+  Prox upper arm       0.36        0.63              +-----------------+-------------+----------+---------+  Mid upper arm        0.30        0.59              +-----------------+-------------+----------+---------+  Dist  upper arm       0.31        0.35              +-----------------+-------------+----------+---------+  Antecubital fossa    0.32        0.31               +-----------------+-------------+----------+---------+  Prox forearm         0.32        0.33   branching  +-----------------+-------------+----------+---------+  Mid forearm          0.25        0.29              +-----------------+-------------+----------+---------+  Dist forearm         0.16        0.27              +-----------------+-------------+----------+---------+   +-----------------+-------------+----------+---------+  Right Basilic    Diameter (cm)Depth (cm)Findings   +-----------------+-------------+----------+---------+  Prox upper arm       0.22        1.34              +-----------------+-------------+----------+---------+  Mid upper arm        0.22        1.19              +-----------------+-------------+----------+---------+  Dist upper arm       0.20        1.08              +-----------------+-------------+----------+---------+  Antecubital fossa    0.24        1.09              +-----------------+-------------+----------+---------+  Prox forearm         0.23        0.35   branching  +-----------------+-------------+----------+---------+   +-----------------+-------------+----------+---------+  Left Cephalic    Diameter (cm)Depth (cm)Findings   +-----------------+-------------+----------+---------+  Shoulder             0.53        0.62              +-----------------+-------------+----------+---------+  Prox upper arm       0.46        0.68              +-----------------+-------------+----------+---------+  Mid upper arm        0.42        0.73              +-----------------+-------------+----------+---------+  Dist upper arm       0.32        0.44   branching  +-----------------+-------------+----------+---------+  Antecubital fossa    0.29        0.55              +-----------------+-------------+----------+---------+  Prox forearm         0.28        0.65               +-----------------+-------------+----------+---------+  Mid forearm          0.32        0.39   branching  +-----------------+-------------+----------+---------+  Dist forearm         0.42        0.22   branching  +-----------------+-------------+----------+---------+   +-----------------+-------------+----------+---------+  Left Basilic     Diameter (cm)Depth (cm)Findings   +-----------------+-------------+----------+---------+  Mid  upper arm        0.37        1.15              +-----------------+-------------+----------+---------+  Dist upper arm       0.42        0.74              +-----------------+-------------+----------+---------+  Antecubital fossa    0.40        0.54   branching  +-----------------+-------------+----------+---------+  Prox forearm         0.30        0.27              +-----------------+-------------+----------+---------+  ASSESSMENT and PLAN   Chronic renal insufficiency: The patient is in need of dialysis access.  He appears to be a candidate for a left radiocephalic fistula but potentially could require brachiocephalic fistula.  I will make this decision in the operating room.  I did discuss that if he needs a upper arm fistula that he may also need to have elevation down the road.  I would like to try to do this in his wrist as he has a very prominent vein near the radial head.  I want to make sure that his artery is adequate and not heavily calcified before doing a radiocephalic fistula.  This has been scheduled for September 27.   Leia Alf, MD, FACS [facility] [phone] Pager [phone]

## 2021-12-09 NOTE — H&P (View-Only) (Signed)
[facility] [place]  Patient name: Roberto Jent MD MRN: [id number] DOB: 08/12/1960 Sex: male   REQUESTING PROVIDER:    Dr. 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  The patient is not yet on dialysis.  He is right-handed.  His renal failure secondary to hypertension.  He is a former Psychiatric nurse.  He is labeled as being a diabetic however his hemoglobin A1c's have been normal.  It was elevated during the time of a prolactinoma.  He does have a family history of renal failure and dialysis.  He has vision trouble.  He is a non-smoker.  PAST MEDICAL HISTORY    Past Medical History:  Diagnosis Date   Anemia    Chronic kidney disease    Detached retina    Diabetes mellitus without complication (HCC)    H/O hypogonadism    Hypertension    Prolactinoma, benign ([place])      FAMILY HISTORY   Family History  Problem Relation Age of Onset   Diabetes Mother    Colon polyps Mother    Heart disease Mother    Hypertension Mother    Colon cancer Father    Kidney disease Maternal Grandmother    Colon cancer Maternal Grandfather    Esophageal cancer Maternal Uncle    Colon cancer Maternal Uncle     SOCIAL HISTORY:   Social History   Socioeconomic History   Marital status: Married    Spouse name: Not on file   Number of children: 0   Years of education: Not on file   Highest education level: Not on file  Occupational History   Occupation: MD  Tobacco Use   Smoking status: Never   Smokeless tobacco: Never  Vaping Use   Vaping Use: Never used  Substance and Sexual Activity   Alcohol use: No   Drug use: No   Sexual activity: Yes  Other Topics Concern   Not on file  Social History Narrative   Not on file   Social Determinants of Health   Financial Resource Strain: Not on file  Food  Insecurity: Not on file  Transportation Needs: Not on file  Physical Activity: Not on file  Stress: Not on file  Social Connections: Not on file  Intimate Partner Violence: Not on file    ALLERGIES:    Allergies  Allergen Reactions   Contrast Media [Iodinated Contrast Media] Rash    CURRENT MEDICATIONS:    Current Outpatient Medications  Medication Sig Dispense Refill   amLODipine (NORVASC) 10 MG tablet Take 5 mg by mouth at bedtime.      aspirin EC 81 MG tablet Take 81 mg by mouth daily.     cabergoline (DOSTINEX) 0.5 MG tablet TAKE 1/2 TABLET TWICE A WEEK 12 tablet 2   carvedilol (COREG) 25 MG tablet Take 25 mg by mouth 2 (two) times daily with a meal.     cholecalciferol (VITAMIN D) 1000 units tablet Take 2,000 Units by mouth daily.     hydrALAZINE (APRESOLINE) 50 MG tablet Take 25 mg by mouth 3 (three) times daily. Take 1.5 tablets by mouth three times daily.     isosorbide mononitrate (IMDUR) 60 MG 24 hr tablet Take 60 mg by mouth daily.     timolol (BETIMOL) 0.25 % ophthalmic solution Place 1-2 drops into both  eyes 2 (two) times daily.     Vitamin D, Ergocalciferol, (DRISDOL) 1.25 MG (50000 UNIT) CAPS capsule TAKE 1 CAPSULE (50,000 UNITS TOTAL) BY MOUTH EVERY 7 (SEVEN) DAYS 12 capsule 2   Current Facility-Administered Medications  Medication Dose Route Frequency Provider Last Rate Last Admin   0.9 %  sodium chloride infusion  500 mL Intravenous Continuous Armbruster, Carlota Raspberry, MD        REVIEW OF SYSTEMS:   '[X]'$  denotes positive finding, '[ ]'$  denotes negative finding Cardiac  Comments:  Chest pain or chest pressure:    Shortness of breath upon exertion:    Short of breath when lying flat:    Irregular heart rhythm:        Vascular    Pain in calf, thigh, or hip brought on by ambulation:    Pain in feet at night that wakes you up from your sleep:     Blood clot in your veins:    Leg swelling:         Pulmonary    Oxygen at home:    Productive cough:      Wheezing:         Neurologic    Sudden weakness in arms or legs:     Sudden numbness in arms or legs:     Sudden onset of difficulty speaking or slurred speech:    Temporary loss of vision in one eye:     Problems with dizziness:         Gastrointestinal    Blood in stool:      Vomited blood:         Genitourinary    Burning when urinating:     Blood in urine:        Psychiatric    Major depression:         Hematologic    Bleeding problems:    Problems with blood clotting too easily:        Skin    Rashes or ulcers:        Constitutional    Fever or chills:     PHYSICAL EXAM:   Vitals:   12/09/21 1434  BP: (!) 159/88  Pulse: 74  Resp: 20  Temp: 98.5 F (36.9 C)  SpO2: 97%  Weight: 240 lb (108.9 kg)  Height: 6' (1.829 m)    GENERAL: The patient is a well-nourished male, in no acute distress. The vital signs are documented above. CARDIAC: There is a regular rate and rhythm.  VASCULAR: Palpable left radial and brachial pulse PULMONARY: Nonlabored respirations MUSCULOSKELETAL: There are no major deformities or cyanosis. NEUROLOGIC: No focal weakness or paresthesias are detected. SKIN: There are no ulcers or rashes noted. PSYCHIATRIC: The patient has a normal affect.  STUDIES:   I reviewed the following:  +-----------------+-------------+----------+---------+  Right Cephalic   Diameter (cm)Depth (cm)Findings   +-----------------+-------------+----------+---------+  Shoulder             0.29        0.86              +-----------------+-------------+----------+---------+  Prox upper arm       0.36        0.63              +-----------------+-------------+----------+---------+  Mid upper arm        0.30        0.59              +-----------------+-------------+----------+---------+  Dist  upper arm       0.31        0.35              +-----------------+-------------+----------+---------+  Antecubital fossa    0.32        0.31               +-----------------+-------------+----------+---------+  Prox forearm         0.32        0.33   branching  +-----------------+-------------+----------+---------+  Mid forearm          0.25        0.29              +-----------------+-------------+----------+---------+  Dist forearm         0.16        0.27              +-----------------+-------------+----------+---------+   +-----------------+-------------+----------+---------+  Right Basilic    Diameter (cm)Depth (cm)Findings   +-----------------+-------------+----------+---------+  Prox upper arm       0.22        1.34              +-----------------+-------------+----------+---------+  Mid upper arm        0.22        1.19              +-----------------+-------------+----------+---------+  Dist upper arm       0.20        1.08              +-----------------+-------------+----------+---------+  Antecubital fossa    0.24        1.09              +-----------------+-------------+----------+---------+  Prox forearm         0.23        0.35   branching  +-----------------+-------------+----------+---------+   +-----------------+-------------+----------+---------+  Left Cephalic    Diameter (cm)Depth (cm)Findings   +-----------------+-------------+----------+---------+  Shoulder             0.53        0.62              +-----------------+-------------+----------+---------+  Prox upper arm       0.46        0.68              +-----------------+-------------+----------+---------+  Mid upper arm        0.42        0.73              +-----------------+-------------+----------+---------+  Dist upper arm       0.32        0.44   branching  +-----------------+-------------+----------+---------+  Antecubital fossa    0.29        0.55              +-----------------+-------------+----------+---------+  Prox forearm         0.28        0.65               +-----------------+-------------+----------+---------+  Mid forearm          0.32        0.39   branching  +-----------------+-------------+----------+---------+  Dist forearm         0.42        0.22   branching  +-----------------+-------------+----------+---------+   +-----------------+-------------+----------+---------+  Left Basilic     Diameter (cm)Depth (cm)Findings   +-----------------+-------------+----------+---------+  Mid  upper arm        0.37        1.15              +-----------------+-------------+----------+---------+  Dist upper arm       0.42        0.74              +-----------------+-------------+----------+---------+  Antecubital fossa    0.40        0.54   branching  +-----------------+-------------+----------+---------+  Prox forearm         0.30        0.27              +-----------------+-------------+----------+---------+  ASSESSMENT and PLAN   Chronic renal insufficiency: The patient is in need of dialysis access.  He appears to be a candidate for a left radiocephalic fistula but potentially could require brachiocephalic fistula.  I will make this decision in the operating room.  I did discuss that if he needs a upper arm fistula that he may also need to have elevation down the road.  I would like to try to do this in his wrist as he has a very prominent vein near the radial head.  I want to make sure that his artery is adequate and not heavily calcified before doing a radiocephalic fistula.  This has been scheduled for September 27.   Leia Alf, MD, FACS [facility] [phone] Pager [phone]

## 2021-12-10 ENCOUNTER — Other Ambulatory Visit: Payer: Self-pay

## 2021-12-10 DIAGNOSIS — N184 Chronic kidney disease, stage 4 (severe): Secondary | ICD-10-CM

## 2021-12-12 ENCOUNTER — Other Ambulatory Visit: Payer: BC Managed Care – PPO

## 2021-12-12 LAB — POCT HEMOGLOBIN-HEMACUE: Hemoglobin: 9.6 g/dL — ABNORMAL LOW (ref 13.0–17.0)

## 2021-12-13 ENCOUNTER — Other Ambulatory Visit (INDEPENDENT_AMBULATORY_CARE_PROVIDER_SITE_OTHER): Payer: BC Managed Care – PPO

## 2021-12-13 DIAGNOSIS — D352 Benign neoplasm of pituitary gland: Secondary | ICD-10-CM | POA: Diagnosis not present

## 2021-12-13 DIAGNOSIS — E1169 Type 2 diabetes mellitus with other specified complication: Secondary | ICD-10-CM | POA: Diagnosis not present

## 2021-12-13 DIAGNOSIS — E669 Obesity, unspecified: Secondary | ICD-10-CM

## 2021-12-13 NOTE — Addendum Note (Signed)
Addended by: Vanessa Barbara D on: 12/13/2021 02:50 PM   Modules accepted: Orders

## 2021-12-14 LAB — LIPID PANEL
Chol/HDL Ratio: 3.2 ratio (ref 0.0–5.0)
Cholesterol, Total: 96 mg/dL — ABNORMAL LOW (ref 100–199)
HDL: 30 mg/dL — ABNORMAL LOW (ref >39)
LDL Chol Calc (NIH): 50 mg/dL (ref 0–99)
Triglycerides: 79 mg/dL (ref 0–149)
VLDL Cholesterol Cal: 16 mg/dL (ref 5–40)

## 2021-12-14 LAB — HEMOGLOBIN A1C
Est. average glucose Bld gHb Est-mCnc: 97 mg/dL
Hgb A1c MFr Bld: 5 % (ref 4.8–5.6)

## 2021-12-14 LAB — GLUCOSE, RANDOM: Glucose: 136 mg/dL — ABNORMAL HIGH (ref 70–99)

## 2021-12-14 LAB — TESTOSTERONE: Testosterone: 442 ng/dL (ref 264–916)

## 2021-12-14 LAB — T4, FREE: Free T4: 1.17 ng/dL (ref 0.82–1.77)

## 2021-12-16 ENCOUNTER — Encounter: Payer: Self-pay | Admitting: Endocrinology

## 2021-12-16 ENCOUNTER — Ambulatory Visit: Payer: BC Managed Care – PPO | Admitting: Endocrinology

## 2021-12-16 VITALS — BP 164/90 | HR 67 | Ht 72.0 in

## 2021-12-16 DIAGNOSIS — E669 Obesity, unspecified: Secondary | ICD-10-CM | POA: Diagnosis not present

## 2021-12-16 DIAGNOSIS — I10 Essential (primary) hypertension: Secondary | ICD-10-CM | POA: Diagnosis not present

## 2021-12-16 DIAGNOSIS — D352 Benign neoplasm of pituitary gland: Secondary | ICD-10-CM

## 2021-12-16 DIAGNOSIS — E1169 Type 2 diabetes mellitus with other specified complication: Secondary | ICD-10-CM

## 2021-12-16 NOTE — Progress Notes (Signed)
Patient ID: Roberto Alley MD, male   DOB: 10-27-1960, 61 y.o.   MRN: [id number]   Chief complaint: Follow-up of multiple endocrine problems  History of Present Illness:  PROBLEM 1: Pituitary tumor  His MRI on 07/18/14 showed a pituitary tumor with the following description: 2.3 x 2 x 1.8 cm mass centered in the sella with suprasellar extension and cavernous sinus extension greater on the left with an appearance most suggestive of pituitary macroadenoma. This impresses upon the optic chiasm  Had difficulty with peripheral vision on the left side but this resolved Visual difficulties recently have been unrelated to the tumor   MRI in 2/17 showing focal area of delayed enhancement this represents a pituitary adenoma on the left.  This lesion measures 15 x 12 x 11 mm.   Has had a follow-up MRI in 10/2019 which showed the following  . Decreased size of mass along the left aspect of the sella turcica. Residual soft tissue along the sellar floor measures approximately 11 x 5 mm. No mass effect on the optic chiasm.  PROBLEM 2:  Hyperprolactinemia:  At baseline he had a prolactin level checked and this was markedly increased at 1312 He had symptoms of decreased libido and gynecomastia Testosterone level at baseline was low at 70  He  was started on Dostinex 0.5 mg initially half tablet twice a week and then 1 tablet twice a week on his initial consultation on 08/04/14 He has been taking this regularly without any side effects like nausea   He is since his last visit taking half tablet of Dostinex alternating with 1 tablet a week on Mondays and Fridays  His prolactin was previously increasing at 19 before the dose increase Recent labs pending  No complaints of headaches  Lab Results  Component Value Date   PROLACTIN 6.3 04/12/2021   PROLACTIN 19.3 (H) 05/28/2020   PROLACTIN 5.0 09/29/2019   PROLACTIN 6.9 04/15/2019   PROLACTIN 20.3 (H) 03/18/2019   . HYPOGONADISM:   This  was mostly transient after initial diagnosis of large prolactinoma  Overall feels good with his energy level His testosterone level has improved   Lab Results  Component Value Date   TESTOSTERONE 442 12/13/2021   THYROID function:  free T4 consistently normal  Lab Results  Component Value Date   TSH 0.404 07/18/2014   FREET4 1.17 12/13/2021   FREET4 0.96 04/12/2021   FREET4 1.16 04/15/2019     PROBLEM 3:  DIABETES type II with obesity    He was diagnosed  in 2014  and has been Previously managed by a primary care physician who he works with. On admission to the hospital in 2016 his glucose was 380 without any evidence of ketosis  Highest A1c had been 8%, subsequently in the normal range since 11/18  A1c is still normal at 5.0, he does have history of anemia  NON-insulin hypoglycemic drugs: None  Current management, blood sugar patterns and problems identified: He has still not required any diabetes medications for some time Did not bring his monitor for download He is trying to watch his diet and avoid carbohydrates and high-fat foods Previously has seen the dietitian He tries to exercise with resistance exercises or going up steps  Again he has difficulty with walking because of visual difficulties Nonfasting lab glucose was 136  Blood sugars checked at home have been: 100-130, not higher after meals also  Last visit with dietitian: 01/2018  Weight history:    Wt Readings  from Last 3 Encounters:  12/09/21 240 lb (108.9 kg)  04/16/21 240 lb (108.9 kg)  04/15/19 239 lb 9.6 oz (108.7 kg)    Lab Results  Component Value Date   HGBA1C 5.0 12/13/2021   HGBA1C 5.3 04/12/2021   HGBA1C 5.2 05/28/2020   Lab Results  Component Value Date   MICROALBUR 90.6 (H) 10/10/2016   LDLCALC 50 12/13/2021   CREATININE 5.81 (H) 12/05/2021    OTHER active problems and review of systems    Past Medical History:  Diagnosis Date   Anemia    Chronic kidney disease     Detached retina    Diabetes mellitus without complication ([place])    H/O hypogonadism    Hypertension    Prolactinoma, benign ([place])     Past Surgical History:  Procedure Laterality Date   KIDNEY STONE SURGERY  1987   NO PAST SURGERIES      Family History  Problem Relation Age of Onset   Diabetes Mother    Colon polyps Mother    Heart disease Mother    Hypertension Mother    Colon cancer Father    Kidney disease Maternal Grandmother    Colon cancer Maternal Grandfather    Esophageal cancer Maternal Uncle    Colon cancer Maternal Uncle     Social History:  reports that he has never smoked. He has never used smokeless tobacco. He reports that he does not drink alcohol and does not use drugs.  Allergies:  Allergies  Allergen Reactions   Contrast Media [Iodinated Contrast Media] Rash    Allergies as of 12/16/2021       Reactions   Contrast Media [iodinated Contrast Media] Rash        Medication List        Accurate as of December 16, 2021  4:27 PM. If you have any questions, ask your nurse or doctor.          amLODipine 10 MG tablet Commonly known as: NORVASC Take 5 mg by mouth at bedtime.   aspirin EC 81 MG tablet Take 81 mg by mouth daily.   cabergoline 0.5 MG tablet Commonly known as: DOSTINEX TAKE 1/2 TABLET TWICE A WEEK   carvedilol 25 MG tablet Commonly known as: COREG Take 25 mg by mouth 2 (two) times daily with a meal.   cholecalciferol 1000 units tablet Commonly known as: VITAMIN D Take 2,000 Units by mouth daily.   hydrALAZINE 50 MG tablet Commonly known as: APRESOLINE Take 25 mg by mouth 3 (three) times daily. Take 1.5 tablets by mouth three times daily.   isosorbide mononitrate 60 MG 24 hr tablet Commonly known as: IMDUR Take 60 mg by mouth daily.   timolol 0.25 % ophthalmic solution Commonly known as: BETIMOL Place 1-2 drops into both eyes 2 (two) times daily.   Vitamin D (Ergocalciferol) 1.25 MG (50000 UNIT) Caps  capsule Commonly known as: DRISDOL TAKE 1 CAPSULE (50,000 UNITS TOTAL) BY MOUTH EVERY 7 (SEVEN) DAYS        LABS:  Lab on 12/13/2021  Component Date Value Ref Range Status   Hgb A1c MFr Bld 12/13/2021 5.0  4.8 - 5.6 % Final   Comment:          Prediabetes: 5.7 - 6.4          Diabetes: >6.4          Glycemic control for adults with diabetes: <7.0    Est. average glucose Bld gHb Est-m* 12/13/2021 97  mg/dL Final   Glucose 12/13/2021 136 (H)  70 - 99 mg/dL Final   Cholesterol, Total 12/13/2021 96 (L)  100 - 199 mg/dL Final   Triglycerides 12/13/2021 79  0 - 149 mg/dL Final   HDL 12/13/2021 30 (L)  >39 mg/dL Final   VLDL Cholesterol Cal 12/13/2021 16  5 - 40 mg/dL Final   LDL Chol Calc (NIH) 12/13/2021 50  0 - 99 mg/dL Final   Chol/HDL Ratio 12/13/2021 3.2  0.0 - 5.0 ratio Final   Comment:                                   T. Chol/HDL Ratio                                             Men  Women                               1/2 Avg.Risk  3.4    3.3                                   Avg.Risk  5.0    4.4                                2X Avg.Risk  9.6    7.1                                3X Avg.Risk 23.4   11.0    Free T4 12/13/2021 1.17  0.82 - 1.77 ng/dL Final   Testosterone 12/13/2021 442  264 - 916 ng/dL Final   Comment: Adult male reference interval is based on a population of healthy nonobese males (BMI <30) between 33 and 75 years old. [place], [place] [phone]. PMID: [id number].      REVIEW OF SYSTEMS:        Eyes: He has multiple problems including vitreous hemorrhage, glaucoma with markedly decreased vision bilaterally  No history of hyperlipidemia  Lab Results  Component Value Date   CHOL 96 (L) 12/13/2021   HDL 30 (L) 12/13/2021   LDLCALC 50 12/13/2021   TRIG 79 12/13/2021   CHOLHDL 3.2 12/13/2021     Hypertension: has had a long history of high blood pressure   Currently on regimen of amlodipine 10 mg, carvedilol and hydralazine 50  He  is treated by nephrologist  Monitoring at home and blood pressure has been normal, mostly 846/96E systolic Tends to have higher blood pressure readings in the office  BP Readings from Last 3 Encounters:  12/16/21 (!) 164/90  12/09/21 (!) 159/88  12/05/21 (!) 187/78     NEPHROPATHY: He has had increase in microalbumin Has had renal biopsy Is also waiting for renal transplant  Followed by nephrologist with creatinine history as follows: Most recent creatinine:   Lab Results  Component Value Date   CREATININE 5.81 (H) 12/05/2021   CREATININE 5.27 (H) 11/07/2021   CREATININE 5.40 (H) 10/10/2021   CREATININE 5.49 (H) 09/12/2021    No symptoms of NEUROPATHY: Last  foot exam 08/2018    VITAMIN D deficiency: This was done as a screening and at baseline was low at 5.8.   Has been on 3000U supplements   Lab Results  Component Value Date   VD25OH 42.95 09/29/2019   VD25OH 42.6 03/18/2019   VD25OH 12.24 (L) 09/22/2018     Lab Results  Component Value Date   CALCIUM 9.1 12/05/2021   PHOS 3.8 12/05/2021     PHYSICAL EXAM:  BP (!) 164/90   Pulse 67   Ht 6' (1.829 m)   SpO2 95%   BMI 32.55 kg/m      ASSESSMENT/PLAN:   DIABETES type II, non--insulin-requiring with mild obesity  See history of present illness for detailed discussion of current diabetes management, blood sugar patterns and problems identified  His A1c is normal without medications A1c may be affected by his anemia and will need to check fructosamine on the next visit Blood sugars appear normal at home  He is not on any medications since onset of renal failure He is doing well with diet and exercise regimen and maintaining his weight   Macroprolactinoma of the pituitary gland with very high baseline prolactin level of 1315 This has been treated successfully with Dostinex and is taking 0.25 mg alternating with 0.5 mg a week with normalization of prolactin Labs from last week pending  MRI showed  reduced tumor on the last exam   HYPOGONADISM:  This was present only at baseline when he had pituitary tumor diagnosis His testosterone level is low  Lipids: Normal without any treatment   Follow-up in 6 months     Elayne Snare 12/16/2021, 4:27 PM   Note: This office note was prepared with Dragon voice recognition system technology. Any transcriptional errors that result from this process are unintentional.

## 2021-12-17 LAB — PROLACTIN: Prolactin: 8.5 ng/mL (ref 4.0–15.2)

## 2021-12-24 ENCOUNTER — Other Ambulatory Visit: Payer: Self-pay

## 2021-12-24 ENCOUNTER — Encounter (HOSPITAL_COMMUNITY): Payer: Self-pay | Admitting: Surgery

## 2021-12-24 NOTE — Progress Notes (Signed)
Spoke with pt for pre-op call. Pt has hx of HTN, but denies cardiac history. Pt has been diagnosed with Diabetes in the past due to a medication that he was taking. Pt states his A1C has been normal for several years. Most recent A1C was 5.0 on 12/13/21.   Pt does have significant vision loss.   Shower instructions given to pt and he voiced understanding.

## 2021-12-24 NOTE — Progress Notes (Signed)
Patient unable to change arrival time and cannot be here until 0720. Dr. Trula Slade and OR made aware. Instructed to expedite pre-op in am for patient. Agricultural consultant and receiving RN aware.

## 2021-12-24 NOTE — Anesthesia Preprocedure Evaluation (Signed)
Anesthesia Evaluation  Patient identified by MRN, date of birth, ID band Patient awake    Reviewed: Allergy & Precautions, NPO status , Patient's Chart, lab work & pertinent test results, reviewed documented beta blocker date and time   History of Anesthesia Complications Negative for: history of anesthetic complications  Airway Mallampati: II  TM Distance: >3 FB Neck ROM: Full    Dental no notable dental hx.    Pulmonary neg pulmonary ROS,    Pulmonary exam normal        Cardiovascular hypertension, Pt. on medications and Pt. on home beta blockers Normal cardiovascular exam     Neuro/Psych Prolactinoma negative psych ROS   GI/Hepatic negative GI ROS, Neg liver ROS,   Endo/Other  diabetes (A1c 5.0), Well Controlled, Type 2  Renal/GU ESRFRenal disease  negative genitourinary   Musculoskeletal negative musculoskeletal ROS (+)   Abdominal   Peds  Hematology  (+) Blood dyscrasia (Hgb 9.6), anemia ,   Anesthesia Other Findings Day of surgery medications reviewed with patient.  Reproductive/Obstetrics negative OB ROS                            Anesthesia Physical Anesthesia Plan  ASA: 3  Anesthesia Plan: MAC and Regional   Post-op Pain Management: Tylenol PO (pre-op)*   Induction: Intravenous  PONV Risk Score and Plan: 1 and Treatment may vary due to age or medical condition, Ondansetron and Propofol infusion  Airway Management Planned: Natural Airway and Simple Face Mask  Additional Equipment: None  Intra-op Plan:   Post-operative Plan:   Informed Consent: I have reviewed the patients History and Physical, chart, labs and discussed the procedure including the risks, benefits and alternatives for the proposed anesthesia with the patient or authorized representative who has indicated his/her understanding and acceptance.       Plan Discussed with: CRNA  Anesthesia Plan  Comments:        Anesthesia Quick Evaluation

## 2021-12-25 ENCOUNTER — Ambulatory Visit (HOSPITAL_COMMUNITY): Payer: BC Managed Care – PPO | Admitting: Certified Registered Nurse Anesthetist

## 2021-12-25 ENCOUNTER — Encounter (HOSPITAL_COMMUNITY)
Admission: RE | Disposition: A | Discharge status: Home / Self Care | Payer: Self-pay | Source: Home / Self Care | Attending: Surgery

## 2021-12-25 ENCOUNTER — Encounter (HOSPITAL_COMMUNITY): Payer: Self-pay | Admitting: Surgery

## 2021-12-25 ENCOUNTER — Other Ambulatory Visit: Payer: Self-pay

## 2021-12-25 ENCOUNTER — Ambulatory Visit (HOSPITAL_COMMUNITY)
Admission: RE | Admit: 2021-12-25 | Discharge: 2021-12-25 | Disposition: A | Discharge status: Home / Self Care | Payer: BC Managed Care – PPO | Attending: Surgery | Admitting: Surgery

## 2021-12-25 DIAGNOSIS — E1122 Type 2 diabetes mellitus with diabetic chronic kidney disease: Secondary | ICD-10-CM | POA: Insufficient documentation

## 2021-12-25 DIAGNOSIS — N184 Chronic kidney disease, stage 4 (severe): Secondary | ICD-10-CM | POA: Diagnosis not present

## 2021-12-25 DIAGNOSIS — I129 Hypertensive chronic kidney disease with stage 1 through stage 4 chronic kidney disease, or unspecified chronic kidney disease: Secondary | ICD-10-CM | POA: Insufficient documentation

## 2021-12-25 DIAGNOSIS — Z79899 Other long term (current) drug therapy: Secondary | ICD-10-CM | POA: Insufficient documentation

## 2021-12-25 DIAGNOSIS — D631 Anemia in chronic kidney disease: Secondary | ICD-10-CM | POA: Diagnosis not present

## 2021-12-25 HISTORY — DX: Unqualified visual loss, both eyes: H54.3

## 2021-12-25 HISTORY — PX: AV FISTULA PLACEMENT: SHX1204

## 2021-12-25 HISTORY — DX: Personal history of other specified conditions: Z87.898

## 2021-12-25 HISTORY — DX: Personal history of urinary calculi: Z87.442

## 2021-12-25 LAB — POCT I-STAT, CHEM 8
BUN: 60 mg/dL — ABNORMAL HIGH (ref 8–23)
Calcium, Ion: 1.11 mmol/L — ABNORMAL LOW (ref 1.15–1.40)
Chloride: 113 mmol/L — ABNORMAL HIGH (ref 98–111)
Creatinine, Ser: 6.1 mg/dL — ABNORMAL HIGH (ref 0.61–1.24)
Glucose, Bld: 115 mg/dL — ABNORMAL HIGH (ref 70–99)
HCT: 32 % — ABNORMAL LOW (ref 39.0–52.0)
Hemoglobin: 10.9 g/dL — ABNORMAL LOW (ref 13.0–17.0)
Potassium: 4.6 mmol/L (ref 3.5–5.1)
Sodium: 142 mmol/L (ref 135–145)
TCO2: 19 mmol/L — ABNORMAL LOW (ref 22–32)

## 2021-12-25 LAB — GLUCOSE, CAPILLARY: Glucose-Capillary: 135 mg/dL — ABNORMAL HIGH (ref 70–99)

## 2021-12-25 SURGERY — ARTERIOVENOUS (AV) FISTULA CREATION
Anesthesia: Monitor Anesthesia Care | Site: Arm Lower | Laterality: Left

## 2021-12-25 MED ORDER — DEXAMETHASONE SODIUM PHOSPHATE 10 MG/ML IJ SOLN
INTRAMUSCULAR | Status: DC | PRN
  Administered 2021-12-25: 5 mg via INTRAVENOUS

## 2021-12-25 MED ORDER — HEPARIN 6000 UNIT IRRIGATION SOLUTION
Status: AC
  Filled 2021-12-25: qty 500

## 2021-12-25 MED ORDER — ACETAMINOPHEN 500 MG PO TABS
ORAL_TABLET | ORAL | Status: AC
  Administered 2021-12-25: 1000 mg via ORAL
  Filled 2021-12-25: qty 2

## 2021-12-25 MED ORDER — FENTANYL CITRATE (PF) 250 MCG/5ML IJ SOLN
INTRAMUSCULAR | Status: AC
  Filled 2021-12-25: qty 5

## 2021-12-25 MED ORDER — LIDOCAINE-EPINEPHRINE (PF) 1 %-1:200000 IJ SOLN
INTRAMUSCULAR | Status: AC
  Filled 2021-12-25: qty 30

## 2021-12-25 MED ORDER — PROPOFOL 10 MG/ML IV BOLUS
INTRAVENOUS | Status: DC | PRN
  Administered 2021-12-25: 40 mg via INTRAVENOUS

## 2021-12-25 MED ORDER — 0.9 % SODIUM CHLORIDE (POUR BTL) OPTIME
TOPICAL | Status: DC | PRN
  Administered 2021-12-25: 500 mL

## 2021-12-25 MED ORDER — SODIUM CHLORIDE 0.9 % IV SOLN: INTRAVENOUS | Status: DC

## 2021-12-25 MED ORDER — MIDAZOLAM HCL 2 MG/2ML IJ SOLN
INTRAMUSCULAR | Status: DC | PRN
  Administered 2021-12-25 (×2): 1 mg via INTRAVENOUS

## 2021-12-25 MED ORDER — MIDAZOLAM HCL 2 MG/2ML IJ SOLN
INTRAMUSCULAR | Status: AC
  Filled 2021-12-25: qty 2

## 2021-12-25 MED ORDER — LIDOCAINE HCL (PF) 1 % IJ SOLN
INTRAMUSCULAR | Status: AC
  Filled 2021-12-25: qty 30

## 2021-12-25 MED ORDER — ACETAMINOPHEN 500 MG PO TABS: 1000.0000 mg | ORAL_TABLET | Freq: Once | ORAL | Status: AC

## 2021-12-25 MED ORDER — OXYCODONE HCL 5 MG/5ML PO SOLN: 5.0000 mg | Freq: Once | ORAL | Status: DC | PRN

## 2021-12-25 MED ORDER — MEPIVACAINE HCL (PF) 2 % IJ SOLN
INTRAMUSCULAR | Status: DC | PRN
  Administered 2021-12-25: 30 mL

## 2021-12-25 MED ORDER — PROPOFOL 500 MG/50ML IV EMUL
INTRAVENOUS | Status: DC | PRN
  Administered 2021-12-25: 100 ug/kg/min via INTRAVENOUS

## 2021-12-25 MED ORDER — HEPARIN 6000 UNIT IRRIGATION SOLUTION
Status: DC | PRN
  Administered 2021-12-25: 1

## 2021-12-25 MED ORDER — PROPOFOL 10 MG/ML IV BOLUS
INTRAVENOUS | Status: AC
  Filled 2021-12-25: qty 20

## 2021-12-25 MED ORDER — OXYCODONE-ACETAMINOPHEN 5-325 MG PO TABS: 1.0000 | ORAL_TABLET | Freq: Four times a day (QID) | ORAL | 0 refills | Status: DC | PRN

## 2021-12-25 MED ORDER — CHLORHEXIDINE GLUCONATE 4 % EX LIQD: 60.0000 mL | Freq: Once | CUTANEOUS | Status: DC

## 2021-12-25 MED ORDER — CHLORHEXIDINE GLUCONATE 0.12 % MT SOLN
OROMUCOSAL | Status: AC
  Administered 2021-12-25: 15 mL
  Filled 2021-12-25: qty 15

## 2021-12-25 MED ORDER — OXYCODONE HCL 5 MG PO TABS: 5.0000 mg | ORAL_TABLET | Freq: Once | ORAL | Status: DC | PRN

## 2021-12-25 MED ORDER — ONDANSETRON HCL 4 MG/2ML IJ SOLN
INTRAMUSCULAR | Status: DC | PRN
  Administered 2021-12-25: 4 mg via INTRAVENOUS

## 2021-12-25 MED ORDER — FENTANYL CITRATE (PF) 100 MCG/2ML IJ SOLN: 25.0000 ug | INTRAMUSCULAR | Status: DC | PRN

## 2021-12-25 MED ORDER — CEFAZOLIN SODIUM-DEXTROSE 2-4 GM/100ML-% IV SOLN
INTRAVENOUS | Status: AC
  Filled 2021-12-25: qty 100

## 2021-12-25 MED ORDER — FENTANYL CITRATE (PF) 250 MCG/5ML IJ SOLN
INTRAMUSCULAR | Status: DC | PRN
  Administered 2021-12-25 (×2): 50 ug via INTRAVENOUS

## 2021-12-25 MED ORDER — CEFAZOLIN SODIUM-DEXTROSE 2-4 GM/100ML-% IV SOLN
2.0000 g | INTRAVENOUS | Status: AC
  Administered 2021-12-25: 2 g via INTRAVENOUS

## 2021-12-25 MED ORDER — EPINEPHRINE PF 1 MG/ML IJ SOLN
INTRAMUSCULAR | Status: DC | PRN
  Administered 2021-12-25: .15 mg via SUBCUTANEOUS

## 2021-12-25 SURGICAL SUPPLY — 39 items
ARMBAND PINK RESTRICT EXTREMIT (MISCELLANEOUS) ×2 IMPLANT
BAG COUNTER SPONGE SURGICOUNT (BAG) ×1 IMPLANT
CANISTER SUCT 3000ML PPV (MISCELLANEOUS) ×1 IMPLANT
CLIP TI MEDIUM 6 (CLIP) IMPLANT
CLIP VESOCCLUDE MED 6/CT (CLIP) ×1 IMPLANT
CLIP VESOCCLUDE SM WIDE 6/CT (CLIP) ×1 IMPLANT
COVER PROBE W GEL 5X96 (DRAPES) ×1 IMPLANT
COVER ULTRASOUND PROBE 36 ST (MISCELLANEOUS) IMPLANT
DERMABOND ADVANCED .7 DNX12 (GAUZE/BANDAGES/DRESSINGS) ×1 IMPLANT
ELECT REM PT RETURN 9FT ADLT (ELECTROSURGICAL) ×1
ELECTRODE REM PT RTRN 9FT ADLT (ELECTROSURGICAL) ×1 IMPLANT
GLOVE BIO SURGEON STRL SZ 6.5 (GLOVE) IMPLANT
GLOVE BIOGEL PI IND STRL 6.5 (GLOVE) IMPLANT
GLOVE SURG SS PI 7.5 STRL IVOR (GLOVE) ×3 IMPLANT
GOWN STRL REUS W/ TWL LRG LVL3 (GOWN DISPOSABLE) ×2 IMPLANT
GOWN STRL REUS W/ TWL XL LVL3 (GOWN DISPOSABLE) ×1 IMPLANT
GOWN STRL REUS W/TWL LRG LVL3 (GOWN DISPOSABLE) ×2
GOWN STRL REUS W/TWL XL LVL3 (GOWN DISPOSABLE) ×1
HEMOSTAT SNOW SURGICEL 2X4 (HEMOSTASIS) IMPLANT
KIT BASIN OR (CUSTOM PROCEDURE TRAY) ×1 IMPLANT
KIT TURNOVER KIT B (KITS) ×1 IMPLANT
NS IRRIG 1000ML POUR BTL (IV SOLUTION) ×1 IMPLANT
PACK CV ACCESS (CUSTOM PROCEDURE TRAY) ×1 IMPLANT
PAD ARMBOARD 7.5X6 YLW CONV (MISCELLANEOUS) ×2 IMPLANT
SLING ARM FOAM STRAP LRG (SOFTGOODS) IMPLANT
SLING ARM FOAM STRAP MED (SOFTGOODS) IMPLANT
SUT MNCRL AB 4-0 PS2 18 (SUTURE) IMPLANT
SUT PROLENE 6 0 BV (SUTURE) IMPLANT
SUT PROLENE 6 0 CC (SUTURE) ×1 IMPLANT
SUT SILK 2 0 (SUTURE) ×1
SUT SILK 2-0 18XBRD TIE 12 (SUTURE) IMPLANT
SUT SILK 3 0 (SUTURE) ×1
SUT SILK 3-0 18XBRD TIE 12 (SUTURE) IMPLANT
SUT VIC AB 3-0 SH 27 (SUTURE) ×2
SUT VIC AB 3-0 SH 27X BRD (SUTURE) ×1 IMPLANT
SUT VICRYL 4-0 PS2 18IN ABS (SUTURE) ×1 IMPLANT
TOWEL GREEN STERILE (TOWEL DISPOSABLE) ×1 IMPLANT
UNDERPAD 30X36 HEAVY ABSORB (UNDERPADS AND DIAPERS) ×1 IMPLANT
WATER STERILE IRR 1000ML POUR (IV SOLUTION) ×1 IMPLANT

## 2021-12-25 NOTE — Anesthesia Procedure Notes (Signed)
Procedure Name: MAC Date/Time: 12/25/2021 8:22 AM  Performed by: Reece Agar, CRNAPre-anesthesia Checklist: Patient identified, Emergency Drugs available, Suction available and Patient being monitored Patient Re-evaluated:Patient Re-evaluated prior to induction Oxygen Delivery Method: Simple face mask

## 2021-12-25 NOTE — Transfer of Care (Signed)
Immediate Anesthesia Transfer of Care Note  Patient: [name] MD  Procedure(s) Performed: LEFT RADIAL CEPHALIC FISTULA CREATION (Left: Arm Lower)  Patient Location: PACU  Anesthesia Type:MAC combined with regional for post-op pain  Level of Consciousness: drowsy  Airway & Oxygen Therapy: Patient Spontanous Breathing and Patient connected to face mask oxygen  Post-op Assessment: Report given to RN and Post -op Vital signs reviewed and stable  Post vital signs: Reviewed and stable  Last Vitals:  Vitals Value Taken Time  BP 138/79 12/25/21 0951  Temp    Pulse 66 12/25/21 0954  Resp 12 12/25/21 0954  SpO2 100 % 12/25/21 0954  Vitals shown include unvalidated device data.  Last Pain:  Vitals:   12/25/21 0749  PainSc: 0-No pain      Patients Stated Pain Goal: 0 (96/72/89 7915)  Complications: No notable events documented.

## 2021-12-25 NOTE — Anesthesia Postprocedure Evaluation (Signed)
Anesthesia Post Note  Patient: [name] MD  Procedure(s) Performed: LEFT RADIAL CEPHALIC FISTULA CREATION (Left: Arm Lower)     Patient location during evaluation: PACU Anesthesia Type: Regional Level of consciousness: awake and alert Pain management: pain level controlled Vital Signs Assessment: post-procedure vital signs reviewed and stable Respiratory status: spontaneous breathing, nonlabored ventilation and respiratory function stable Cardiovascular status: blood pressure returned to baseline Postop Assessment: no apparent nausea or vomiting Anesthetic complications: no   No notable events documented.  Last Vitals:  Vitals:   12/25/21 1000 12/25/21 1015  BP: (!) 155/82 (!) 163/110  Pulse: 70 68  Resp: 16 15  Temp:  36.6 C  SpO2: 95% 96%    Last Pain:  Vitals:   12/25/21 1015  PainSc: 0-No pain                 Marthenia Rolling

## 2021-12-25 NOTE — Anesthesia Procedure Notes (Signed)
Anesthesia Regional Block: Supraclavicular block   Pre-Anesthetic Checklist: , timeout performed,  Correct Patient, Correct Site, Correct Laterality,  Correct Procedure, Correct Position, site marked,  Risks and benefits discussed,  Pre-op evaluation,  At surgeon's request and post-op pain management  Laterality: Left  Prep: Maximum Sterile Barrier Precautions used, chloraprep       Needles:  Injection technique: Single-shot  Needle Type: Echogenic Stimulator Needle     Needle Length: 9cm  Needle Gauge: 22     Additional Needles:   Procedures:,,,, ultrasound used (permanent image in chart),,    Narrative:  Start time: 12/25/2021 8:06 AM End time: 12/25/2021 8:09 AM Injection made incrementally with aspirations every 5 mL.  Performed by: Personally  Anesthesiologist: Brennan Bailey, MD  Additional Notes: Risks, benefits, and alternative discussed. Patient gave consent for procedure. Patient prepped and draped in sterile fashion. Sedation administered, patient remains easily responsive to voice. Relevant anatomy identified with ultrasound guidance. Local anesthetic given in 5cc increments with no signs or symptoms of intravascular injection. No pain or paraesthesias with injection. Patient monitored throughout procedure with signs of LAST or immediate complications. Tolerated well. Ultrasound image placed in chart.  Tawny Asal, MD

## 2021-12-25 NOTE — Discharge Instructions (Signed)
   [facility]  Discharge Instructions  AV Fistula or Graft Surgery for Dialysis Access  Please refer to the following instructions for your post-procedure care. Your surgeon or physician assistant will discuss any changes with you.  Activity  You may drive the day following your surgery, if you are comfortable and no longer taking prescription pain medication. Resume full activity as the soreness in your incision resolves.  Bathing/Showering  You may shower after you go home. Keep your incision dry for 48 hours. Do not soak in a bathtub, hot tub, or swim until the incision heals completely. You may not shower if you have a hemodialysis catheter.  Incision Care  Clean your incision with mild soap and water after 48 hours. Pat the area dry with a clean towel. You do not need a bandage unless otherwise instructed. Do not apply any ointments or creams to your incision. You may have skin glue on your incision. Do not peel it off. It will come off on its own in about one week. Your arm may swell a bit after surgery. To reduce swelling use pillows to elevate your arm so it is above your heart. Your doctor will tell you if you need to lightly wrap your arm with an ACE bandage.  Diet  Resume your normal diet. There are not special food restrictions following this procedure. In order to heal from your surgery, it is CRITICAL to get adequate nutrition. Your body requires vitamins, minerals, and protein. Vegetables are the best source of vitamins and minerals. Vegetables also provide the perfect balance of protein. Processed food has little nutritional value, so try to avoid this.  Medications  Resume taking all of your medications. If your incision is causing pain, you may take over-the counter pain relievers such as acetaminophen (Tylenol). If you were prescribed a stronger pain medication, please be aware these medications can cause nausea and constipation. Prevent  nausea by taking the medication with a snack or meal. Avoid constipation by drinking plenty of fluids and eating foods with high amount of fiber, such as fruits, vegetables, and grains.  Do not take Tylenol if you are taking prescription pain medications.  Follow up Your surgeon may want to see you in the office following your access surgery. If so, this will be arranged at the time of your surgery.  Please call us immediately for any of the following conditions:  Increased pain, redness, drainage (pus) from your incision site Fever of 101 degrees or higher Severe or worsening pain at your incision site Hand pain or numbness.  Reduce your risk of vascular disease:  Stop smoking. If you would like help, call [facility] at 1-800-QUIT-NOW (941)607-0995) or [place] [facility] [place] your cholesterol Maintain a desired weight Control your diabetes Keep your blood pressure down  Dialysis  It will take several weeks to several months for your new dialysis access to be ready for use. Your surgeon will determine when it is okay to use it. Your nephrologist will continue to direct your dialysis. You can continue to use your Permcath until your new access is ready for use.   12/25/2021 Roberto Alley MD [id number] 13-Jul-1960  Surgeon(s): Roberto Mitchell, MD  Procedure(s): LEFT RADIAL CEPHALIC FISTULA CREATION  x Do not stick fistula for 12 weeks    If you have any questions, please call the office at [phone].

## 2021-12-25 NOTE — Op Note (Signed)
    Patient name: Roberto Gillooly MD MRN: [id number] DOB: 19-Nov-1960 Sex: male  12/25/2021 Pre-operative Diagnosis: CKD 4 Post-operative diagnosis:  Same Surgeon:  Annamarie Major Assistants:  Leontine Locket, PA Procedure:   Left radiocephalic fistula Anesthesia:  Regional Blood Loss:  minimal Specimens:  none  Findings: Healthy appearing 3 mm radial artery.  The vein was very friable but was greater than 3 mm throughout.  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  He is right-handed.  Vein mapping revealed a adequate cephalic vein.  Procedure:  The patient was identified in the holding area and taken to [place] [address]  The patient was then placed supine on the table. regional anesthesia was administered.  The patient was prepped and draped in the usual sterile fashion.  A time out was called and antibiotics were administered.  A PA was necessary to expedite the procedure and assist with technical details.  She helped with exposure of the artery and vein by providing suction and retraction.  She help follow suture for the anastomosis.  Ultrasound was used to evaluate the cephalic vein in the upper arm which appeared to be adequate for fistula creation.  In addition the radial artery appeared to be disease-free.  A longitudinal incision was made just proximal to the wrist.  I first began by dissecting out the cephalic vein.  This was a 3 to 4 mm vein.  It was relatively friable with multiple small branches which had to be ligated.  Ultimately it was fully mobilized.  Side branches were divided.  It was marked for orientation.  I then dissected out the radial artery.  This was a 3 mm disease-free artery.  It was mobilized proximally and distally and encircled with Vesseloops.  The vein was then ligated distally with a 2-0 silk tie.  It distended nicely with heparin saline.  The radial artery was then occluded with vascular clamps.  A #11 blade was used to make an  arteriotomy which was extended longitudinally with Potts scissors.  The vein was cut to the appropriate length.  It was spatulated to fit the size the arteriotomy in a running anastomosis was created with 6-0 Prolene.  Prior to completion, the appropriate flushing maneuvers were performed and the anastomosis was completed.  I inspected the course of the vein to make sure there were no kinks or additional branches.  There was a good thrill within the fistula.  There was brisk radial and ulnar Doppler signals distal to the fistula.  The wound was irrigated.  Hemostasis was achieved.  The deep tissue was closed with 3-0 Vicryl and the skin was closed with a 4-0 Monocryl.  Dermabond was applied.  There were no immediate complications.   Disposition: To PACU stable.   Theotis Burrow, M.D., [facility] [place] Office: [phone] Pager:  [phone]

## 2021-12-25 NOTE — Interval H&P Note (Signed)
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  The various methods of treatment have been discussed with the patient and family. After consideration of risks, benefits and other options for treatment, the patient has consented to  Procedure(s): LEFT ARM FISTULA CREATION (Left) as a surgical intervention.  The patient's history has been reviewed, patient examined, no change in status, stable for surgery.  I have reviewed the patient's chart and labs.  Questions were answered to the patient's satisfaction.     Roberto [name]

## 2021-12-26 ENCOUNTER — Encounter (HOSPITAL_COMMUNITY): Payer: Self-pay | Admitting: Surgery

## 2021-12-30 ENCOUNTER — Encounter: Payer: Self-pay | Admitting: Gastroenterology

## 2022-01-02 ENCOUNTER — Encounter (HOSPITAL_COMMUNITY)
Admission: RE | Admit: 2022-01-02 | Discharge: 2022-01-02 | Disposition: A | Discharge status: Home / Self Care | Payer: BC Managed Care – PPO | Source: Ambulatory Visit | Attending: Nephrology | Admitting: Nephrology

## 2022-01-02 VITALS — BP 179/82 | HR 99 | Temp 97.6°F

## 2022-01-02 DIAGNOSIS — N184 Chronic kidney disease, stage 4 (severe): Secondary | ICD-10-CM | POA: Insufficient documentation

## 2022-01-02 LAB — FERRITIN: Ferritin: 241 ng/mL (ref 24–336)

## 2022-01-02 LAB — IRON AND TIBC
Iron: 52 ug/dL (ref 45–182)
Saturation Ratios: 24 % (ref 17.9–39.5)
TIBC: 213 ug/dL — ABNORMAL LOW (ref 250–450)
UIBC: 161 ug/dL

## 2022-01-02 LAB — RENAL FUNCTION PANEL
Albumin: 3.5 g/dL (ref 3.5–5.0)
Anion gap: 7 (ref 5–15)
BUN: 59 mg/dL — ABNORMAL HIGH (ref 8–23)
CO2: 19 mmol/L — ABNORMAL LOW (ref 22–32)
Calcium: 8.6 mg/dL — ABNORMAL LOW (ref 8.9–10.3)
Chloride: 112 mmol/L — ABNORMAL HIGH (ref 98–111)
Creatinine, Ser: 5.43 mg/dL — ABNORMAL HIGH (ref 0.61–1.24)
GFR, Estimated: 11 mL/min — ABNORMAL LOW (ref >60)
Glucose, Bld: 118 mg/dL — ABNORMAL HIGH (ref 70–99)
Phosphorus: 4.2 mg/dL (ref 2.5–4.6)
Potassium: 4 mmol/L (ref 3.5–5.1)
Sodium: 138 mmol/L (ref 135–145)

## 2022-01-02 LAB — POCT HEMOGLOBIN-HEMACUE: Hemoglobin: 9.6 g/dL — ABNORMAL LOW (ref 13.0–17.0)

## 2022-01-02 MED ORDER — EPOETIN ALFA-EPBX 10000 UNIT/ML IJ SOLN
20000.0000 [IU] | INTRAMUSCULAR | Status: DC
  Administered 2022-01-02: 20000 [IU] via SUBCUTANEOUS

## 2022-01-02 MED ORDER — EPOETIN ALFA-EPBX 10000 UNIT/ML IJ SOLN
INTRAMUSCULAR | Status: AC
  Filled 2022-01-02: qty 2

## 2022-01-16 DIAGNOSIS — E103593 Type 1 diabetes mellitus with proliferative diabetic retinopathy without macular edema, bilateral: Secondary | ICD-10-CM | POA: Diagnosis not present

## 2022-01-16 DIAGNOSIS — H4051X3 Glaucoma secondary to other eye disorders, right eye, severe stage: Secondary | ICD-10-CM | POA: Diagnosis not present

## 2022-01-22 DIAGNOSIS — H4053X4 Glaucoma secondary to other eye disorders, bilateral, indeterminate stage: Secondary | ICD-10-CM | POA: Diagnosis not present

## 2022-01-30 ENCOUNTER — Encounter (HOSPITAL_COMMUNITY): Payer: BC Managed Care – PPO

## 2022-02-10 ENCOUNTER — Other Ambulatory Visit: Payer: Self-pay | Admitting: *Deleted

## 2022-02-10 DIAGNOSIS — N184 Chronic kidney disease, stage 4 (severe): Secondary | ICD-10-CM

## 2022-02-13 DIAGNOSIS — Z91041 Radiographic dye allergy status: Secondary | ICD-10-CM | POA: Diagnosis not present

## 2022-02-13 DIAGNOSIS — Z01818 Encounter for other preprocedural examination: Secondary | ICD-10-CM | POA: Diagnosis not present

## 2022-02-13 DIAGNOSIS — Z992 Dependence on renal dialysis: Secondary | ICD-10-CM | POA: Diagnosis not present

## 2022-02-13 DIAGNOSIS — Z79899 Other long term (current) drug therapy: Secondary | ICD-10-CM | POA: Diagnosis not present

## 2022-02-13 DIAGNOSIS — Z86018 Personal history of other benign neoplasm: Secondary | ICD-10-CM | POA: Diagnosis not present

## 2022-02-13 DIAGNOSIS — N186 End stage renal disease: Secondary | ICD-10-CM | POA: Diagnosis not present

## 2022-02-13 DIAGNOSIS — I1311 Hypertensive heart and chronic kidney disease without heart failure, with stage 5 chronic kidney disease, or end stage renal disease: Secondary | ICD-10-CM | POA: Diagnosis not present

## 2022-02-13 DIAGNOSIS — E1022 Type 1 diabetes mellitus with diabetic chronic kidney disease: Secondary | ICD-10-CM | POA: Diagnosis not present

## 2022-02-17 ENCOUNTER — Ambulatory Visit (HOSPITAL_COMMUNITY)
Admission: RE | Admit: 2022-02-17 | Discharge: 2022-02-17 | Disposition: A | Discharge status: Home / Self Care | Payer: BC Managed Care – PPO | Source: Ambulatory Visit | Attending: Surgery | Admitting: Surgery

## 2022-02-17 ENCOUNTER — Other Ambulatory Visit: Payer: Self-pay

## 2022-02-17 ENCOUNTER — Ambulatory Visit (INDEPENDENT_AMBULATORY_CARE_PROVIDER_SITE_OTHER): Payer: BC Managed Care – PPO | Admitting: Physician Assistant

## 2022-02-17 VITALS — BP 155/65 | HR 67 | Temp 97.6°F | Resp 16 | Ht 72.0 in | Wt 230.0 lb

## 2022-02-17 DIAGNOSIS — N184 Chronic kidney disease, stage 4 (severe): Secondary | ICD-10-CM

## 2022-02-17 NOTE — Progress Notes (Signed)
POST OPERATIVE OFFICE NOTE    CC:  F/u for surgery  HPI:  This is a 61 y.o. male who is s/p left RC av fistula  on 12/25/21 by Dr. Trula Slade.    Pt returns today for follow up.  Pt states he can feel a pulse in the left forearm.  He denies steal symptoms of pain, loss of motors or loss of sensation in the left UE.  He is not currently on HD he is CKD stage IV.     Allergies  Allergen Reactions   Contrast Media [Iodinated Contrast Media] Rash    Current Outpatient Medications  Medication Sig Dispense Refill   amLODipine (NORVASC) 10 MG tablet Take 5 mg by mouth at bedtime.      aspirin EC 81 MG tablet Take 81 mg by mouth daily.     atropine 1 % ophthalmic solution Place 1 drop into the right eye daily.     brimonidine (ALPHAGAN) 0.2 % ophthalmic solution Place 1 drop into both eyes in the morning and at bedtime.     cabergoline (DOSTINEX) 0.5 MG tablet TAKE 1/2 TABLET TWICE A WEEK 12 tablet 2   calcitRIOL (ROCALTROL) 0.25 MCG capsule Take 0.25 mcg by mouth daily.     carvedilol (COREG) 25 MG tablet Take 25 mg by mouth 2 (two) times daily with a meal.     cloNIDine (CATAPRES - DOSED IN MG/24 HR) 0.3 mg/24hr patch Place 0.3 mg onto the skin once a week.     cloNIDine (CATAPRES) 0.1 MG tablet Take 0.1 mg by mouth 3 (three) times daily as needed (Breakthrough hypertension).     hydrALAZINE (APRESOLINE) 50 MG tablet Take 25 mg by mouth 3 (three) times daily.     isosorbide mononitrate (IMDUR) 60 MG 24 hr tablet Take 60 mg by mouth daily.     oxyCODONE-acetaminophen (PERCOCET) 5-325 MG tablet Take 1 tablet by mouth every 6 (six) hours as needed for severe pain. 8 tablet 0   timolol (BETIMOL) 0.25 % ophthalmic solution Place 1-2 drops into both eyes 2 (two) times daily.     Vitamin D, Ergocalciferol, (DRISDOL) 1.25 MG (50000 UNIT) CAPS capsule TAKE 1 CAPSULE (50,000 UNITS TOTAL) BY MOUTH EVERY 7 (SEVEN) DAYS (Patient taking differently: Take 50,000 Units by mouth every Monday.) 12 capsule 2    Current Facility-Administered Medications  Medication Dose Route Frequency Provider Last Rate Last Admin   0.9 %  sodium chloride infusion  500 mL Intravenous Continuous Armbruster, Carlota Raspberry, MD         ROS:  See HPI  Physical Exam:  Findings:  +--------------------+----------+-----------------+--------+  AVF                PSV (cm/s)Flow Vol (mL/min)Comments  +--------------------+----------+-----------------+--------+  Native artery inflow    80           26                  +--------------------+----------+-----------------+--------+  AVF Anastomosis        132                               +--------------------+----------+-----------------+--------+     +------------+----------+-------------+----------+-------------------------  ----+  OUTFLOW VEINPSV (cm/s)Diameter (cm)Depth (cm)          Describe              +------------+----------+-------------+----------+-------------------------  ----+  Mid UA  27        0.34        0.22         minimal flow and                                                            partially-occlusive        +------------+----------+-------------+----------+-------------------------  ----+  Dist UA         72        0.35        0.42         minimal flow and                                                            partially-occlusive        +------------+----------+-------------+----------+-------------------------  ----+    Summary:  Occlusion / near occlusion of the left radial-cephalic fistula   Incision:  well healed incision Extremities:  palpable radial pulse, the fistula is pulsatile without thrill.       Assessment/Plan:  This is a 61 y.o. male who is s/p:left radial cephalic fistula that has failed to mature and is pulsatile.  I will schedule him for UE brachial cephalic fistula verses Basilic vein fistula.  The duplex shows impending occlusion.    I reviewed the pre op  vein mapping which demonstrated possible conduit of cephalic vein and basilic vein.  He agrees with this plan.  He is not on anticoagulation.  He states this is his back up plan and he is on a kidney transplant list.      Roxy Horseman PA-C [facility] [phone]   Call MD:  Donzetta Matters

## 2022-02-27 ENCOUNTER — Encounter (HOSPITAL_COMMUNITY): Payer: BC Managed Care – PPO

## 2022-02-27 ENCOUNTER — Ambulatory Visit (HOSPITAL_COMMUNITY)
Admission: RE | Admit: 2022-02-27 | Discharge: 2022-02-27 | Disposition: A | Discharge status: Home / Self Care | Payer: BC Managed Care – PPO | Source: Ambulatory Visit | Attending: Nephrology | Admitting: Nephrology

## 2022-02-27 VITALS — BP 129/65 | HR 76 | Temp 97.2°F | Resp 18

## 2022-02-27 DIAGNOSIS — N184 Chronic kidney disease, stage 4 (severe): Secondary | ICD-10-CM | POA: Diagnosis not present

## 2022-02-27 LAB — RENAL FUNCTION PANEL
Albumin: 3.7 g/dL (ref 3.5–5.0)
Anion gap: 14 (ref 5–15)
BUN: 63 mg/dL — ABNORMAL HIGH (ref 8–23)
CO2: 20 mmol/L — ABNORMAL LOW (ref 22–32)
Calcium: 8.8 mg/dL — ABNORMAL LOW (ref 8.9–10.3)
Chloride: 107 mmol/L (ref 98–111)
Creatinine, Ser: 5.88 mg/dL — ABNORMAL HIGH (ref 0.61–1.24)
GFR, Estimated: 10 mL/min — ABNORMAL LOW (ref >60)
Glucose, Bld: 153 mg/dL — ABNORMAL HIGH (ref 70–99)
Phosphorus: 4.4 mg/dL (ref 2.5–4.6)
Potassium: 3.8 mmol/L (ref 3.5–5.1)
Sodium: 141 mmol/L (ref 135–145)

## 2022-02-27 LAB — IRON AND TIBC
Iron: 63 ug/dL (ref 45–182)
Saturation Ratios: 29 % (ref 17.9–39.5)
TIBC: 214 ug/dL — ABNORMAL LOW (ref 250–450)
UIBC: 151 ug/dL

## 2022-02-27 LAB — POCT HEMOGLOBIN-HEMACUE: Hemoglobin: 8.5 g/dL — ABNORMAL LOW (ref 13.0–17.0)

## 2022-02-27 LAB — FERRITIN: Ferritin: 381 ng/mL — ABNORMAL HIGH (ref 24–336)

## 2022-02-27 MED ORDER — EPOETIN ALFA-EPBX 10000 UNIT/ML IJ SOLN
20000.0000 [IU] | INTRAMUSCULAR | Status: DC
  Administered 2022-02-27: 20000 [IU] via SUBCUTANEOUS

## 2022-02-27 MED ORDER — EPOETIN ALFA-EPBX 10000 UNIT/ML IJ SOLN
INTRAMUSCULAR | Status: AC
  Filled 2022-02-27: qty 2

## 2022-03-04 DIAGNOSIS — Z01810 Encounter for preprocedural cardiovascular examination: Secondary | ICD-10-CM | POA: Diagnosis not present

## 2022-03-04 DIAGNOSIS — N186 End stage renal disease: Secondary | ICD-10-CM | POA: Diagnosis not present

## 2022-03-04 DIAGNOSIS — I517 Cardiomegaly: Secondary | ICD-10-CM | POA: Diagnosis not present

## 2022-03-04 DIAGNOSIS — Z7682 Awaiting organ transplant status: Secondary | ICD-10-CM | POA: Diagnosis not present

## 2022-03-04 DIAGNOSIS — I358 Other nonrheumatic aortic valve disorders: Secondary | ICD-10-CM | POA: Diagnosis not present

## 2022-03-04 DIAGNOSIS — Z01818 Encounter for other preprocedural examination: Secondary | ICD-10-CM | POA: Diagnosis not present

## 2022-03-04 DIAGNOSIS — I080 Rheumatic disorders of both mitral and aortic valves: Secondary | ICD-10-CM | POA: Diagnosis not present

## 2022-03-04 DIAGNOSIS — I3481 Nonrheumatic mitral (valve) annulus calcification: Secondary | ICD-10-CM | POA: Diagnosis not present

## 2022-03-04 DIAGNOSIS — E1122 Type 2 diabetes mellitus with diabetic chronic kidney disease: Secondary | ICD-10-CM | POA: Diagnosis not present

## 2022-03-04 DIAGNOSIS — N189 Chronic kidney disease, unspecified: Secondary | ICD-10-CM | POA: Diagnosis not present

## 2022-03-04 DIAGNOSIS — I129 Hypertensive chronic kidney disease with stage 1 through stage 4 chronic kidney disease, or unspecified chronic kidney disease: Secondary | ICD-10-CM | POA: Diagnosis not present

## 2022-03-25 ENCOUNTER — Other Ambulatory Visit: Payer: Self-pay

## 2022-03-25 NOTE — Progress Notes (Addendum)
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 Patient denies having any s/s of Covid in his household, also denies any known exposure to Covid.   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 Dr. Mayer Masker PCP is Dr. Dwyane Dee, nephrologist is Dr. Johnnye Lana. I asked Karoline Caldwell- PA- C to review.

## 2022-03-26 ENCOUNTER — Ambulatory Visit (HOSPITAL_COMMUNITY): Payer: BC Managed Care – PPO | Admitting: Physician Assistant

## 2022-03-26 ENCOUNTER — Encounter (HOSPITAL_COMMUNITY): Payer: Self-pay | Admitting: Surgery

## 2022-03-26 ENCOUNTER — Other Ambulatory Visit: Payer: Self-pay

## 2022-03-26 ENCOUNTER — Ambulatory Visit (HOSPITAL_COMMUNITY)
Admission: RE | Admit: 2022-03-26 | Discharge: 2022-03-26 | Disposition: A | Discharge status: Home / Self Care | Payer: BC Managed Care – PPO | Attending: Surgery | Admitting: Surgery

## 2022-03-26 ENCOUNTER — Encounter (HOSPITAL_COMMUNITY)
Admission: RE | Disposition: A | Discharge status: Home / Self Care | Payer: Self-pay | Source: Home / Self Care | Attending: Surgery

## 2022-03-26 DIAGNOSIS — D759 Disease of blood and blood-forming organs, unspecified: Secondary | ICD-10-CM | POA: Insufficient documentation

## 2022-03-26 DIAGNOSIS — Z7682 Awaiting organ transplant status: Secondary | ICD-10-CM | POA: Diagnosis not present

## 2022-03-26 DIAGNOSIS — N185 Chronic kidney disease, stage 5: Secondary | ICD-10-CM | POA: Diagnosis not present

## 2022-03-26 DIAGNOSIS — I120 Hypertensive chronic kidney disease with stage 5 chronic kidney disease or end stage renal disease: Secondary | ICD-10-CM | POA: Insufficient documentation

## 2022-03-26 DIAGNOSIS — N184 Chronic kidney disease, stage 4 (severe): Secondary | ICD-10-CM | POA: Diagnosis not present

## 2022-03-26 DIAGNOSIS — I129 Hypertensive chronic kidney disease with stage 1 through stage 4 chronic kidney disease, or unspecified chronic kidney disease: Secondary | ICD-10-CM | POA: Diagnosis not present

## 2022-03-26 DIAGNOSIS — D649 Anemia, unspecified: Secondary | ICD-10-CM | POA: Insufficient documentation

## 2022-03-26 DIAGNOSIS — E1122 Type 2 diabetes mellitus with diabetic chronic kidney disease: Secondary | ICD-10-CM | POA: Diagnosis not present

## 2022-03-26 DIAGNOSIS — D631 Anemia in chronic kidney disease: Secondary | ICD-10-CM | POA: Diagnosis not present

## 2022-03-26 HISTORY — PX: AV FISTULA PLACEMENT: SHX1204

## 2022-03-26 LAB — NO BLOOD PRODUCTS

## 2022-03-26 LAB — POCT I-STAT, CHEM 8
BUN: 67 mg/dL — ABNORMAL HIGH (ref 8–23)
Calcium, Ion: 1.06 mmol/L — ABNORMAL LOW (ref 1.15–1.40)
Chloride: 113 mmol/L — ABNORMAL HIGH (ref 98–111)
Creatinine, Ser: 7 mg/dL — ABNORMAL HIGH (ref 0.61–1.24)
Glucose, Bld: 111 mg/dL — ABNORMAL HIGH (ref 70–99)
HCT: 27 % — ABNORMAL LOW (ref 39.0–52.0)
Hemoglobin: 9.2 g/dL — ABNORMAL LOW (ref 13.0–17.0)
Potassium: 4.2 mmol/L (ref 3.5–5.1)
Sodium: 142 mmol/L (ref 135–145)
TCO2: 19 mmol/L — ABNORMAL LOW (ref 22–32)

## 2022-03-26 LAB — GLUCOSE, CAPILLARY
Glucose-Capillary: 112 mg/dL — ABNORMAL HIGH (ref 70–99)
Glucose-Capillary: 109 mg/dL — ABNORMAL HIGH (ref 70–99)
Glucose-Capillary: 130 mg/dL — ABNORMAL HIGH (ref 70–99)

## 2022-03-26 SURGERY — ARTERIOVENOUS (AV) FISTULA CREATION
Anesthesia: Regional | Site: Arm Lower | Laterality: Left

## 2022-03-26 MED ORDER — HEPARIN 6000 UNIT IRRIGATION SOLUTION
Status: DC | PRN
  Administered 2022-03-26: 1

## 2022-03-26 MED ORDER — OXYCODONE HCL 5 MG/5ML PO SOLN: 5.0000 mg | Freq: Once | ORAL | Status: DC | PRN

## 2022-03-26 MED ORDER — LIDOCAINE-EPINEPHRINE (PF) 1.5 %-1:200000 IJ SOLN
INTRAMUSCULAR | Status: DC | PRN
  Administered 2022-03-26: 30 mL via PERINEURAL

## 2022-03-26 MED ORDER — LIDOCAINE 2% (20 MG/ML) 5 ML SYRINGE
INTRAMUSCULAR | Status: DC | PRN
  Administered 2022-03-26: 50 mg via INTRAVENOUS

## 2022-03-26 MED ORDER — 0.9 % SODIUM CHLORIDE (POUR BTL) OPTIME
TOPICAL | Status: DC | PRN
  Administered 2022-03-26: 1000 mL

## 2022-03-26 MED ORDER — CHLORHEXIDINE GLUCONATE 0.12 % MT SOLN
OROMUCOSAL | Status: AC
  Filled 2022-03-26: qty 15

## 2022-03-26 MED ORDER — OXYCODONE-ACETAMINOPHEN 5-325 MG PO TABS: 1.0000 | ORAL_TABLET | Freq: Four times a day (QID) | ORAL | 0 refills | Status: DC | PRN

## 2022-03-26 MED ORDER — PROMETHAZINE HCL 25 MG/ML IJ SOLN: 6.2500 mg | INTRAMUSCULAR | Status: DC | PRN

## 2022-03-26 MED ORDER — FENTANYL CITRATE (PF) 100 MCG/2ML IJ SOLN: 25.0000 ug | INTRAMUSCULAR | Status: DC | PRN

## 2022-03-26 MED ORDER — OXYCODONE HCL 5 MG PO TABS: 5.0000 mg | ORAL_TABLET | Freq: Once | ORAL | Status: DC | PRN

## 2022-03-26 MED ORDER — MIDAZOLAM HCL 2 MG/2ML IJ SOLN
INTRAMUSCULAR | Status: AC
  Filled 2022-03-26: qty 2

## 2022-03-26 MED ORDER — LIDOCAINE-EPINEPHRINE (PF) 1 %-1:200000 IJ SOLN
INTRAMUSCULAR | Status: AC
  Filled 2022-03-26: qty 30

## 2022-03-26 MED ORDER — CHLORHEXIDINE GLUCONATE 4 % EX LIQD: 60.0000 mL | Freq: Once | CUTANEOUS | Status: DC

## 2022-03-26 MED ORDER — FENTANYL CITRATE (PF) 100 MCG/2ML IJ SOLN: 50.0000 ug | Freq: Once | INTRAMUSCULAR | Status: AC

## 2022-03-26 MED ORDER — CHLORHEXIDINE GLUCONATE 0.12 % MT SOLN
15.0000 mL | Freq: Once | OROMUCOSAL | Status: AC
  Administered 2022-03-26: 15 mL via OROMUCOSAL

## 2022-03-26 MED ORDER — PROPOFOL 500 MG/50ML IV EMUL
INTRAVENOUS | Status: DC | PRN
  Administered 2022-03-26: 50 ug/kg/min via INTRAVENOUS

## 2022-03-26 MED ORDER — ACETAMINOPHEN 10 MG/ML IV SOLN: 1000.0000 mg | Freq: Once | INTRAVENOUS | Status: DC | PRN

## 2022-03-26 MED ORDER — HEPARIN 6000 UNIT IRRIGATION SOLUTION
Status: AC
  Filled 2022-03-26: qty 500

## 2022-03-26 MED ORDER — FENTANYL CITRATE (PF) 100 MCG/2ML IJ SOLN
INTRAMUSCULAR | Status: AC
  Administered 2022-03-26: 50 ug via INTRAVENOUS
  Filled 2022-03-26: qty 2

## 2022-03-26 MED ORDER — CEFAZOLIN SODIUM-DEXTROSE 2-4 GM/100ML-% IV SOLN
2.0000 g | INTRAVENOUS | Status: AC
  Administered 2022-03-26: 2 g via INTRAVENOUS
  Filled 2022-03-26: qty 100

## 2022-03-26 MED ORDER — SODIUM CHLORIDE 0.9 % IV SOLN: INTRAVENOUS | Status: DC

## 2022-03-26 SURGICAL SUPPLY — 34 items
ADH SKN CLS APL DERMABOND .7 (GAUZE/BANDAGES/DRESSINGS) ×1
ADH SKN CLS LQ APL DERMABOND (GAUZE/BANDAGES/DRESSINGS) ×1
ARMBAND PINK RESTRICT EXTREMIT (MISCELLANEOUS) ×2 IMPLANT
BAG COUNTER SPONGE SURGICOUNT (BAG) ×1 IMPLANT
BAG SPNG CNTER NS LX DISP (BAG) ×1
CANISTER SUCT 3000ML PPV (MISCELLANEOUS) ×1 IMPLANT
CLIP VESOCCLUDE MED 6/CT (CLIP) ×1 IMPLANT
CLIP VESOCCLUDE SM WIDE 6/CT (CLIP) ×1 IMPLANT
COVER PROBE W GEL 5X96 (DRAPES) ×1 IMPLANT
DERMABOND ADVANCED .7 DNX12 (GAUZE/BANDAGES/DRESSINGS) ×1 IMPLANT
DERMABOND ADVANCED .7 DNX6 (GAUZE/BANDAGES/DRESSINGS) IMPLANT
ELECT REM PT RETURN 9FT ADLT (ELECTROSURGICAL) ×1
ELECTRODE REM PT RTRN 9FT ADLT (ELECTROSURGICAL) ×1 IMPLANT
GLOVE SURG SS PI 7.5 STRL IVOR (GLOVE) ×3 IMPLANT
GOWN STRL REUS W/ TWL LRG LVL3 (GOWN DISPOSABLE) ×2 IMPLANT
GOWN STRL REUS W/ TWL XL LVL3 (GOWN DISPOSABLE) ×1 IMPLANT
GOWN STRL REUS W/TWL LRG LVL3 (GOWN DISPOSABLE) ×2
GOWN STRL REUS W/TWL XL LVL3 (GOWN DISPOSABLE) ×1
HEMOSTAT SNOW SURGICEL 2X4 (HEMOSTASIS) IMPLANT
KIT BASIN OR (CUSTOM PROCEDURE TRAY) ×1 IMPLANT
KIT TURNOVER KIT B (KITS) ×1 IMPLANT
NS IRRIG 1000ML POUR BTL (IV SOLUTION) ×1 IMPLANT
PACK CV ACCESS (CUSTOM PROCEDURE TRAY) ×1 IMPLANT
PAD ARMBOARD 7.5X6 YLW CONV (MISCELLANEOUS) ×2 IMPLANT
SLING ARM FOAM STRAP LRG (SOFTGOODS) IMPLANT
SLING ARM FOAM STRAP MED (SOFTGOODS) IMPLANT
SUT MNCRL AB 4-0 PS2 18 (SUTURE) IMPLANT
SUT PROLENE 6 0 CC (SUTURE) ×1 IMPLANT
SUT VIC AB 3-0 SH 27 (SUTURE) ×1
SUT VIC AB 3-0 SH 27X BRD (SUTURE) ×1 IMPLANT
SUT VICRYL 4-0 PS2 18IN ABS (SUTURE) ×1 IMPLANT
TOWEL GREEN STERILE (TOWEL DISPOSABLE) ×1 IMPLANT
UNDERPAD 30X36 HEAVY ABSORB (UNDERPADS AND DIAPERS) ×1 IMPLANT
WATER STERILE IRR 1000ML POUR (IV SOLUTION) ×1 IMPLANT

## 2022-03-26 NOTE — Discharge Instructions (Signed)
   [facility]  Discharge Instructions  AV Fistula or Graft Surgery for Dialysis Access  Please refer to the following instructions for your post-procedure care. Your surgeon or physician assistant will discuss any changes with you.  Activity  You may drive the day following your surgery, if you are comfortable and no longer taking prescription pain medication. Resume full activity as the soreness in your incision resolves.  Bathing/Showering  You may shower after you go home. Keep your incision dry for 48 hours. Do not soak in a bathtub, hot tub, or swim until the incision heals completely. You may not shower if you have a hemodialysis catheter.  Incision Care  Clean your incision with mild soap and water after 48 hours. Pat the area dry with a clean towel. You do not need a bandage unless otherwise instructed. Do not apply any ointments or creams to your incision. You may have skin glue on your incision. Do not peel it off. It will come off on its own in about one week. Your arm may swell a bit after surgery. To reduce swelling use pillows to elevate your arm so it is above your heart. Your doctor will tell you if you need to lightly wrap your arm with an ACE bandage.  Diet  Resume your normal diet. There are not special food restrictions following this procedure. In order to heal from your surgery, it is CRITICAL to get adequate nutrition. Your body requires vitamins, minerals, and protein. Vegetables are the best source of vitamins and minerals. Vegetables also provide the perfect balance of protein. Processed food has little nutritional value, so try to avoid this.  Medications  Resume taking all of your medications. If your incision is causing pain, you may take over-the counter pain relievers such as acetaminophen (Tylenol). If you were prescribed a stronger pain medication, please be aware these medications can cause nausea and constipation. Prevent  nausea by taking the medication with a snack or meal. Avoid constipation by drinking plenty of fluids and eating foods with high amount of fiber, such as fruits, vegetables, and grains.  Do not take Tylenol if you are taking prescription pain medications.  Follow up Your surgeon may want to see you in the office following your access surgery. If so, this will be arranged at the time of your surgery.  Please call us immediately for any of the following conditions:  Increased pain, redness, drainage (pus) from your incision site Fever of 101 degrees or higher Severe or worsening pain at your incision site Hand pain or numbness.  Reduce your risk of vascular disease:  Stop smoking. If you would like help, call [facility] at 1-800-QUIT-NOW 816-472-6384) or [place] [facility] [place] your cholesterol Maintain a desired weight Control your diabetes Keep your blood pressure down  Dialysis  It will take several weeks to several months for your new dialysis access to be ready for use. Your surgeon will determine when it is okay to use it. Your nephrologist will continue to direct your dialysis. You can continue to use your Permcath until your new access is ready for use.   03/26/2022 Roberto Alley MD [id number] 1960-11-25  Surgeon(s): Serafina Mitchell, MD  Procedure(s): Creation left 1st stage basilic vein transposition  x Do not stick fistula for 12 weeks    If you have any questions, please call the office at [phone].

## 2022-03-26 NOTE — Anesthesia Preprocedure Evaluation (Addendum)
Anesthesia Evaluation  Patient identified by MRN, date of birth, ID band Patient awake    Reviewed: Allergy & Precautions, NPO status , Patient's Chart, lab work & pertinent test results  Airway Mallampati: III  TM Distance: >3 FB Neck ROM: Full    Dental no notable dental hx.    Pulmonary neg pulmonary ROS   Pulmonary exam normal        Cardiovascular hypertension, Pt. on medications and Pt. on home beta blockers Normal cardiovascular exam     Neuro/Psych Blind    GI/Hepatic negative GI ROS, Neg liver ROS,,,  Endo/Other  diabetes    Renal/GU ESRFRenal disease     Musculoskeletal negative musculoskeletal ROS (+)    Abdominal   Peds  Hematology  (+) Blood dyscrasia, anemia , REFUSES BLOOD PRODUCTS  Anesthesia Other Findings CKD IV  Reproductive/Obstetrics                             Anesthesia Physical Anesthesia Plan  ASA: 3  Anesthesia Plan: Regional   Post-op Pain Management:    Induction: Intravenous  PONV Risk Score and Plan: 1 and Ondansetron, Dexamethasone and Amisulpride  Airway Management Planned: Simple Face Mask  Additional Equipment:   Intra-op Plan:   Post-operative Plan:   Informed Consent: I have reviewed the patients History and Physical, chart, labs and discussed the procedure including the risks, benefits and alternatives for the proposed anesthesia with the patient or authorized representative who has indicated his/her understanding and acceptance.     Dental advisory given  Plan Discussed with: CRNA  Anesthesia Plan Comments:        Anesthesia Quick Evaluation

## 2022-03-26 NOTE — Transfer of Care (Signed)
Immediate Anesthesia Transfer of Care Note  Patient: Roberto Alley MD  Procedure(s) Performed: LEFT UPPER EXTREMITY BRACHIOCEPHALIC ARTERIOVENOUS (AV) FISTULA CREATION (Left: Arm Lower)  Patient Location: PACU  Anesthesia Type:MAC and Regional  Level of Consciousness: awake, alert , and oriented  Airway & Oxygen Therapy: Patient Spontanous Breathing  Post-op Assessment: Report given to RN and Post -op Vital signs reviewed and stable  Post vital signs: Reviewed and stable  Last Vitals:  Vitals Value Taken Time  BP 168/69 03/26/22 1500  Temp    Pulse 70 03/26/22 1500  Resp 14 03/26/22 1500  SpO2 98 % 03/26/22 1500  Vitals shown include unvalidated device data.  Last Pain:  Vitals:   03/26/22 1039  TempSrc:   PainSc: 0-No pain         Complications: No notable events documented.

## 2022-03-26 NOTE — Anesthesia Procedure Notes (Signed)
Anesthesia Regional Block: Supraclavicular block   Pre-Anesthetic Checklist: , timeout performed,  Correct Patient, Correct Site, Correct Laterality,  Correct Procedure, Correct Position, site marked,  Risks and benefits discussed,  Surgical consent,  Pre-op evaluation,  At surgeon's request and post-op pain management  Laterality: Left  Prep: chloraprep       Needles:  Injection technique: Single-shot  Needle Type: Echogenic Stimulator Needle     Needle Length: 9cm  Needle Gauge: 21     Additional Needles:   Procedures:,,,, ultrasound used (permanent image in chart),,    Narrative:  Start time: 03/26/2022 1:10 PM End time: 03/26/2022 1:20 PM Injection made incrementally with aspirations every 5 mL.  Performed by: Personally  Anesthesiologist: Murvin Natal, MD  Additional Notes: Functioning IV was confirmed and monitors were applied.  A timeout was performed. Sterile prep, hand hygiene and sterile gloves were used. A 44m 21ga Arrow echogenic stimulator needle was used. Negative aspiration and negative test dose prior to incremental administration of local anesthetic. The patient tolerated the procedure well.  Ultrasound guidance: relevent anatomy identified, needle position confirmed, local anesthetic spread visualized around nerve(s), vascular puncture avoided.  Image printed for medical record.

## 2022-03-26 NOTE — Anesthesia Procedure Notes (Signed)
Procedure Name: MAC Date/Time: 03/26/2022 1:47 PM  Performed by: Mariea Clonts, CRNAPre-anesthesia Checklist: Patient identified, Emergency Drugs available, Suction available, Patient being monitored and Timeout performed Patient Re-evaluated:Patient Re-evaluated prior to induction Oxygen Delivery Method: Simple face mask

## 2022-03-26 NOTE — H&P (Signed)
POST OPERATIVE OFFICE NOTE       CC:  F/u for surgery   HPI:  This is a 61 y.o. male who is s/p left RC av fistula  on 12/25/21 by Dr. Trula Slade.     Pt returns today for follow up.  Pt states he can feel a pulse in the left forearm.  He denies steal symptoms of pain, loss of motors or loss of sensation in the left UE.  He is not currently on HD he is CKD stage IV.           Allergies  Allergen Reactions   Contrast Media [Iodinated Contrast Media] Rash            Current Outpatient Medications  Medication Sig Dispense Refill   amLODipine (NORVASC) 10 MG tablet Take 5 mg by mouth at bedtime.        aspirin EC 81 MG tablet Take 81 mg by mouth daily.       atropine 1 % ophthalmic solution Place 1 drop into the right eye daily.       brimonidine (ALPHAGAN) 0.2 % ophthalmic solution Place 1 drop into both eyes in the morning and at bedtime.       cabergoline (DOSTINEX) 0.5 MG tablet TAKE 1/2 TABLET TWICE A WEEK 12 tablet 2   calcitRIOL (ROCALTROL) 0.25 MCG capsule Take 0.25 mcg by mouth daily.       carvedilol (COREG) 25 MG tablet Take 25 mg by mouth 2 (two) times daily with a meal.       cloNIDine (CATAPRES - DOSED IN MG/24 HR) 0.3 mg/24hr patch Place 0.3 mg onto the skin once a week.       cloNIDine (CATAPRES) 0.1 MG tablet Take 0.1 mg by mouth 3 (three) times daily as needed (Breakthrough hypertension).       hydrALAZINE (APRESOLINE) 50 MG tablet Take 25 mg by mouth 3 (three) times daily.       isosorbide mononitrate (IMDUR) 60 MG 24 hr tablet Take 60 mg by mouth daily.       oxyCODONE-acetaminophen (PERCOCET) 5-325 MG tablet Take 1 tablet by mouth every 6 (six) hours as needed for severe pain. 8 tablet 0   timolol (BETIMOL) 0.25 % ophthalmic solution Place 1-2 drops into both eyes 2 (two) times daily.       Vitamin D, Ergocalciferol, (DRISDOL) 1.25 MG (50000 UNIT) CAPS capsule TAKE 1 CAPSULE (50,000 UNITS TOTAL) BY MOUTH EVERY 7 (SEVEN) DAYS (Patient taking differently: Take 50,000  Units by mouth every Monday.) 12 capsule 2             Current Facility-Administered Medications  Medication Dose Route Frequency Provider Last Rate Last Admin   0.9 %  sodium chloride infusion  500 mL Intravenous Continuous Armbruster, Carlota Raspberry, MD           ROS:  See HPI   Physical Exam:   Findings:  +--------------------+----------+-----------------+--------+  AVF                PSV (cm/s)Flow Vol (mL/min)Comments  +--------------------+----------+-----------------+--------+  Native artery inflow    80           26                  +--------------------+----------+-----------------+--------+  AVF Anastomosis        132                               +--------------------+----------+-----------------+--------+     +------------+----------+-------------+----------+-------------------------  ----+  OUTFLOW VEINPSV (cm/s)Diameter (cm)Depth (cm)          Describe              +------------+----------+-------------+----------+-------------------------  ----+  Mid UA          27        0.34        0.22         minimal flow and                                                            partially-occlusive        +------------+----------+-------------+----------+-------------------------  ----+  Dist UA         72        0.35        0.42         minimal flow and                                                            partially-occlusive        +------------+----------+-------------+----------+-------------------------  ----+    Summary:  Occlusion / near occlusion of the left radial-cephalic fistula    Incision:  well healed incision Extremities:  palpable radial pulse, the fistula is pulsatile without thrill.           Assessment/Plan:  This is a 61 y.o. male who is s/p:left radial cephalic fistula that has failed to mature and is pulsatile.  I will schedule him for UE brachial cephalic fistula verses Basilic vein fistula.   The duplex shows impending occlusion.               I reviewed the pre op vein mapping which demonstrated possible conduit of cephalic vein and basilic vein.  He agrees with this plan.  He is not on anticoagulation.  He states this is his back up plan and he is on a kidney transplant list.         Roxy Horseman PA-C [facility] [phone]

## 2022-03-27 ENCOUNTER — Encounter (HOSPITAL_COMMUNITY): Payer: Self-pay | Admitting: Surgery

## 2022-03-27 ENCOUNTER — Encounter (HOSPITAL_COMMUNITY): Payer: BC Managed Care – PPO

## 2022-03-27 NOTE — Op Note (Signed)
    Patient name: Charls Custer MD MRN: [id number] DOB: [birth year] Sex: male  03/26/2022 Pre-operative Diagnosis: CD $ Post-operative diagnosis:  Same Surgeon:  Annamarie Major Assistants:  Leontine Locket, PA Procedure:   First stage left basilic vein fistula Anesthesia:  regional Blood Loss:  minimal Specimens:  none  Findings: The cephalic vein appeared to have areas that I felt were marginal for fistula creation and since he failed a radiocephalic fistula, I elected to use his basilic vein which was of excellent caliber measuring at least 5 mm.  Indications: The patient has a history of a failed left radiocephalic fistula.  He comes in today for new access.  Procedure:  The patient was identified in the holding area and taken to [place] 11  The patient was then placed supine on the table. regional anesthesia was administered.  The patient was prepped and draped in the usual sterile fashion.  A time out was called and antibiotics were administered.  A PA was necessary to expedite the procedure and assist with technical details.  She helped with exposure by providing suction and retracting, as well as suture following for the anastomosis.  Ultrasound was used to evaluate the cephalic vein in the upper arm.  I did not feel that this was his best option as there were areas that I felt were too small for fistula creation.  His basilic vein however looked excellent measuring about 5 mm throughout.  A transverse incision was made just proximal to the antecubital crease.  I first dissected out the brachial artery which was a 4 mm disease-free artery.  This was encircled with Vesseloops.  I then identified the basilic vein and mobilize it throughout the width of the incision, ligating side branches between silk ties.  The nerve was protected.  The vein was marked for orientation and then ligated distally.  It distended nicely with heparin saline.  Next, the brachial artery was occluded with vascular  clamps and a #11 blade was used to make an arteriotomy which was extended longitudinally with Potts scissors.  The vein was then spatulated to fit the size of the arteriotomy in a running anastomosis was created with 6-0 Prolene.  Prior to completion the appropriate flushing maneuvers were performed and the anastomosis was completed.  The patient had a multiphasic radial artery signal and a excellent signal within the fistula.  The wound was then irrigated.  Hemostasis was achieved.  The incision was closed with a deep layer 3-0 Vicryl and a subcuticular 4-0 Monocryl closure with Dermabond.  There were no complications   Disposition: To PACU stable.   Theotis Burrow, M.D., [facility] [place] Office: [phone] Pager:  [phone]

## 2022-03-28 ENCOUNTER — Ambulatory Visit (HOSPITAL_COMMUNITY)
Admission: RE | Admit: 2022-03-28 | Discharge: 2022-03-28 | Disposition: A | Discharge status: Home / Self Care | Payer: BC Managed Care – PPO | Source: Ambulatory Visit | Attending: Nephrology | Admitting: Nephrology

## 2022-03-28 VITALS — BP 122/56 | HR 70 | Temp 97.5°F | Resp 17

## 2022-03-28 DIAGNOSIS — N184 Chronic kidney disease, stage 4 (severe): Secondary | ICD-10-CM | POA: Diagnosis not present

## 2022-03-28 LAB — RENAL FUNCTION PANEL
Albumin: 3.5 g/dL (ref 3.5–5.0)
Anion gap: 10 (ref 5–15)
BUN: 69 mg/dL — ABNORMAL HIGH (ref 8–23)
CO2: 18 mmol/L — ABNORMAL LOW (ref 22–32)
Calcium: 8.3 mg/dL — ABNORMAL LOW (ref 8.9–10.3)
Chloride: 111 mmol/L (ref 98–111)
Creatinine, Ser: 6.08 mg/dL — ABNORMAL HIGH (ref 0.61–1.24)
GFR, Estimated: 10 mL/min — ABNORMAL LOW (ref >60)
Glucose, Bld: 141 mg/dL — ABNORMAL HIGH (ref 70–99)
Phosphorus: 4.9 mg/dL — ABNORMAL HIGH (ref 2.5–4.6)
Potassium: 3.9 mmol/L (ref 3.5–5.1)
Sodium: 139 mmol/L (ref 135–145)

## 2022-03-28 LAB — IRON AND TIBC
Iron: 42 ug/dL — ABNORMAL LOW (ref 45–182)
Saturation Ratios: 21 % (ref 17.9–39.5)
TIBC: 204 ug/dL — ABNORMAL LOW (ref 250–450)
UIBC: 162 ug/dL

## 2022-03-28 LAB — FERRITIN: Ferritin: 257 ng/mL (ref 24–336)

## 2022-03-28 LAB — POCT HEMOGLOBIN-HEMACUE: Hemoglobin: 8.7 g/dL — ABNORMAL LOW (ref 13.0–17.0)

## 2022-03-28 MED ORDER — EPOETIN ALFA-EPBX 10000 UNIT/ML IJ SOLN
INTRAMUSCULAR | Status: AC
  Filled 2022-03-28: qty 2

## 2022-03-28 MED ORDER — EPOETIN ALFA-EPBX 10000 UNIT/ML IJ SOLN
20000.0000 [IU] | INTRAMUSCULAR | Status: DC
  Administered 2022-03-28: 20000 [IU] via SUBCUTANEOUS

## 2022-03-28 MED ORDER — EPOETIN ALFA-EPBX 10000 UNIT/ML IJ SOLN
INTRAMUSCULAR | Status: AC
  Filled 2022-03-28: qty 1

## 2022-03-28 NOTE — Anesthesia Postprocedure Evaluation (Signed)
Anesthesia Post Note  Patient: Roberto Alley MD  Procedure(s) Performed: LEFT UPPER EXTREMITY BRACHIOCEPHALIC ARTERIOVENOUS (AV) FISTULA CREATION (Left: Arm Lower)     Patient location during evaluation: PACU Anesthesia Type: Regional Level of consciousness: awake Pain management: pain level controlled Vital Signs Assessment: post-procedure vital signs reviewed and stable Respiratory status: spontaneous breathing, nonlabored ventilation and respiratory function stable Cardiovascular status: stable and blood pressure returned to baseline Postop Assessment: no apparent nausea or vomiting Anesthetic complications: no   No notable events documented.  Last Vitals:  Vitals:   03/26/22 1515 03/26/22 1530  BP: (!) 144/74 (!) 160/79  Pulse: 70 71  Resp: 10 11  Temp:  36.7 C  SpO2: 96% 96%    Last Pain:  Vitals:   03/26/22 1530  TempSrc:   PainSc: 0-No pain                 Niv Darley P Terrea Bruster

## 2022-03-29 LAB — PTH, INTACT AND CALCIUM
Calcium, Total (PTH): 8.4 mg/dL — ABNORMAL LOW (ref 8.6–10.2)
PTH: 145 pg/mL — ABNORMAL HIGH (ref 15–65)

## 2022-04-17 DIAGNOSIS — I10 Essential (primary) hypertension: Secondary | ICD-10-CM | POA: Diagnosis not present

## 2022-04-17 DIAGNOSIS — Z961 Presence of intraocular lens: Secondary | ICD-10-CM | POA: Diagnosis not present

## 2022-04-17 DIAGNOSIS — E103593 Type 1 diabetes mellitus with proliferative diabetic retinopathy without macular edema, bilateral: Secondary | ICD-10-CM | POA: Diagnosis not present

## 2022-04-17 DIAGNOSIS — H4051X3 Glaucoma secondary to other eye disorders, right eye, severe stage: Secondary | ICD-10-CM | POA: Diagnosis not present

## 2022-04-22 ENCOUNTER — Other Ambulatory Visit: Payer: Self-pay | Admitting: *Deleted

## 2022-04-22 DIAGNOSIS — N184 Chronic kidney disease, stage 4 (severe): Secondary | ICD-10-CM

## 2022-04-24 ENCOUNTER — Encounter (HOSPITAL_COMMUNITY): Payer: BC Managed Care – PPO

## 2022-04-25 ENCOUNTER — Encounter (HOSPITAL_COMMUNITY): Payer: BC Managed Care – PPO

## 2022-04-28 DIAGNOSIS — H4053X4 Glaucoma secondary to other eye disorders, bilateral, indeterminate stage: Secondary | ICD-10-CM | POA: Diagnosis not present

## 2022-04-29 DIAGNOSIS — N185 Chronic kidney disease, stage 5: Secondary | ICD-10-CM | POA: Diagnosis not present

## 2022-04-29 DIAGNOSIS — I10 Essential (primary) hypertension: Secondary | ICD-10-CM | POA: Diagnosis not present

## 2022-04-29 DIAGNOSIS — E119 Type 2 diabetes mellitus without complications: Secondary | ICD-10-CM | POA: Diagnosis not present

## 2022-04-29 DIAGNOSIS — N2581 Secondary hyperparathyroidism of renal origin: Secondary | ICD-10-CM | POA: Diagnosis not present

## 2022-05-01 ENCOUNTER — Encounter (HOSPITAL_COMMUNITY)
Admission: RE | Admit: 2022-05-01 | Discharge: 2022-05-01 | Disposition: A | Discharge status: Home / Self Care | Payer: BC Managed Care – PPO | Source: Ambulatory Visit | Attending: Nephrology | Admitting: Nephrology

## 2022-05-01 VITALS — BP 159/72 | HR 72 | Temp 97.2°F | Resp 18

## 2022-05-01 DIAGNOSIS — N184 Chronic kidney disease, stage 4 (severe): Secondary | ICD-10-CM | POA: Diagnosis not present

## 2022-05-01 LAB — RENAL FUNCTION PANEL
Albumin: 3.4 g/dL — ABNORMAL LOW (ref 3.5–5.0)
Anion gap: 4 — ABNORMAL LOW (ref 5–15)
BUN: 64 mg/dL — ABNORMAL HIGH (ref 8–23)
CO2: 21 mmol/L — ABNORMAL LOW (ref 22–32)
Calcium: 8.3 mg/dL — ABNORMAL LOW (ref 8.9–10.3)
Chloride: 109 mmol/L (ref 98–111)
Creatinine, Ser: 5.64 mg/dL — ABNORMAL HIGH (ref 0.61–1.24)
GFR, Estimated: 11 mL/min — ABNORMAL LOW (ref >60)
Glucose, Bld: 145 mg/dL — ABNORMAL HIGH (ref 70–99)
Phosphorus: 3.9 mg/dL (ref 2.5–4.6)
Potassium: 3.8 mmol/L (ref 3.5–5.1)
Sodium: 134 mmol/L — ABNORMAL LOW (ref 135–145)

## 2022-05-01 LAB — IRON AND TIBC
Iron: 56 ug/dL (ref 45–182)
Saturation Ratios: 26 % (ref 17.9–39.5)
TIBC: 216 ug/dL — ABNORMAL LOW (ref 250–450)
UIBC: 160 ug/dL

## 2022-05-01 LAB — POCT HEMOGLOBIN-HEMACUE: Hemoglobin: 9.3 g/dL — ABNORMAL LOW (ref 13.0–17.0)

## 2022-05-01 LAB — FERRITIN: Ferritin: 204 ng/mL (ref 24–336)

## 2022-05-01 MED ORDER — EPOETIN ALFA-EPBX 10000 UNIT/ML IJ SOLN
20000.0000 [IU] | INTRAMUSCULAR | Status: DC
  Administered 2022-05-01: 20000 [IU] via SUBCUTANEOUS

## 2022-05-01 MED ORDER — EPOETIN ALFA-EPBX 10000 UNIT/ML IJ SOLN
INTRAMUSCULAR | Status: AC
  Filled 2022-05-01: qty 2

## 2022-05-02 ENCOUNTER — Other Ambulatory Visit: Payer: Self-pay | Admitting: Endocrinology

## 2022-05-05 ENCOUNTER — Ambulatory Visit (HOSPITAL_COMMUNITY)
Admission: RE | Admit: 2022-05-05 | Discharge: 2022-05-05 | Disposition: A | Discharge status: Home / Self Care | Payer: BC Managed Care – PPO | Source: Ambulatory Visit | Attending: Surgery | Admitting: Surgery

## 2022-05-05 ENCOUNTER — Ambulatory Visit (INDEPENDENT_AMBULATORY_CARE_PROVIDER_SITE_OTHER): Payer: BC Managed Care – PPO | Admitting: Physician Assistant

## 2022-05-05 VITALS — BP 153/75 | HR 71 | Temp 98.6°F | Resp 20 | Ht 72.0 in | Wt 231.0 lb

## 2022-05-05 DIAGNOSIS — N184 Chronic kidney disease, stage 4 (severe): Secondary | ICD-10-CM

## 2022-05-07 ENCOUNTER — Other Ambulatory Visit: Payer: Self-pay

## 2022-05-07 DIAGNOSIS — N184 Chronic kidney disease, stage 4 (severe): Secondary | ICD-10-CM

## 2022-05-08 NOTE — Progress Notes (Signed)
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 Trula Slade.  This was done to treat access for future dialysis needs.  He has a history of left radiocephalic fistula creation in September 2023, which unfortunately failed to mature.  Pt returns today for follow up.  Pt states he is felt great since surgery.  He denies any pain in his incision site or difficulties with healing.  He still does not require dialysis.  He denies any pain in his left hand, difficulties with grip strength, or excessive coldness.   Allergies  Allergen Reactions   Contrast Media [Iodinated Contrast Media] Rash    Current Outpatient Medications  Medication Sig Dispense Refill   acetaminophen (TYLENOL) 500 MG tablet Take 500-1,000 mg by mouth every 4 (four) hours as needed for moderate pain.     aspirin EC 81 MG tablet Take 81 mg by mouth daily.     atropine 1 % ophthalmic solution Place 1 drop into the right eye daily.     brimonidine (ALPHAGAN) 0.2 % ophthalmic solution Place 1 drop into both eyes in the morning and at bedtime.     cabergoline (DOSTINEX) 0.5 MG tablet TAKE 1/2 TABLET TWICE A WEEK (Patient taking differently: Take 0.25-0.5 mg by mouth See admin instructions. Take one tablet (0.5 mg) by mouth on Tuesday's and one half tablet (0.25 mg) on Friday's) 12 tablet 2   calcitRIOL (ROCALTROL) 0.25 MCG capsule Take 0.25 mcg by mouth daily.     cloNIDine (CATAPRES - DOSED IN MG/24 HR) 0.3 mg/24hr patch Place 0.3 mg onto the skin once a week. Changes on Friday.     cloNIDine (CATAPRES) 0.1 MG tablet Take 0.1 mg by mouth 3 (three) times daily as needed (Breakthrough hypertension).     dorzolamide-timolol (COSOPT) 2-0.5 % ophthalmic solution Place 1 drop into both eyes 2 (two) times daily.     hydrALAZINE (APRESOLINE) 25 MG tablet Take 25 mg by mouth 3 (three) times daily.     isosorbide  mononitrate (IMDUR) 60 MG 24 hr tablet Take 60 mg by mouth daily. AM     Multiple Vitamins-Minerals (MULTIVITAL PO) Take 1 tablet by mouth daily.     nebivolol (BYSTOLIC) 5 MG tablet Take 5 mg by mouth at bedtime.     NIFEdipine (ADALAT CC) 30 MG 24 hr tablet Take 30 mg by mouth daily. AM     oxyCODONE-acetaminophen (PERCOCET) 5-325 MG tablet Take 1 tablet by mouth every 6 (six) hours as needed for severe pain. 8 tablet 0   timolol (BETIMOL) 0.25 % ophthalmic solution Place 1-2 drops into both eyes 2 (two) times daily.     VITAMIN A PO Take 1 tablet by mouth daily.     Vitamin D, Ergocalciferol, (DRISDOL) 1.25 MG (50000 UNIT) CAPS capsule TAKE 1 CAPSULE (50,000 UNITS TOTAL) BY MOUTH EVERY 7 (SEVEN) DAYS 12 capsule 2   Current Facility-Administered Medications  Medication Dose Route Frequency Provider Last Rate Last Admin   0.9 %  sodium chloride infusion  500 mL Intravenous Continuous Armbruster, Carlota Raspberry, MD         ROS:  See HPI  Physical Exam:  Incision: Left AC fossa incision well-healed with no signs of infection. Extremities: Left brachiobasilic fistula with good thrill on palpation, minimal pulsatility Neuro: Intact motor and sensation of left upper extremity  Studies:  LUE Dialysis Duplex (05/05/2022) Left upper extremity brachiobasilic fistula is patent.  Inflow volume of 2164 mL/min.  PSV at anastomosis site of 553 cm/s.  Diameter of fistula at [facility] fossa is 0.40 cm.  Largest diameter is in proximal upper arm, 0.57 cm.  Depth of the fistula greater than 0.6 cm throughout the arm.  Area of stenosis in the distal portion of the fistula, PSV of 724 cm/s.    Assessment/Plan:  This is a 62 y.o. male who is s/p: Creation of left brachiobasilic fistula on 70/78/6754  -The patient's left upper extremity incision is well-healed.  There are no signs of infection. -On exam the fistula has a good thrill on palpation with minimal pulsatility.  Duplex study demonstrates a patent fistula that  is progressing in maturity.  There is an area of stenosis in the distal portion of the fistula with a PSV of 724 cm/s.  This area may need to be treated with balloon angioplasty prior to second stage procedure -The patient has no symptoms of steal.  He has a palpable left radial pulse. -At this time we will pursue conservative treatment.  I have encouraged the patient to exercise his left hand and arm to see if his fistula can increase in size.  The patient will follow-up with our office in 6 weeks with repeat duplex study.  If his fistula has not improved maturity, or there is significant stenosis, he may require fistulogram prior to second stage procedure   Vicente Serene, PA-C [facility] MD:  Trula Slade

## 2022-05-19 ENCOUNTER — Other Ambulatory Visit: Payer: Self-pay | Admitting: Endocrinology

## 2022-05-22 ENCOUNTER — Encounter (HOSPITAL_COMMUNITY): Payer: BC Managed Care – PPO

## 2022-05-29 ENCOUNTER — Encounter (HOSPITAL_COMMUNITY)
Admission: RE | Admit: 2022-05-29 | Discharge: 2022-05-29 | Disposition: A | Discharge status: Home / Self Care | Payer: BC Managed Care – PPO | Source: Ambulatory Visit | Attending: Nephrology | Admitting: Nephrology

## 2022-05-29 VITALS — BP 140/68 | HR 68 | Temp 97.4°F

## 2022-05-29 DIAGNOSIS — N184 Chronic kidney disease, stage 4 (severe): Secondary | ICD-10-CM

## 2022-05-29 LAB — POCT HEMOGLOBIN-HEMACUE: Hemoglobin: 9.5 g/dL — ABNORMAL LOW (ref 13.0–17.0)

## 2022-05-29 MED ORDER — EPOETIN ALFA-EPBX 40000 UNIT/ML IJ SOLN
INTRAMUSCULAR | Status: AC
  Administered 2022-05-29: 40000 [IU]
  Filled 2022-05-29: qty 1

## 2022-05-29 MED ORDER — EPOETIN ALFA-EPBX 10000 UNIT/ML IJ SOLN: 20000.0000 [IU] | INTRAMUSCULAR | Status: DC

## 2022-06-16 ENCOUNTER — Ambulatory Visit (INDEPENDENT_AMBULATORY_CARE_PROVIDER_SITE_OTHER): Payer: BC Managed Care – PPO | Admitting: Physician Assistant

## 2022-06-16 ENCOUNTER — Ambulatory Visit (HOSPITAL_COMMUNITY)
Admission: RE | Admit: 2022-06-16 | Discharge: 2022-06-16 | Disposition: A | Discharge status: Home / Self Care | Payer: BC Managed Care – PPO | Source: Ambulatory Visit | Attending: Surgery | Admitting: Surgery

## 2022-06-16 ENCOUNTER — Encounter: Payer: Self-pay | Admitting: Physician Assistant

## 2022-06-16 VITALS — BP 146/70 | HR 70 | Temp 97.8°F | Resp 18 | Ht 71.5 in | Wt 228.0 lb

## 2022-06-16 DIAGNOSIS — N184 Chronic kidney disease, stage 4 (severe): Secondary | ICD-10-CM

## 2022-06-16 NOTE — Progress Notes (Signed)
POST OPERATIVE OFFICE NOTE    CC:  F/u for surgery  HPI:  This is a 62 y.o. male who is s/p 1st stage left BVT on 03/26/2022 by Dr. Trula Slade.   He has hx of left RC AVF on 12/25/2021 also by Dr. Trula Slade. At his post op visit, this had occluded.    He was seen 05/05/2022 and his fistula was maturing, but there was an area of stenosis with PSV of 724 cm/s.  Given he was not yet on HD, conservative treatment was continued.  He was scheduled for follow up duplex.   Pt states he does not have pain/numbness in the left hand.  He states he did push ups to help his fistula mature.  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   The pt is not yet on dialysis.  He is undergoing testing for transplant.    Allergies  Allergen Reactions   Contrast Media [Iodinated Contrast Media] Rash    Current Outpatient Medications  Medication Sig Dispense Refill   acetaminophen (TYLENOL) 500 MG tablet Take 500-1,000 mg by mouth every 4 (four) hours as needed for moderate pain.     aspirin EC 81 MG tablet Take 81 mg by mouth daily.     atropine 1 % ophthalmic solution Place 1 drop into the right eye daily.     brimonidine (ALPHAGAN) 0.2 % ophthalmic solution Place 1 drop into both eyes in the morning and at bedtime.     cabergoline (DOSTINEX) 0.5 MG tablet Take 0.5-1 tablets (0.25-0.5 mg total) by mouth See admin instructions. Take one tablet (0.5 mg) by mouth on Tuesday's and one half tablet (0.25 mg) on Friday's 12 tablet 2   calcitRIOL (ROCALTROL) 0.25 MCG capsule Take 0.25 mcg by mouth daily.     cloNIDine (CATAPRES - DOSED IN MG/24 HR) 0.3 mg/24hr patch Place 0.3 mg onto the skin once a week. Changes on Friday.     cloNIDine (CATAPRES) 0.1 MG tablet Take 0.1 mg by mouth 3 (three) times daily as needed (Breakthrough hypertension).     dorzolamide-timolol (COSOPT) 2-0.5 % ophthalmic solution Place 1 drop into both eyes 2 (two) times daily.      hydrALAZINE (APRESOLINE) 25 MG tablet Take 25 mg by mouth 3 (three) times daily.     isosorbide mononitrate (IMDUR) 60 MG 24 hr tablet Take 60 mg by mouth daily. AM     Multiple Vitamins-Minerals (MULTIVITAL PO) Take 1 tablet by mouth daily.     nebivolol (BYSTOLIC) 5 MG tablet Take 5 mg by mouth at bedtime.     NIFEdipine (ADALAT CC) 30 MG 24 hr tablet Take 30 mg by mouth daily. AM     oxyCODONE-acetaminophen (PERCOCET) 5-325 MG tablet Take 1 tablet by mouth every 6 (six) hours as needed for severe pain. 8 tablet 0   timolol (BETIMOL) 0.25 % ophthalmic solution Place 1-2 drops into both eyes 2 (two) times daily.     VITAMIN A PO Take 1 tablet by mouth daily.     Vitamin D, Ergocalciferol, (DRISDOL) 1.25 MG (50000 UNIT) CAPS capsule TAKE 1 CAPSULE (50,000 UNITS TOTAL) BY MOUTH EVERY 7 (SEVEN) DAYS 12 capsule 2   Current Facility-Administered Medications  Medication Dose Route Frequency Provider Last Rate Last Admin   0.9 %  sodium chloride infusion  500 mL Intravenous Continuous Armbruster, Carlota Raspberry, MD         ROS:  See  HPI  Physical Exam:  Today's Vitals   06/16/22 1512  BP: (!) 146/70  Pulse: 70  Resp: 18  Temp: 97.8 F (36.6 C)  TempSrc: Temporal  SpO2: 99%  Weight: 228 lb (103.4 kg)  Height: 5' 11.5" (1.816 m)   Body mass index is 31.36 kg/m.   Incision:  healed nicely Extremities:   There is a palpable left radial pulse.   Motor and sensory are in tact.   There is a thrill/bruit present.  Access is  easily palpable   Dialysis Duplex on 06/16/2022: Findings:  +--------------------+----------+-----------------+--------+  AVF                PSV (cm/s)Flow Vol (mL/min)Comments  +--------------------+----------+-----------------+--------+  Native artery inflow   263           2153                 +--------------------+----------+-----------------+--------+  AVF Anastomosis        578                                +--------------------+----------+-----------------+--------+  +------------+----------+-------------+----------+-------------------------  ----+  OUTFLOW VEINPSV (cm/s)Diameter (cm)Depth (cm)          Describe        +------------+----------+-------------+----------+-------------------------  Mid UA         241        0.58        1.00                             +------------+----------+-------------+----------+-------------------------  Dist UA        419        0.63        0.59   competing branch and 0.30 cm, 101 cm/s        +------------+----------+-------------+----------+-------------------------  AC Fossa       633        0.41        0.63                             --------------------------------------------------------------------------------   Assessment/Plan:  This is a 62 y.o. male who is s/p: 1st stage left BVT on 03/26/2022 by Dr. Trula Slade.   He has hx of left RC AVF on 12/25/2021 also by Dr. Trula Slade.   -the pt does not have evidence of steal. -pt's fistula has matured since his last visit and is ready for transposition.  Discussed having 2-3 skip incisions and possible nerve irritation after surgery.  We will get this scheduled with Dr. Trula Slade. -if pt has tunneled catheter, this can be removed at the discretion of the dialysis center once the pt's access has been successfully cannulated to their satisfaction.  -discussed with pt that access does not last forever and will need intervention or even new access at some point.     Leontine Locket, [facility] [phone]  Clinic MD:  Trula Slade

## 2022-06-18 ENCOUNTER — Other Ambulatory Visit: Payer: Self-pay

## 2022-06-18 DIAGNOSIS — N184 Chronic kidney disease, stage 4 (severe): Secondary | ICD-10-CM

## 2022-06-19 DIAGNOSIS — E103593 Type 1 diabetes mellitus with proliferative diabetic retinopathy without macular edema, bilateral: Secondary | ICD-10-CM | POA: Diagnosis not present

## 2022-06-19 DIAGNOSIS — H4051X3 Glaucoma secondary to other eye disorders, right eye, severe stage: Secondary | ICD-10-CM | POA: Diagnosis not present

## 2022-06-23 ENCOUNTER — Ambulatory Visit: Payer: BC Managed Care – PPO | Admitting: Endocrinology

## 2022-06-23 NOTE — Progress Notes (Deleted)
No show

## 2022-06-26 ENCOUNTER — Ambulatory Visit (HOSPITAL_COMMUNITY)
Admission: RE | Admit: 2022-06-26 | Discharge: 2022-06-26 | Disposition: A | Discharge status: Home / Self Care | Payer: BC Managed Care – PPO | Source: Ambulatory Visit | Attending: Nephrology | Admitting: Nephrology

## 2022-06-26 DIAGNOSIS — N184 Chronic kidney disease, stage 4 (severe): Secondary | ICD-10-CM | POA: Diagnosis not present

## 2022-06-26 LAB — RENAL FUNCTION PANEL
Albumin: 3.5 g/dL (ref 3.5–5.0)
Anion gap: 13 (ref 5–15)
BUN: 74 mg/dL — ABNORMAL HIGH (ref 8–23)
CO2: 19 mmol/L — ABNORMAL LOW (ref 22–32)
Calcium: 8.8 mg/dL — ABNORMAL LOW (ref 8.9–10.3)
Chloride: 107 mmol/L (ref 98–111)
Creatinine, Ser: 6.19 mg/dL — ABNORMAL HIGH (ref 0.61–1.24)
GFR, Estimated: 9 mL/min — ABNORMAL LOW (ref >60)
Glucose, Bld: 149 mg/dL — ABNORMAL HIGH (ref 70–99)
Phosphorus: 4.7 mg/dL — ABNORMAL HIGH (ref 2.5–4.6)
Potassium: 4.1 mmol/L (ref 3.5–5.1)
Sodium: 139 mmol/L (ref 135–145)

## 2022-06-26 LAB — IRON AND TIBC
Iron: 53 ug/dL (ref 45–182)
Saturation Ratios: 22 % (ref 17.9–39.5)
TIBC: 239 ug/dL — ABNORMAL LOW (ref 250–450)
UIBC: 186 ug/dL

## 2022-06-26 LAB — FERRITIN: Ferritin: 173 ng/mL (ref 24–336)

## 2022-06-26 MED ORDER — EPOETIN ALFA-EPBX 10000 UNIT/ML IJ SOLN
INTRAMUSCULAR | Status: AC
  Filled 2022-06-26: qty 2

## 2022-06-26 MED ORDER — EPOETIN ALFA-EPBX 10000 UNIT/ML IJ SOLN
20000.0000 [IU] | INTRAMUSCULAR | Status: DC
  Administered 2022-06-26: 20000 [IU] via SUBCUTANEOUS

## 2022-06-27 LAB — POCT HEMOGLOBIN-HEMACUE: Hemoglobin: 9.9 g/dL — ABNORMAL LOW (ref 13.0–17.0)

## 2022-06-27 LAB — PTH, INTACT AND CALCIUM
Calcium, Total (PTH): 8.5 mg/dL — ABNORMAL LOW (ref 8.6–10.2)
PTH: 195 pg/mL — ABNORMAL HIGH (ref 15–65)

## 2022-07-09 ENCOUNTER — Encounter (HOSPITAL_COMMUNITY): Payer: Self-pay | Admitting: Surgery

## 2022-07-10 ENCOUNTER — Telehealth: Payer: Self-pay

## 2022-07-10 NOTE — Progress Notes (Signed)
Spoke with patient on the phone. Instructed arrival time of 1145 as surgery time is 1414.  Patient is going to call Dr. Estanislado Spire scheduler [place] as he requested a 0630 arrival time.  He is flying in from [place] and will have to re-schedule surgery.

## 2022-07-10 NOTE — Telephone Encounter (Signed)
Patient call requesting to reschedule surgery for another date for an earlier time. Surgery date moved to 4/19, arriving to admitting at 0530 AM. Instructions reviewed and he verbalized understanding.

## 2022-07-14 ENCOUNTER — Telehealth: Payer: Self-pay

## 2022-07-14 NOTE — Telephone Encounter (Signed)
Pt called to reschedule missed appt.

## 2022-07-17 ENCOUNTER — Other Ambulatory Visit: Payer: Self-pay

## 2022-07-17 ENCOUNTER — Encounter (HOSPITAL_COMMUNITY): Payer: Self-pay | Admitting: Surgery

## 2022-07-17 NOTE — Anesthesia Preprocedure Evaluation (Addendum)
Anesthesia Evaluation  Patient identified by MRN, date of birth, ID band Patient awake    Reviewed: Allergy & Precautions, NPO status , Patient's Chart, lab work & pertinent test results  Airway Mallampati: II  TM Distance: >3 FB Neck ROM: Full    Dental  (+) Dental Advisory Given, Teeth Intact   Pulmonary neg pulmonary ROS   Pulmonary exam normal breath sounds clear to auscultation       Cardiovascular hypertension, Pt. on medications and Pt. on home beta blockers Normal cardiovascular exam Rhythm:Regular Rate:Normal     Neuro/Psych Blind    GI/Hepatic negative GI ROS, Neg liver ROS,,,  Endo/Other  diabetes    Renal/GU ESRFRenal disease     Musculoskeletal negative musculoskeletal ROS (+)    Abdominal   Peds  Hematology  (+) Blood dyscrasia, anemia , REFUSES BLOOD PRODUCTS  Anesthesia Other Findings CKD IV  Reproductive/Obstetrics                             Anesthesia Physical Anesthesia Plan  ASA: 3  Anesthesia Plan: General   Post-op Pain Management: Tylenol PO (pre-op)*   Induction: Intravenous  PONV Risk Score and Plan: 2 and Ondansetron, Dexamethasone, Propofol infusion, Treatment may vary due to age or medical condition and TIVA  Airway Management Planned: LMA  Additional Equipment:   Intra-op Plan:   Post-operative Plan: Extubation in OR  Informed Consent: I have reviewed the patients History and Physical, chart, labs and discussed the procedure including the risks, benefits and alternatives for the proposed anesthesia with the patient or authorized representative who has indicated his/her understanding and acceptance.     Dental advisory given  Plan Discussed with: CRNA  Anesthesia Plan Comments:        Anesthesia Quick Evaluation

## 2022-07-17 NOTE — Progress Notes (Signed)
Roberto [name] denies chest pain or shortness of breath. Patient denies having any s/s of Covid in his household, also denies any known exposure to Covid.  Dr. Cecilio Asper denies  any s/s of upper or lower respiratory in the past 8 weeks.   Dr. Cecilio Asper' PCP is Dr Lucianne Muss.  Dr. Cecilio Asper has type II diabetes, he is not on medication and does not check CBGs.  Patient reports that A1Cs run 4.7-5.5.

## 2022-07-18 ENCOUNTER — Other Ambulatory Visit: Payer: Self-pay

## 2022-07-18 ENCOUNTER — Encounter (HOSPITAL_COMMUNITY): Payer: Self-pay | Admitting: Surgery

## 2022-07-18 ENCOUNTER — Ambulatory Visit (HOSPITAL_COMMUNITY): Payer: BC Managed Care – PPO | Admitting: Emergency Medicine

## 2022-07-18 ENCOUNTER — Ambulatory Visit (HOSPITAL_COMMUNITY)
Admission: RE | Admit: 2022-07-18 | Discharge: 2022-07-18 | Disposition: A | Discharge status: Home / Self Care | Payer: BC Managed Care – PPO | Source: Ambulatory Visit | Attending: Surgery | Admitting: Surgery

## 2022-07-18 ENCOUNTER — Encounter (HOSPITAL_COMMUNITY)
Admission: RE | Disposition: A | Discharge status: Home / Self Care | Payer: Self-pay | Source: Ambulatory Visit | Attending: Surgery

## 2022-07-18 DIAGNOSIS — N186 End stage renal disease: Secondary | ICD-10-CM | POA: Diagnosis not present

## 2022-07-18 DIAGNOSIS — D631 Anemia in chronic kidney disease: Secondary | ICD-10-CM | POA: Diagnosis not present

## 2022-07-18 DIAGNOSIS — D649 Anemia, unspecified: Secondary | ICD-10-CM | POA: Diagnosis not present

## 2022-07-18 DIAGNOSIS — E1122 Type 2 diabetes mellitus with diabetic chronic kidney disease: Secondary | ICD-10-CM | POA: Diagnosis not present

## 2022-07-18 DIAGNOSIS — N185 Chronic kidney disease, stage 5: Secondary | ICD-10-CM | POA: Diagnosis not present

## 2022-07-18 DIAGNOSIS — I129 Hypertensive chronic kidney disease with stage 1 through stage 4 chronic kidney disease, or unspecified chronic kidney disease: Secondary | ICD-10-CM | POA: Diagnosis not present

## 2022-07-18 DIAGNOSIS — I120 Hypertensive chronic kidney disease with stage 5 chronic kidney disease or end stage renal disease: Secondary | ICD-10-CM | POA: Insufficient documentation

## 2022-07-18 DIAGNOSIS — N184 Chronic kidney disease, stage 4 (severe): Secondary | ICD-10-CM

## 2022-07-18 DIAGNOSIS — D759 Disease of blood and blood-forming organs, unspecified: Secondary | ICD-10-CM | POA: Diagnosis not present

## 2022-07-18 HISTORY — PX: BASCILIC VEIN TRANSPOSITION: SHX5742

## 2022-07-18 LAB — POCT I-STAT, CHEM 8
BUN: 87 mg/dL — ABNORMAL HIGH (ref 8–23)
Calcium, Ion: 1.14 mmol/L — ABNORMAL LOW (ref 1.15–1.40)
Chloride: 111 mmol/L (ref 98–111)
Creatinine, Ser: 7.3 mg/dL — ABNORMAL HIGH (ref 0.61–1.24)
Glucose, Bld: 105 mg/dL — ABNORMAL HIGH (ref 70–99)
HCT: 31 % — ABNORMAL LOW (ref 39.0–52.0)
Hemoglobin: 10.5 g/dL — ABNORMAL LOW (ref 13.0–17.0)
Potassium: 4.1 mmol/L (ref 3.5–5.1)
Sodium: 141 mmol/L (ref 135–145)
TCO2: 19 mmol/L — ABNORMAL LOW (ref 22–32)

## 2022-07-18 LAB — GLUCOSE, CAPILLARY
Glucose-Capillary: 104 mg/dL — ABNORMAL HIGH (ref 70–99)
Glucose-Capillary: 109 mg/dL — ABNORMAL HIGH (ref 70–99)

## 2022-07-18 LAB — NO BLOOD PRODUCTS

## 2022-07-18 SURGERY — TRANSPOSITION, VEIN, BASILIC
Anesthesia: Regional | Site: Arm Upper | Laterality: Left

## 2022-07-18 MED ORDER — FENTANYL CITRATE (PF) 250 MCG/5ML IJ SOLN
INTRAMUSCULAR | Status: AC
  Filled 2022-07-18: qty 5

## 2022-07-18 MED ORDER — BUPIVACAINE HCL (PF) 0.5 % IJ SOLN
INTRAMUSCULAR | Status: AC
  Filled 2022-07-18: qty 30

## 2022-07-18 MED ORDER — PHENYLEPHRINE HCL-NACL 20-0.9 MG/250ML-% IV SOLN
INTRAVENOUS | Status: DC | PRN
  Administered 2022-07-18: 160 ug via INTRAVENOUS
  Administered 2022-07-18: 20 ug/min via INTRAVENOUS

## 2022-07-18 MED ORDER — FENTANYL CITRATE (PF) 100 MCG/2ML IJ SOLN
25.0000 ug | INTRAMUSCULAR | Status: DC | PRN
  Administered 2022-07-18: 50 ug via INTRAVENOUS

## 2022-07-18 MED ORDER — GLYCOPYRROLATE PF 0.2 MG/ML IJ SOSY
PREFILLED_SYRINGE | INTRAMUSCULAR | Status: AC
  Filled 2022-07-18: qty 1

## 2022-07-18 MED ORDER — SURGIFLO WITH THROMBIN (HEMOSTATIC MATRIX KIT) OPTIME
TOPICAL | Status: DC | PRN
  Administered 2022-07-18: 1 via TOPICAL

## 2022-07-18 MED ORDER — 0.9 % SODIUM CHLORIDE (POUR BTL) OPTIME
TOPICAL | Status: DC | PRN
  Administered 2022-07-18: 1000 mL

## 2022-07-18 MED ORDER — MIDAZOLAM HCL 2 MG/2ML IJ SOLN
INTRAMUSCULAR | Status: AC
  Filled 2022-07-18: qty 2

## 2022-07-18 MED ORDER — CEFAZOLIN SODIUM-DEXTROSE 2-4 GM/100ML-% IV SOLN
2.0000 g | INTRAVENOUS | Status: AC
  Administered 2022-07-18: 2 g via INTRAVENOUS
  Filled 2022-07-18: qty 100

## 2022-07-18 MED ORDER — AMISULPRIDE (ANTIEMETIC) 5 MG/2ML IV SOLN: 10.0000 mg | Freq: Once | INTRAVENOUS | Status: DC | PRN

## 2022-07-18 MED ORDER — CHLORHEXIDINE GLUCONATE 0.12 % MT SOLN
15.0000 mL | Freq: Once | OROMUCOSAL | Status: AC
  Administered 2022-07-18: 15 mL via OROMUCOSAL
  Filled 2022-07-18: qty 15

## 2022-07-18 MED ORDER — EPHEDRINE 5 MG/ML INJ
INTRAVENOUS | Status: AC
  Filled 2022-07-18: qty 10

## 2022-07-18 MED ORDER — HEPARIN 6000 UNIT IRRIGATION SOLUTION
Status: DC | PRN
  Administered 2022-07-18: 1

## 2022-07-18 MED ORDER — FENTANYL CITRATE (PF) 100 MCG/2ML IJ SOLN
INTRAMUSCULAR | Status: AC
  Filled 2022-07-18: qty 2

## 2022-07-18 MED ORDER — EPHEDRINE SULFATE-NACL 50-0.9 MG/10ML-% IV SOSY
PREFILLED_SYRINGE | INTRAVENOUS | Status: DC | PRN
  Administered 2022-07-18: 10 mg via INTRAVENOUS
  Administered 2022-07-18: 20 mg via INTRAVENOUS
  Administered 2022-07-18: 10 mg via INTRAVENOUS

## 2022-07-18 MED ORDER — SODIUM CHLORIDE 0.9 % IV SOLN: INTRAVENOUS | Status: DC

## 2022-07-18 MED ORDER — DEXAMETHASONE SODIUM PHOSPHATE 10 MG/ML IJ SOLN
INTRAMUSCULAR | Status: AC
  Filled 2022-07-18: qty 1

## 2022-07-18 MED ORDER — CHLORHEXIDINE GLUCONATE 4 % EX LIQD: 60.0000 mL | Freq: Once | CUTANEOUS | Status: DC

## 2022-07-18 MED ORDER — LIDOCAINE 2% (20 MG/ML) 5 ML SYRINGE
INTRAMUSCULAR | Status: AC
  Filled 2022-07-18: qty 5

## 2022-07-18 MED ORDER — ORAL CARE MOUTH RINSE: 15.0000 mL | Freq: Once | OROMUCOSAL | Status: AC

## 2022-07-18 MED ORDER — ACETAMINOPHEN 500 MG PO TABS
1000.0000 mg | ORAL_TABLET | Freq: Once | ORAL | Status: AC
  Administered 2022-07-18: 1000 mg via ORAL
  Filled 2022-07-18: qty 2

## 2022-07-18 MED ORDER — PHENYLEPHRINE 80 MCG/ML (10ML) SYRINGE FOR IV PUSH (FOR BLOOD PRESSURE SUPPORT)
PREFILLED_SYRINGE | INTRAVENOUS | Status: AC
  Filled 2022-07-18: qty 10

## 2022-07-18 MED ORDER — BUPIVACAINE LIPOSOME 1.3 % IJ SUSP
INTRAMUSCULAR | Status: AC
  Filled 2022-07-18: qty 20

## 2022-07-18 MED ORDER — LIDOCAINE-EPINEPHRINE (PF) 1 %-1:200000 IJ SOLN
INTRAMUSCULAR | Status: AC
  Filled 2022-07-18: qty 30

## 2022-07-18 MED ORDER — FENTANYL CITRATE (PF) 250 MCG/5ML IJ SOLN
INTRAMUSCULAR | Status: DC | PRN
  Administered 2022-07-18 (×5): 25 ug via INTRAVENOUS

## 2022-07-18 MED ORDER — LIDOCAINE 2% (20 MG/ML) 5 ML SYRINGE
INTRAMUSCULAR | Status: DC | PRN
  Administered 2022-07-18: 100 mg via INTRAVENOUS

## 2022-07-18 MED ORDER — HEPARIN 6000 UNIT IRRIGATION SOLUTION
Status: AC
  Filled 2022-07-18: qty 500

## 2022-07-18 MED ORDER — DEXAMETHASONE SODIUM PHOSPHATE 10 MG/ML IJ SOLN
INTRAMUSCULAR | Status: DC | PRN
  Administered 2022-07-18: 5 mg via INTRAVENOUS

## 2022-07-18 MED ORDER — OXYCODONE-ACETAMINOPHEN 5-325 MG PO TABS: 1.0000 | ORAL_TABLET | Freq: Four times a day (QID) | ORAL | 0 refills | Status: DC | PRN

## 2022-07-18 MED ORDER — ONDANSETRON HCL 4 MG/2ML IJ SOLN
INTRAMUSCULAR | Status: AC
  Filled 2022-07-18: qty 2

## 2022-07-18 MED ORDER — PROPOFOL 10 MG/ML IV BOLUS
INTRAVENOUS | Status: DC | PRN
  Administered 2022-07-18: 200 mg via INTRAVENOUS

## 2022-07-18 MED ORDER — ONDANSETRON HCL 4 MG/2ML IJ SOLN
INTRAMUSCULAR | Status: DC | PRN
  Administered 2022-07-18: 4 mg via INTRAVENOUS

## 2022-07-18 SURGICAL SUPPLY — 50 items
ADH SKN CLS APL DERMABOND .7 (GAUZE/BANDAGES/DRESSINGS) ×1
AGENT HMST KT MTR STRL THRMB (HEMOSTASIS) ×1
ARMBAND PINK RESTRICT EXTREMIT (MISCELLANEOUS) ×1 IMPLANT
BAG COUNTER SPONGE SURGICOUNT (BAG) IMPLANT
BAG SPNG CNTER NS LX DISP (BAG)
BNDG CMPR 5X4 KNIT ELC UNQ LF (GAUZE/BANDAGES/DRESSINGS) ×1
BNDG CMPR 5X62 HK CLSR LF (GAUZE/BANDAGES/DRESSINGS) ×1
BNDG ELASTIC 4INX 5YD STR LF (GAUZE/BANDAGES/DRESSINGS) IMPLANT
BNDG ELASTIC 6INX 5YD STR LF (GAUZE/BANDAGES/DRESSINGS) IMPLANT
BNDG GAUZE DERMACEA FLUFF 4 (GAUZE/BANDAGES/DRESSINGS) IMPLANT
BNDG GZE DERMACEA 4 6PLY (GAUZE/BANDAGES/DRESSINGS) ×1
CANISTER SUCT 3000ML PPV (MISCELLANEOUS) ×1 IMPLANT
CLIP TI MEDIUM 24 (CLIP) IMPLANT
CLIP TI MEDIUM 6 (CLIP) IMPLANT
CLIP TI WIDE RED SMALL 24 (CLIP) IMPLANT
CLIP TI WIDE RED SMALL 6 (CLIP) IMPLANT
CNTNR URN SCR LID CUP LEK RST (MISCELLANEOUS) ×1 IMPLANT
CONT SPEC 4OZ STRL OR WHT (MISCELLANEOUS)
COVER PROBE W GEL 5X96 (DRAPES) ×1 IMPLANT
DERMABOND ADVANCED .7 DNX12 (GAUZE/BANDAGES/DRESSINGS) ×1 IMPLANT
ELECT REM PT RETURN 9FT ADLT (ELECTROSURGICAL) ×1
ELECTRODE REM PT RTRN 9FT ADLT (ELECTROSURGICAL) ×1 IMPLANT
GLOVE SURG SS PI 7.5 STRL IVOR (GLOVE) ×1 IMPLANT
GOWN STRL REUS W/ TWL LRG LVL3 (GOWN DISPOSABLE) ×2 IMPLANT
GOWN STRL REUS W/ TWL XL LVL3 (GOWN DISPOSABLE) ×1 IMPLANT
GOWN STRL REUS W/TWL LRG LVL3 (GOWN DISPOSABLE) ×2
GOWN STRL REUS W/TWL XL LVL3 (GOWN DISPOSABLE) ×1
HEMOSTAT SNOW SURGICEL 2X4 (HEMOSTASIS) IMPLANT
KIT BASIN OR (CUSTOM PROCEDURE TRAY) ×1 IMPLANT
KIT TURNOVER KIT B (KITS) ×1 IMPLANT
NDL 18GX1X1/2 (RX/OR ONLY) (NEEDLE) ×1 IMPLANT
NEEDLE 18GX1X1/2 (RX/OR ONLY) (NEEDLE) IMPLANT
NS IRRIG 1000ML POUR BTL (IV SOLUTION) ×1 IMPLANT
PACK CV ACCESS (CUSTOM PROCEDURE TRAY) ×1 IMPLANT
PAD ARMBOARD 7.5X6 YLW CONV (MISCELLANEOUS) ×2 IMPLANT
SLING ARM FOAM STRAP LRG (SOFTGOODS) IMPLANT
SLING ARM FOAM STRAP MED (SOFTGOODS) IMPLANT
SPONGE T-LAP 18X18 ~~LOC~~+RFID (SPONGE) IMPLANT
SURGIFLO W/THROMBIN 8M KIT (HEMOSTASIS) IMPLANT
SUT PROLENE 6 0 BV (SUTURE) ×1 IMPLANT
SUT SILK 2 0 SH (SUTURE) IMPLANT
SUT SILK 3 0 (SUTURE) ×1
SUT SILK 3-0 18XBRD TIE 12 (SUTURE) IMPLANT
SUT VIC AB 3-0 SH 27 (SUTURE) ×2
SUT VIC AB 3-0 SH 27X BRD (SUTURE) ×1 IMPLANT
SUT VICRYL 4-0 PS2 18IN ABS (SUTURE) ×1 IMPLANT
SYR 30ML LL (SYRINGE) ×1 IMPLANT
TOWEL GREEN STERILE (TOWEL DISPOSABLE) ×1 IMPLANT
UNDERPAD 30X36 HEAVY ABSORB (UNDERPADS AND DIAPERS) ×1 IMPLANT
WATER STERILE IRR 1000ML POUR (IV SOLUTION) ×1 IMPLANT

## 2022-07-18 NOTE — H&P (Signed)
POST OPERATIVE OFFICE NOTE    CC:  F/u for surgery  HPI:  This is a 62 y.o. male who is s/p 1st stage left BVT on 03/26/2022 by Dr. Conrad Zajkowski.   He has hx of left RC AVF on 12/25/2021 also by Dr. Kahlee Metivier. At his post op visit, this had occluded.    He was seen 05/05/2022 and his fistula was maturing, but there was an area of stenosis with PSV of 724 cm/s.  Given he was not yet on HD, conservative treatment was continued.  He was scheduled for follow up duplex.   Pt states he does not have pain/numbness in the left hand.  He states he did push ups to help his fistula mature.  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   The pt is not yet on dialysis.  He is undergoing testing for transplant.    Allergies  Allergen Reactions   Contrast Media [Iodinated Contrast Media] Rash    Current Outpatient Medications  Medication Sig Dispense Refill   acetaminophen (TYLENOL) 500 MG tablet Take 500-1,000 mg by mouth every 4 (four) hours as needed for moderate pain.     aspirin EC 81 MG tablet Take 81 mg by mouth daily.     atropine 1 % ophthalmic solution Place 1 drop into the right eye daily.     brimonidine (ALPHAGAN) 0.2 % ophthalmic solution Place 1 drop into both eyes in the morning and at bedtime.     cabergoline (DOSTINEX) 0.5 MG tablet Take 0.5-1 tablets (0.25-0.5 mg total) by mouth See admin instructions. Take one tablet (0.5 mg) by mouth on Tuesday's and one half tablet (0.25 mg) on Friday's 12 tablet 2   calcitRIOL (ROCALTROL) 0.25 MCG capsule Take 0.25 mcg by mouth daily.     cloNIDine (CATAPRES - DOSED IN MG/24 HR) 0.3 mg/24hr patch Place 0.3 mg onto the skin once a week. Changes on Friday.     cloNIDine (CATAPRES) 0.1 MG tablet Take 0.1 mg by mouth 3 (three) times daily as needed (Breakthrough hypertension).     dorzolamide-timolol (COSOPT) 2-0.5 % ophthalmic solution Place 1 drop into both eyes 2 (two) times daily.      hydrALAZINE (APRESOLINE) 25 MG tablet Take 25 mg by mouth 3 (three) times daily.     isosorbide mononitrate (IMDUR) 60 MG 24 hr tablet Take 60 mg by mouth daily. AM     Multiple Vitamins-Minerals (MULTIVITAL PO) Take 1 tablet by mouth daily.     nebivolol (BYSTOLIC) 5 MG tablet Take 5 mg by mouth at bedtime.     NIFEdipine (ADALAT CC) 30 MG 24 hr tablet Take 30 mg by mouth daily. AM     oxyCODONE-acetaminophen (PERCOCET) 5-325 MG tablet Take 1 tablet by mouth every 6 (six) hours as needed for severe pain. 8 tablet 0   timolol (BETIMOL) 0.25 % ophthalmic solution Place 1-2 drops into both eyes 2 (two) times daily.     VITAMIN A PO Take 1 tablet by mouth daily.     Vitamin D, Ergocalciferol, (DRISDOL) 1.25 MG (50000 UNIT) CAPS capsule TAKE 1 CAPSULE (50,000 UNITS TOTAL) BY MOUTH EVERY 7 (SEVEN) DAYS 12 capsule 2   Current Facility-Administered Medications  Medication Dose Route Frequency Provider Last Rate Last Admin   0.9 %  sodium chloride infusion  500 mL Intravenous Continuous Armbruster, Steven P, MD         ROS:  See   HPI  Physical Exam:  Today's Vitals   06/16/22 1512  BP: (!) 146/70  Pulse: 70  Resp: 18  Temp: 97.8 F (36.6 C)  TempSrc: Temporal  SpO2: 99%  Weight: 228 lb (103.4 kg)  Height: 5' 11.5" (1.816 m)   Body mass index is 31.36 kg/m.   Incision:  healed nicely Extremities:   There is a palpable left radial pulse.   Motor and sensory are in tact.   There is a thrill/bruit present.  Access is  easily palpable   Dialysis Duplex on 06/16/2022: Findings:  +--------------------+----------+-----------------+--------+  AVF                PSV (cm/s)Flow Vol (mL/min)Comments  +--------------------+----------+-----------------+--------+  Native artery inflow   263           2153                 +--------------------+----------+-----------------+--------+  AVF Anastomosis        578                                +--------------------+----------+-----------------+--------+  +------------+----------+-------------+----------+-------------------------  ----+  OUTFLOW VEINPSV (cm/s)Diameter (cm)Depth (cm)          Describe        +------------+----------+-------------+----------+-------------------------  Mid UA         241        0.58        1.00                             +------------+----------+-------------+----------+-------------------------  Dist UA        419        0.63        0.59   competing branch and 0.30 cm, 101 cm/s        +------------+----------+-------------+----------+-------------------------  AC Fossa       633        0.41        0.63                             --------------------------------------------------------------------------------   Assessment/Plan:  This is a 62 y.o. male who is s/p: 1st stage left BVT on 03/26/2022 by Dr. Naydeline Morace.   He has hx of left RC AVF on 12/25/2021 also by Dr. Huntleigh Doolen.   -the pt does not have evidence of steal. -pt's fistula has matured since his last visit and is ready for transposition.  Discussed having 2-3 skip incisions and possible nerve irritation after surgery.  We will get this scheduled with Dr. Ayva Veilleux. -if pt has tunneled catheter, this can be removed at the discretion of the dialysis center once the pt's access has been successfully cannulated to their satisfaction.  -discussed with pt that access does not last forever and will need intervention or even new access at some point.     Samantha Rhyne, PAC [facility] [phone]  Clinic MD:  Kalese Ensz 

## 2022-07-18 NOTE — Op Note (Signed)
    Patient name: Roberto Bilyk MD MRN: [id number] DOB: Sep 08, 1960 Sex: male  07/18/2022 Pre-operative Diagnosis: CKD Post-operative diagnosis:  Same Surgeon:  Durene Cal Assistants:  Aggie Moats, PA Procedure:   2nd stage left basilic vein transposition (revision, elevation, banch ligation) Anesthesia:  General Blood Loss:  minimal Specimens:  none  Findings:  excellent vein  Indications:  62 year old male who is here today for second stage basilic vein fistula creation  Procedure:  The patient was identified in the holding area and taken to [facility] OR ROOM 16  The patient was then placed supine on the table. general anesthesia was administered.  The patient was prepped and draped in the usual sterile fashion.  A time out was called and antibiotics were administered.  A PA was necessary explain the procedure and assist with technical details.  He helped with exposure by providing suction and retraction.  He helped with the anastomosis by following the suture as well as wound closure.  Ultrasound was used to evaluate the basilic vein in the upper arm.  It dilated nicely.  2 longitudinal incisions were made in the upper left arm.  Through these incisions the basilic vein was circumferentially mobilized.  The nerve was protected.  There were multiple large branches which were ligated between silk ties.  The vein was then marked for orientation.  It was occluded at the axilla and at the antecubital and divided near the antecubital crease.  A subcutaneous tunnel was then created with a curved Gore tunneler.  The vein was brought through the tunnel making sure to maintain proper orientation.  Next a end to end anastomosis was performed with running 6-0 Prolene.  Prior to completion, the appropriate flushing maneuvers were performed and the anastomosis was completed.  The clamps were released.  There was an excellent thrill within the fistula.  The wound was irrigated.  The subcutaneous tissue was closed  with 3-0 Vicryl and the skin was closed with 4-0 Vicryl.  Dermabond was applied.  There were no immediate complications   Disposition: To PACU stable.   Juleen China, M.D., [facility] [place] Office: [phone] Pager:  [phone]

## 2022-07-18 NOTE — Transfer of Care (Signed)
Immediate Anesthesia Transfer of Care Note  Patient: Roberto Abbott MD  Procedure(s) Performed: LEFT SECOND STAGE BASILIC VEIN TRANSPOSITION (Left: Arm Upper)  Patient Location: PACU  Anesthesia Type:General  Level of Consciousness: drowsy  Airway & Oxygen Therapy: Patient Spontanous Breathing and Patient connected to face mask oxygen  Post-op Assessment: Report given to RN and Post -op Vital signs reviewed and stable  Post vital signs: Reviewed and stable  Last Vitals:  Vitals Value Taken Time  BP 197/84 07/18/22 1001  Temp    Pulse 70 07/18/22 1006  Resp 11 07/18/22 1006  SpO2 95 % 07/18/22 1006  Vitals shown include unvalidated device data.  Last Pain:  Vitals:   07/18/22 1000  TempSrc:   PainSc: Asleep         Complications: No notable events documented.

## 2022-07-18 NOTE — Discharge Instructions (Signed)
° °  [facility] [place] ° °Discharge Instructions ° °AV Fistula or Graft Surgery for Dialysis Access ° °Please refer to the following instructions for your post-procedure care. Your surgeon or physician assistant will discuss any changes with you. ° °Activity ° °You may drive the day following your surgery, if you are comfortable and no longer taking prescription pain medication. Resume full activity as the soreness in your incision resolves. ° °Bathing/Showering ° °You may shower after you go home. Keep your incision dry for 48 hours. Do not soak in a bathtub, hot tub, or swim until the incision heals completely. You may not shower if you have a hemodialysis catheter. ° °Incision Care ° °Clean your incision with mild soap and water after 48 hours. Pat the area dry with a clean towel. You do not need a bandage unless otherwise instructed. Do not apply any ointments or creams to your incision. You may have skin glue on your incision. Do not peel it off. It will come off on its own in about one week. Your arm may swell a bit after surgery. To reduce swelling use pillows to elevate your arm so it is above your heart. Your doctor will tell you if you need to lightly wrap your arm with an ACE bandage. ° °Diet ° °Resume your normal diet. There are not special food restrictions following this procedure. In order to heal from your surgery, it is CRITICAL to get adequate nutrition. Your body requires vitamins, minerals, and protein. Vegetables are the best source of vitamins and minerals. Vegetables also provide the perfect balance of protein. Processed food has little nutritional value, so try to avoid this. ° °Medications ° °Resume taking all of your medications. If your incision is causing pain, you may take over-the counter pain relievers such as acetaminophen (Tylenol). If you were prescribed a stronger pain medication, please be aware these medications can cause nausea and constipation. Prevent  nausea by taking the medication with a snack or meal. Avoid constipation by drinking plenty of fluids and eating foods with high amount of fiber, such as fruits, vegetables, and grains. Do not take Tylenol if you are taking prescription pain medications. ° ° ° ° °Follow up °Your surgeon may want to see you in the office following your access surgery. If so, this will be arranged at the time of your surgery. ° °Please call us immediately for any of the following conditions: ° °Increased pain, redness, drainage (pus) from your incision site °Fever of 101 degrees or higher °Severe or worsening pain at your incision site °Hand pain or numbness. ° °Reduce your risk of vascular disease: ° °Stop smoking. If you would like help, call [facility] at 1-800-QUIT-NOW (1-800-784-8669) or [facility] at 336-586-4000 ° °Manage your cholesterol °Maintain a desired weight °Control your diabetes °Keep your blood pressure down ° °Dialysis ° °It will take several weeks to several months for your new dialysis access to be ready for use. Your surgeon will determine when it is OK to use it. Your nephrologist will continue to direct your dialysis. You can continue to use your Permcath until your new access is ready for use. ° °If you have any questions, please call the office at [phone]. ° °

## 2022-07-18 NOTE — Anesthesia Procedure Notes (Signed)
Procedure Name: LMA Insertion Date/Time: 07/18/2022 7:57 AM  Performed by: Loleta Sandi Towe, CRNAPre-anesthesia Checklist: Patient identified, Patient being monitored, Timeout performed, Emergency Drugs available and Suction available Patient Re-evaluated:Patient Re-evaluated prior to induction Oxygen Delivery Method: Circle system utilized Preoxygenation: Pre-oxygenation with 100% oxygen Induction Type: IV induction Ventilation: Mask ventilation without difficulty LMA: LMA inserted LMA Size: 5.0 Tube type: Oral Number of attempts: 1 Placement Confirmation: positive ETCO2 and breath sounds checked- equal and bilateral Tube secured with: Tape Dental Injury: Teeth and Oropharynx as per pre-operative assessment

## 2022-07-21 ENCOUNTER — Encounter (HOSPITAL_COMMUNITY): Payer: Self-pay | Admitting: Surgery

## 2022-07-22 NOTE — Anesthesia Postprocedure Evaluation (Signed)
Anesthesia Post Note  Patient: Roberto Abbott MD  Procedure(s) Performed: LEFT SECOND STAGE BASILIC VEIN TRANSPOSITION (Left: Arm Upper)     Patient location during evaluation: PACU Anesthesia Type: General Level of consciousness: awake and alert Pain management: pain level controlled Vital Signs Assessment: post-procedure vital signs reviewed and stable Respiratory status: spontaneous breathing Cardiovascular status: stable Anesthetic complications: no   No notable events documented.  Last Vitals:  Vitals:   07/18/22 1115 07/18/22 1130  BP: (!) 148/93 (!) 179/82  Pulse: 70 75  Resp: 10 20  Temp:  36.7 C  SpO2: 94% 95%    Last Pain:  Vitals:   07/18/22 1130  TempSrc:   PainSc: 0-No pain                 Lewie Loron

## 2022-07-24 ENCOUNTER — Ambulatory Visit (HOSPITAL_COMMUNITY)
Admission: RE | Admit: 2022-07-24 | Discharge: 2022-07-24 | Disposition: A | Discharge status: Home / Self Care | Payer: BC Managed Care – PPO | Source: Ambulatory Visit | Attending: Nephrology | Admitting: Nephrology

## 2022-07-24 VITALS — BP 179/76 | HR 69 | Temp 97.1°F | Resp 18

## 2022-07-24 DIAGNOSIS — N184 Chronic kidney disease, stage 4 (severe): Secondary | ICD-10-CM | POA: Diagnosis not present

## 2022-07-24 LAB — RENAL FUNCTION PANEL
Albumin: 3.3 g/dL — ABNORMAL LOW (ref 3.5–5.0)
Anion gap: 8 (ref 5–15)
BUN: 66 mg/dL — ABNORMAL HIGH (ref 8–23)
CO2: 21 mmol/L — ABNORMAL LOW (ref 22–32)
Calcium: 8.6 mg/dL — ABNORMAL LOW (ref 8.9–10.3)
Chloride: 111 mmol/L (ref 98–111)
Creatinine, Ser: 6.37 mg/dL — ABNORMAL HIGH (ref 0.61–1.24)
GFR, Estimated: 9 mL/min — ABNORMAL LOW (ref >60)
Glucose, Bld: 103 mg/dL — ABNORMAL HIGH (ref 70–99)
Phosphorus: 4.4 mg/dL (ref 2.5–4.6)
Potassium: 4.1 mmol/L (ref 3.5–5.1)
Sodium: 140 mmol/L (ref 135–145)

## 2022-07-24 LAB — IRON AND TIBC
Iron: 56 ug/dL (ref 45–182)
Saturation Ratios: 24 % (ref 17.9–39.5)
TIBC: 230 ug/dL — ABNORMAL LOW (ref 250–450)
UIBC: 174 ug/dL

## 2022-07-24 LAB — FERRITIN: Ferritin: 230 ng/mL (ref 24–336)

## 2022-07-24 LAB — POCT HEMOGLOBIN-HEMACUE: Hemoglobin: 9.3 g/dL — ABNORMAL LOW (ref 13.0–17.0)

## 2022-07-24 MED ORDER — EPOETIN ALFA-EPBX 10000 UNIT/ML IJ SOLN
20000.0000 [IU] | INTRAMUSCULAR | Status: DC
  Administered 2022-07-24: 20000 [IU] via SUBCUTANEOUS

## 2022-07-24 MED ORDER — EPOETIN ALFA-EPBX 10000 UNIT/ML IJ SOLN
INTRAMUSCULAR | Status: AC
  Filled 2022-07-24: qty 1

## 2022-07-28 DIAGNOSIS — H4053X4 Glaucoma secondary to other eye disorders, bilateral, indeterminate stage: Secondary | ICD-10-CM | POA: Diagnosis not present

## 2022-07-29 DIAGNOSIS — E1122 Type 2 diabetes mellitus with diabetic chronic kidney disease: Secondary | ICD-10-CM | POA: Diagnosis not present

## 2022-07-29 DIAGNOSIS — N2581 Secondary hyperparathyroidism of renal origin: Secondary | ICD-10-CM | POA: Diagnosis not present

## 2022-07-29 DIAGNOSIS — N185 Chronic kidney disease, stage 5: Secondary | ICD-10-CM | POA: Diagnosis not present

## 2022-07-29 DIAGNOSIS — I120 Hypertensive chronic kidney disease with stage 5 chronic kidney disease or end stage renal disease: Secondary | ICD-10-CM | POA: Diagnosis not present

## 2022-08-14 DIAGNOSIS — H4051X3 Glaucoma secondary to other eye disorders, right eye, severe stage: Secondary | ICD-10-CM | POA: Diagnosis not present

## 2022-08-14 DIAGNOSIS — E103593 Type 1 diabetes mellitus with proliferative diabetic retinopathy without macular edema, bilateral: Secondary | ICD-10-CM | POA: Diagnosis not present

## 2022-08-18 ENCOUNTER — Ambulatory Visit (INDEPENDENT_AMBULATORY_CARE_PROVIDER_SITE_OTHER): Payer: BC Managed Care – PPO | Admitting: Physician Assistant

## 2022-08-18 VITALS — BP 170/85 | HR 60 | Temp 97.3°F | Resp 18 | Ht 72.0 in | Wt 220.0 lb

## 2022-08-18 DIAGNOSIS — Z01818 Encounter for other preprocedural examination: Secondary | ICD-10-CM | POA: Diagnosis not present

## 2022-08-18 DIAGNOSIS — N184 Chronic kidney disease, stage 4 (severe): Secondary | ICD-10-CM

## 2022-08-18 NOTE — Progress Notes (Signed)
    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 Myra Gianotti on 07/18/22. The patient's wounds are well healed.  The patient notes no steal symptoms.  The patient is able to complete their activities of daily living. Her currently is not on hemodialysis. He is active on the transplant list. His Nephrologist is Dr. Marisue Humble.   Physical Examination   Vitals:   08/18/22 1049  BP: (!) 170/85  Pulse: 60  Resp: 18  Temp: (!) 97.3 F (36.3 C)  TempSrc: Temporal  SpO2: 98%  Weight: 220 lb (99.8 kg)  Height: 6' (1.829 m)   Body mass index is 29.84 kg/m.  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 Myra Gianotti on 07/18/22. Fistula is patent with good thrill. The patient's wounds are well healed. The patient notes no steal symptoms. He is not currently on HD.  The patient's access is ready for use immediately should he need to start dialysis  He can follow up with [place] as needed if he has any new or concerning symptoms regarding his Fistula    Roberto Congress, PA-C [facility] [place] Office: [phone]  Clinic MD: Myra Gianotti

## 2022-08-21 ENCOUNTER — Ambulatory Visit (HOSPITAL_COMMUNITY)
Admission: RE | Admit: 2022-08-21 | Discharge: 2022-08-21 | Disposition: A | Discharge status: Home / Self Care | Payer: BC Managed Care – PPO | Source: Ambulatory Visit | Attending: Nephrology | Admitting: Nephrology

## 2022-08-21 VITALS — BP 165/72 | HR 68 | Temp 97.8°F | Resp 18

## 2022-08-21 DIAGNOSIS — N184 Chronic kidney disease, stage 4 (severe): Secondary | ICD-10-CM | POA: Diagnosis not present

## 2022-08-21 LAB — RENAL FUNCTION PANEL
Albumin: 3.3 g/dL — ABNORMAL LOW (ref 3.5–5.0)
Anion gap: 11 (ref 5–15)
BUN: 64 mg/dL — ABNORMAL HIGH (ref 8–23)
CO2: 18 mmol/L — ABNORMAL LOW (ref 22–32)
Calcium: 8.5 mg/dL — ABNORMAL LOW (ref 8.9–10.3)
Chloride: 109 mmol/L (ref 98–111)
Creatinine, Ser: 5.89 mg/dL — ABNORMAL HIGH (ref 0.61–1.24)
GFR, Estimated: 10 mL/min — ABNORMAL LOW (ref >60)
Glucose, Bld: 129 mg/dL — ABNORMAL HIGH (ref 70–99)
Phosphorus: 4.5 mg/dL (ref 2.5–4.6)
Potassium: 3.6 mmol/L (ref 3.5–5.1)
Sodium: 138 mmol/L (ref 135–145)

## 2022-08-21 LAB — FERRITIN: Ferritin: 192 ng/mL (ref 24–336)

## 2022-08-21 LAB — IRON AND TIBC
Iron: 44 ug/dL — ABNORMAL LOW (ref 45–182)
Saturation Ratios: 20 % (ref 17.9–39.5)
TIBC: 225 ug/dL — ABNORMAL LOW (ref 250–450)
UIBC: 181 ug/dL

## 2022-08-21 LAB — POCT HEMOGLOBIN-HEMACUE: Hemoglobin: 9.7 g/dL — ABNORMAL LOW (ref 13.0–17.0)

## 2022-08-21 MED ORDER — EPOETIN ALFA-EPBX 10000 UNIT/ML IJ SOLN
INTRAMUSCULAR | Status: AC
  Filled 2022-08-21: qty 2

## 2022-08-21 MED ORDER — EPOETIN ALFA-EPBX 10000 UNIT/ML IJ SOLN
20000.0000 [IU] | INTRAMUSCULAR | Status: DC
  Administered 2022-08-21: 20000 [IU] via SUBCUTANEOUS

## 2022-09-02 ENCOUNTER — Ambulatory Visit: Payer: BC Managed Care – PPO | Admitting: Endocrinology

## 2022-09-02 ENCOUNTER — Encounter: Payer: Self-pay | Admitting: Endocrinology

## 2022-09-02 VITALS — BP 132/80 | HR 64 | Ht 72.0 in | Wt 229.0 lb

## 2022-09-02 DIAGNOSIS — E1169 Type 2 diabetes mellitus with other specified complication: Secondary | ICD-10-CM

## 2022-09-02 DIAGNOSIS — D352 Benign neoplasm of pituitary gland: Secondary | ICD-10-CM | POA: Diagnosis not present

## 2022-09-02 DIAGNOSIS — E669 Obesity, unspecified: Secondary | ICD-10-CM

## 2022-09-02 LAB — TESTOSTERONE: Testosterone: 214.71 ng/dL — ABNORMAL LOW (ref 300.00–890.00)

## 2022-09-02 LAB — T4, FREE: Free T4: 0.84 ng/dL (ref 0.60–1.60)

## 2022-09-02 LAB — GLUCOSE, POCT (MANUAL RESULT ENTRY): POC Glucose: 100 mg/dl — AB (ref 70–99)

## 2022-09-02 LAB — POCT GLYCOSYLATED HEMOGLOBIN (HGB A1C): Hemoglobin A1C: 5 % (ref 4.0–5.6)

## 2022-09-02 NOTE — Progress Notes (Signed)
Patient ID: Roberto Abbott MD, male   DOB: 1960/06/19, 62 y.o.   MRN: [id number]   Chief complaint: Follow-up of multiple endocrine problems  History of Present Illness:  PROBLEM 1: Pituitary tumor  His MRI on 07/18/14 showed a pituitary tumor with the following description: 2.3 x 2 x 1.8 cm mass centered in the sella with suprasellar extension and cavernous sinus extension greater on the left with an appearance most suggestive of pituitary macroadenoma. This impresses upon the optic chiasm  Had difficulty with peripheral vision on the left side but this resolved Visual difficulties recently have been unrelated to the tumor   MRI in 2/17 showing focal area of delayed enhancement this represents a pituitary adenoma on the left.  This lesion measures 15 x 12 x 11 mm.   Has had a follow-up MRI in 10/2019 which showed the following  . Decreased size of mass along the left aspect of the sella turcica. Residual soft tissue along the sellar floor measures approximately 11 x 5 mm. No mass effect on the optic chiasm.  PROBLEM 2:  Hyperprolactinemia:  At baseline he had a prolactin level checked and this was markedly increased at 1312 He had symptoms of decreased libido and gynecomastia Testosterone level at baseline was low at 70  He  was started on Dostinex 0.5 mg initially half tablet twice a week and then 1 tablet twice a week on his initial consultation on 08/04/14 He has been taking this regularly without any side effects like nausea   He is continuing to be taking half tablet of Dostinex alternating with 1 tablet a week on Mondays and Fridays  His prolactin was previously increasing at 19 before the dose increase and subsequently below 9 Recent labs pending  No complaints of headaches  Lab Results  Component Value Date   PROLACTIN 8.5 12/13/2021   PROLACTIN 6.3 04/12/2021   PROLACTIN 19.3 (H) 05/28/2020   PROLACTIN 5.0 09/29/2019   PROLACTIN 6.9 04/15/2019    . HYPOGONADISM:   This was transient after initial diagnosis of large prolactinoma and has not been on treatments previously  Overall feels good with his energy level His testosterone level has improved   Lab Results  Component Value Date   TESTOSTERONE 442 12/13/2021   THYROID function:  free T4 consistently normal  Lab Results  Component Value Date   TSH 0.404 07/18/2014   FREET4 1.17 12/13/2021   FREET4 0.96 04/12/2021   FREET4 1.16 04/15/2019     PROBLEM 3:  DIABETES type II with obesity    He was diagnosed  in 2014  and has been Previously managed by a primary care physician who he works with. On admission to the hospital in 2016 his glucose was 380 without any evidence of ketosis  Highest A1c had been 8%, subsequently in the normal range since 11/18  A1c is still normal at 5.0, he does have history of anemia  NON-insulin hypoglycemic drugs: None  Current management, blood sugar patterns and problems identified: He has still not required any pharmacological meds for his diabetes because of good blood sugars Did not bring his monitor for download and checking sporadically He is trying to watch his diet as before and limits portions and high-calorie foods and carbohydrates Previously has seen the dietitian He tries to exercise with resistance exercises or going up steps  No significant weight change  Fasting glucose 100 today in the office  Blood sugars checked at home have been: 105-100  Last  visit with dietitian: 01/2018  Weight history:    Wt Readings from Last 3 Encounters:  09/02/22 229 lb (103.9 kg)  08/18/22 220 lb (99.8 kg)  07/18/22 227 lb (103 kg)    Lab Results  Component Value Date   HGBA1C 5.0 12/13/2021   HGBA1C 5.3 04/12/2021   HGBA1C 5.2 05/28/2020   Lab Results  Component Value Date   MICROALBUR 90.6 (H) 10/10/2016   LDLCALC 50 12/13/2021   CREATININE 5.89 (H) 08/21/2022    OTHER active problems and review of  systems    Past Medical History:  Diagnosis Date   Anemia    Chronic kidney disease    Stage 4   Detached retina    Diabetes mellitus without complication (HCC)    pt states his A1C was has been in normal range for several years now.   H/O hypogonadism    History of kidney stones    passed   History of pituitary tumor    Hypertension    Prolactinoma, benign (HCC)    Vision loss, bilateral     Past Surgical History:  Procedure Laterality Date   AV FISTULA PLACEMENT Left 12/25/2021   Procedure: LEFT RADIAL CEPHALIC FISTULA CREATION;  Surgeon: Nada Libman, MD;  Location: [facility] OR;  Service: Vascular;  Laterality: Left;   AV FISTULA PLACEMENT Left 03/26/2022   Procedure: LEFT UPPER EXTREMITY BRACHIOCEPHALIC ARTERIOVENOUS (AV) FISTULA CREATION;  Surgeon: Nada Libman, MD;  Location: [facility] OR;  Service: Vascular;  Laterality: Left;   BASCILIC VEIN TRANSPOSITION Left 07/18/2022   Procedure: LEFT SECOND STAGE BASILIC VEIN TRANSPOSITION;  Surgeon: Nada Libman, MD;  Location: [facility] OR;  Service: Vascular;  Laterality: Left;   COLONOSCOPY     EYE SURGERY     retina surgery - 2018?   KIDNEY STONE SURGERY  1987    Family History  Problem Relation Age of Onset   Diabetes Mother    Colon polyps Mother    Heart disease Mother    Hypertension Mother    Colon cancer Father    Kidney disease Maternal Grandmother    Colon cancer Maternal Grandfather    Esophageal cancer Maternal Uncle    Colon cancer Maternal Uncle     Social History:  reports that he has never smoked. He has never been exposed to tobacco smoke. He has never used smokeless tobacco. He reports that he does not drink alcohol and does not use drugs.  Allergies:  Allergies  Allergen Reactions   Contrast Media [Iodinated Contrast Media] Rash    Allergies as of 09/02/2022       Reactions   Contrast Media [iodinated Contrast Media] Rash        Medication List        Accurate as of September 02, 2022 10:53 AM. If  you have any questions, ask your nurse or doctor.          acetaminophen 500 MG tablet Commonly known as: TYLENOL Take 500-1,000 mg by mouth every 4 (four) hours as needed for moderate pain.   aspirin EC 81 MG tablet Take 81 mg by mouth daily.   atropine 1 % ophthalmic solution Place 1 drop into the right eye daily.   brimonidine 0.2 % ophthalmic solution Commonly known as: ALPHAGAN Place 1 drop into both eyes in the morning and at bedtime.   cabergoline 0.5 MG tablet Commonly known as: DOSTINEX Take 0.5-1 tablets (0.25-0.5 mg total) by mouth See admin instructions. Take one tablet (0.5  mg) by mouth on Tuesday's and one half tablet (0.25 mg) on Friday's   calcitRIOL 0.25 MCG capsule Commonly known as: ROCALTROL Take 0.25 mcg by mouth daily.   cloNIDine 0.1 MG tablet Commonly known as: CATAPRES Take 0.1 mg by mouth 3 (three) times daily as needed (Breakthrough hypertension).   cloNIDine 0.3 mg/24hr patch Commonly known as: CATAPRES - Dosed in mg/24 hr Place 0.3 mg onto the skin once a week. Changes on Friday.   dorzolamide-timolol 2-0.5 % ophthalmic solution Commonly known as: COSOPT Place 1 drop into both eyes 2 (two) times daily.   hydrALAZINE 25 MG tablet Commonly known as: APRESOLINE Take 25 mg by mouth 3 (three) times daily.   hydrALAZINE 50 MG tablet Commonly known as: APRESOLINE Take 50 mg by mouth in the morning and at bedtime.   isosorbide mononitrate 60 MG 24 hr tablet Commonly known as: IMDUR Take 60 mg by mouth daily. AM   MULTIVITAL PO Take 1 tablet by mouth daily.   nebivolol 5 MG tablet Commonly known as: BYSTOLIC Take 5 mg by mouth at bedtime.   NIFEdipine 30 MG 24 hr tablet Commonly known as: ADALAT CC Take 30 mg by mouth daily. AM   oxyCODONE-acetaminophen 5-325 MG tablet Commonly known as: Percocet Take 1 tablet by mouth every 6 (six) hours as needed for severe pain.   timolol 0.25 % ophthalmic solution Commonly known as:  BETIMOL Place 1-2 drops into both eyes 2 (two) times daily.   VITAMIN A PO Take 1 tablet by mouth daily.   Vitamin D (Ergocalciferol) 1.25 MG (50000 UNIT) Caps capsule Commonly known as: DRISDOL TAKE 1 CAPSULE (50,000 UNITS TOTAL) BY MOUTH EVERY 7 (SEVEN) DAYS        LABS:  No visits with results within 1 Week(s) from this visit.  Latest known visit with results is:  Hospital Outpatient Visit on 08/21/2022  Component Date Value Ref Range Status   Sodium 08/21/2022 138  135 - 145 mmol/L Final   Potassium 08/21/2022 3.6  3.5 - 5.1 mmol/L Final   Chloride 08/21/2022 109  98 - 111 mmol/L Final   CO2 08/21/2022 18 (L)  22 - 32 mmol/L Final   Glucose, Bld 08/21/2022 129 (H)  70 - 99 mg/dL Final   Glucose reference range applies only to samples taken after fasting for at least 8 hours.   BUN 08/21/2022 64 (H)  8 - 23 mg/dL Final   Creatinine, Ser 08/21/2022 5.89 (H)  0.61 - 1.24 mg/dL Final   Calcium 13/24/4010 8.5 (L)  8.9 - 10.3 mg/dL Final   Phosphorus 27/25/3664 4.5  2.5 - 4.6 mg/dL Final   Albumin 40/34/7425 3.3 (L)  3.5 - 5.0 g/dL Final   GFR, Estimated 08/21/2022 10 (L)  >60 mL/min Final   Comment: (NOTE) Calculated using the CKD-EPI Creatinine Equation (2021)    Anion gap 08/21/2022 11  5 - 15 Final   Performed at [facility], [address], [place], [place] [address]   Iron 08/21/2022 44 (L)  45 - 182 ug/dL Final   TIBC 75/64/3329 225 (L)  250 - 450 ug/dL Final   Saturation Ratios 08/21/2022 20  17.9 - 39.5 % Final   UIBC 08/21/2022 181  ug/dL Final   Performed at [facility], [address], [place], [place] [address]   Ferritin 08/21/2022 192  24 - 336 ng/mL Final   Performed at [facility], [address], [place], [place] [address]  Hemoglobin 08/21/2022 9.7 (L)  13.0 - 17.0 g/dL Final     REVIEW OF SYSTEMS:        Eyes: He has multiple problems including vitreous hemorrhage, glaucoma with markedly decreased vision bilaterally  No  history of hyperlipidemia  Lab Results  Component Value Date   CHOL 96 (L) 12/13/2021   HDL 30 (L) 12/13/2021   LDLCALC 50 12/13/2021   TRIG 79 12/13/2021   CHOLHDL 3.2 12/13/2021     Hypertension: has had a long history of high blood pressure   Currently on regimen of amlodipine 10 mg, carvedilol and hydralazine 50  He is treated by nephrologist  Monitoring at home and blood pressure has been normal, mostly 130/80s systolic  BP Readings from Last 3 Encounters:  09/02/22 132/80  08/21/22 (!) 165/72  08/18/22 (!) 170/85    NEPHROPATHY:  Has had renal biopsy Is also waiting for renal transplant Has not been recommended dialysis as yet but does have a fistula  Followed by nephrologist with creatinine history as follows: Most recent creatinine:   Lab Results  Component Value Date   CREATININE 5.89 (H) 08/21/2022   CREATININE 6.37 (H) 07/24/2022   CREATININE 7.30 (H) 07/18/2022   CREATININE 6.19 (H) 06/26/2022    No symptoms of NEUROPATHY: Last foot exam 08/2018    VITAMIN D deficiency: This was done as a screening and at baseline was low at 5.8.   Has been on 3000U supplements   Lab Results  Component Value Date   VD25OH 42.95 09/29/2019   VD25OH 42.6 03/18/2019   VD25OH 12.24 (L) 09/22/2018     Lab Results  Component Value Date   CALCIUM 8.5 (L) 08/21/2022   PHOS 4.5 08/21/2022     PHYSICAL EXAM:  BP 132/80   Pulse 64   Ht 6' (1.829 m)   Wt 229 lb (103.9 kg)   SpO2 98%   BMI 31.06 kg/m      ASSESSMENT/PLAN:   DIABETES type II, non--insulin-requiring with mild obesity  See history of present illness for detailed discussion of current diabetes management, blood sugar patterns and problems identified  His A1c is normal without medications although likely affected by anemia Blood sugars are fairly good at home and he has maintained his weight loss, trying to be as active as possible and watching diet  Will continue to monitor on diet  alone   Macroprolactinoma of the pituitary gland with very high baseline prolactin level of 1315 This has been treated successfully with Dostinex and is taking 0.25 mg alternating with 0.5 mg a week with continued normal levels of prolactin Labs from today pending  MRI showed reduced tumor on the last exam   HYPOGONADISM:  This was present only at baseline when he had pituitary tumor diagnosis His testosterone level has been subsequently normal but will follow-up again  Also check free T4  Follow-up in 6 months   Genesys Coggeshall 09/02/2022, 10:53 AM   Note: This office note was prepared with Dragon voice recognition system technology. Any transcriptional errors that result from this process are unintentional.

## 2022-09-04 LAB — PROLACTIN: Prolactin: 16.8 ng/mL (ref 3.6–25.2)

## 2022-09-04 NOTE — Progress Notes (Signed)
Please call to let patient know that the prolactin level is 17, to continue same dose of cabergoline.  His testosterone level is lower than before but if he is not having any unusual fatigue would like to continue monitoring without treatment

## 2022-09-18 ENCOUNTER — Encounter (HOSPITAL_COMMUNITY): Payer: BC Managed Care – PPO

## 2022-09-18 DIAGNOSIS — Z01818 Encounter for other preprocedural examination: Secondary | ICD-10-CM | POA: Diagnosis not present

## 2022-09-25 ENCOUNTER — Ambulatory Visit (HOSPITAL_COMMUNITY)
Admission: RE | Admit: 2022-09-25 | Discharge: 2022-09-25 | Disposition: A | Discharge status: Home / Self Care | Payer: BC Managed Care – PPO | Source: Ambulatory Visit | Attending: Nephrology | Admitting: Nephrology

## 2022-09-25 VITALS — BP 180/85 | HR 63 | Temp 97.4°F

## 2022-09-25 DIAGNOSIS — N184 Chronic kidney disease, stage 4 (severe): Secondary | ICD-10-CM | POA: Insufficient documentation

## 2022-09-25 LAB — IRON AND TIBC
Iron: 56 ug/dL (ref 45–182)
Saturation Ratios: 24 % (ref 17.9–39.5)
TIBC: 230 ug/dL — ABNORMAL LOW (ref 250–450)
UIBC: 174 ug/dL

## 2022-09-25 LAB — FERRITIN: Ferritin: 186 ng/mL (ref 24–336)

## 2022-09-25 LAB — RENAL FUNCTION PANEL
Albumin: 3.4 g/dL — ABNORMAL LOW (ref 3.5–5.0)
Anion gap: 11 (ref 5–15)
BUN: 72 mg/dL — ABNORMAL HIGH (ref 8–23)
CO2: 17 mmol/L — ABNORMAL LOW (ref 22–32)
Calcium: 8.4 mg/dL — ABNORMAL LOW (ref 8.9–10.3)
Chloride: 110 mmol/L (ref 98–111)
Creatinine, Ser: 6.57 mg/dL — ABNORMAL HIGH (ref 0.61–1.24)
GFR, Estimated: 9 mL/min — ABNORMAL LOW (ref >60)
Glucose, Bld: 105 mg/dL — ABNORMAL HIGH (ref 70–99)
Phosphorus: 4.7 mg/dL — ABNORMAL HIGH (ref 2.5–4.6)
Potassium: 3.9 mmol/L (ref 3.5–5.1)
Sodium: 138 mmol/L (ref 135–145)

## 2022-09-25 LAB — POCT HEMOGLOBIN-HEMACUE: Hemoglobin: 8.8 g/dL — ABNORMAL LOW (ref 13.0–17.0)

## 2022-09-25 MED ORDER — EPOETIN ALFA-EPBX 10000 UNIT/ML IJ SOLN
20000.0000 [IU] | INTRAMUSCULAR | Status: DC
  Administered 2022-09-25: 20000 [IU] via SUBCUTANEOUS

## 2022-09-25 MED ORDER — EPOETIN ALFA-EPBX 10000 UNIT/ML IJ SOLN
INTRAMUSCULAR | Status: AC
  Filled 2022-09-25: qty 2

## 2022-09-27 LAB — PTH, INTACT AND CALCIUM
Calcium, Total (PTH): 8.3 mg/dL — ABNORMAL LOW (ref 8.6–10.2)
PTH: 326 pg/mL — ABNORMAL HIGH (ref 15–65)

## 2022-10-16 ENCOUNTER — Encounter (HOSPITAL_COMMUNITY): Payer: BC Managed Care – PPO

## 2022-10-17 DIAGNOSIS — Z01818 Encounter for other preprocedural examination: Secondary | ICD-10-CM | POA: Diagnosis not present

## 2022-10-23 ENCOUNTER — Ambulatory Visit (HOSPITAL_COMMUNITY)
Admission: RE | Admit: 2022-10-23 | Discharge: 2022-10-23 | Disposition: A | Discharge status: Home / Self Care | Payer: BC Managed Care – PPO | Source: Ambulatory Visit | Attending: Nephrology | Admitting: Nephrology

## 2022-10-23 VITALS — BP 179/80 | HR 62 | Temp 97.0°F | Resp 18

## 2022-10-23 DIAGNOSIS — N184 Chronic kidney disease, stage 4 (severe): Secondary | ICD-10-CM | POA: Diagnosis not present

## 2022-10-23 LAB — POCT HEMOGLOBIN-HEMACUE: Hemoglobin: 9.5 g/dL — ABNORMAL LOW (ref 13.0–17.0)

## 2022-10-23 LAB — RENAL FUNCTION PANEL
Albumin: 3.5 g/dL (ref 3.5–5.0)
Anion gap: 10 (ref 5–15)
BUN: 66 mg/dL — ABNORMAL HIGH (ref 8–23)
CO2: 18 mmol/L — ABNORMAL LOW (ref 22–32)
Calcium: 8.7 mg/dL — ABNORMAL LOW (ref 8.9–10.3)
Chloride: 110 mmol/L (ref 98–111)
Creatinine, Ser: 6.55 mg/dL — ABNORMAL HIGH (ref 0.61–1.24)
GFR, Estimated: 9 mL/min — ABNORMAL LOW (ref >60)
Glucose, Bld: 104 mg/dL — ABNORMAL HIGH (ref 70–99)
Phosphorus: 4.3 mg/dL (ref 2.5–4.6)
Potassium: 4 mmol/L (ref 3.5–5.1)
Sodium: 138 mmol/L (ref 135–145)

## 2022-10-23 LAB — IRON AND TIBC
Iron: 51 ug/dL (ref 45–182)
Saturation Ratios: 21 % (ref 17.9–39.5)
TIBC: 245 ug/dL — ABNORMAL LOW (ref 250–450)
UIBC: 194 ug/dL

## 2022-10-23 LAB — FERRITIN: Ferritin: 199 ng/mL (ref 24–336)

## 2022-10-23 MED ORDER — EPOETIN ALFA-EPBX 10000 UNIT/ML IJ SOLN
INTRAMUSCULAR | Status: AC
  Filled 2022-10-23: qty 2

## 2022-10-23 MED ORDER — EPOETIN ALFA-EPBX 10000 UNIT/ML IJ SOLN
20000.0000 [IU] | INTRAMUSCULAR | Status: DC
  Administered 2022-10-23: 20000 [IU] via SUBCUTANEOUS

## 2022-10-30 DIAGNOSIS — E103593 Type 1 diabetes mellitus with proliferative diabetic retinopathy without macular edema, bilateral: Secondary | ICD-10-CM | POA: Diagnosis not present

## 2022-10-30 DIAGNOSIS — H33002 Unspecified retinal detachment with retinal break, left eye: Secondary | ICD-10-CM | POA: Diagnosis not present

## 2022-10-30 DIAGNOSIS — H33001 Unspecified retinal detachment with retinal break, right eye: Secondary | ICD-10-CM | POA: Diagnosis not present

## 2022-10-30 DIAGNOSIS — H4051X3 Glaucoma secondary to other eye disorders, right eye, severe stage: Secondary | ICD-10-CM | POA: Diagnosis not present

## 2022-11-17 DIAGNOSIS — H4053X4 Glaucoma secondary to other eye disorders, bilateral, indeterminate stage: Secondary | ICD-10-CM | POA: Diagnosis not present

## 2022-11-20 ENCOUNTER — Ambulatory Visit (HOSPITAL_COMMUNITY)
Admission: RE | Admit: 2022-11-20 | Discharge: 2022-11-20 | Disposition: A | Discharge status: Home / Self Care | Payer: BC Managed Care – PPO | Source: Ambulatory Visit | Attending: Nephrology | Admitting: Nephrology

## 2022-11-20 VITALS — BP 167/80 | HR 57 | Temp 97.1°F | Resp 17

## 2022-11-20 DIAGNOSIS — N184 Chronic kidney disease, stage 4 (severe): Secondary | ICD-10-CM | POA: Diagnosis not present

## 2022-11-20 LAB — IRON AND TIBC
Iron: 61 ug/dL (ref 45–182)
Saturation Ratios: 27 % (ref 17.9–39.5)
TIBC: 230 ug/dL — ABNORMAL LOW (ref 250–450)
UIBC: 169 ug/dL

## 2022-11-20 LAB — RENAL FUNCTION PANEL
Albumin: 3.4 g/dL — ABNORMAL LOW (ref 3.5–5.0)
Anion gap: 9 (ref 5–15)
BUN: 61 mg/dL — ABNORMAL HIGH (ref 8–23)
CO2: 20 mmol/L — ABNORMAL LOW (ref 22–32)
Calcium: 8.5 mg/dL — ABNORMAL LOW (ref 8.9–10.3)
Chloride: 111 mmol/L (ref 98–111)
Creatinine, Ser: 6.49 mg/dL — ABNORMAL HIGH (ref 0.61–1.24)
GFR, Estimated: 9 mL/min — ABNORMAL LOW (ref >60)
Glucose, Bld: 102 mg/dL — ABNORMAL HIGH (ref 70–99)
Phosphorus: 4.7 mg/dL — ABNORMAL HIGH (ref 2.5–4.6)
Potassium: 4 mmol/L (ref 3.5–5.1)
Sodium: 140 mmol/L (ref 135–145)

## 2022-11-20 LAB — POCT HEMOGLOBIN-HEMACUE: Hemoglobin: 9.1 g/dL — ABNORMAL LOW (ref 13.0–17.0)

## 2022-11-20 LAB — FERRITIN: Ferritin: 179 ng/mL (ref 24–336)

## 2022-11-20 MED ORDER — EPOETIN ALFA-EPBX 10000 UNIT/ML IJ SOLN
INTRAMUSCULAR | Status: AC
  Filled 2022-11-20: qty 2

## 2022-11-20 MED ORDER — EPOETIN ALFA-EPBX 10000 UNIT/ML IJ SOLN
20000.0000 [IU] | INTRAMUSCULAR | Status: DC
  Administered 2022-11-20: 20000 [IU] via SUBCUTANEOUS

## 2022-11-21 DIAGNOSIS — Z01818 Encounter for other preprocedural examination: Secondary | ICD-10-CM | POA: Diagnosis not present

## 2022-11-28 DIAGNOSIS — E1122 Type 2 diabetes mellitus with diabetic chronic kidney disease: Secondary | ICD-10-CM | POA: Diagnosis not present

## 2022-11-28 DIAGNOSIS — I120 Hypertensive chronic kidney disease with stage 5 chronic kidney disease or end stage renal disease: Secondary | ICD-10-CM | POA: Diagnosis not present

## 2022-11-28 DIAGNOSIS — D631 Anemia in chronic kidney disease: Secondary | ICD-10-CM | POA: Diagnosis not present

## 2022-11-28 DIAGNOSIS — N185 Chronic kidney disease, stage 5: Secondary | ICD-10-CM | POA: Diagnosis not present

## 2022-12-16 ENCOUNTER — Other Ambulatory Visit (HOSPITAL_COMMUNITY): Payer: Self-pay | Admitting: *Deleted

## 2022-12-18 ENCOUNTER — Ambulatory Visit (HOSPITAL_COMMUNITY)
Admission: RE | Admit: 2022-12-18 | Discharge: 2022-12-18 | Disposition: A | Discharge status: Home / Self Care | Payer: BC Managed Care – PPO | Source: Ambulatory Visit | Attending: Nephrology | Admitting: Nephrology

## 2022-12-18 VITALS — BP 180/71 | HR 63 | Temp 98.2°F | Resp 17

## 2022-12-18 DIAGNOSIS — N184 Chronic kidney disease, stage 4 (severe): Secondary | ICD-10-CM | POA: Diagnosis not present

## 2022-12-18 LAB — IRON AND TIBC
Iron: 65 ug/dL (ref 45–182)
Saturation Ratios: 26 % (ref 17.9–39.5)
TIBC: 248 ug/dL — ABNORMAL LOW (ref 250–450)
UIBC: 183 ug/dL

## 2022-12-18 LAB — RENAL FUNCTION PANEL
Albumin: 3.6 g/dL (ref 3.5–5.0)
Anion gap: 12 (ref 5–15)
BUN: 84 mg/dL — ABNORMAL HIGH (ref 8–23)
CO2: 17 mmol/L — ABNORMAL LOW (ref 22–32)
Calcium: 8.7 mg/dL — ABNORMAL LOW (ref 8.9–10.3)
Chloride: 111 mmol/L (ref 98–111)
Creatinine, Ser: 7.08 mg/dL — ABNORMAL HIGH (ref 0.61–1.24)
GFR, Estimated: 8 mL/min — ABNORMAL LOW (ref >60)
Glucose, Bld: 98 mg/dL (ref 70–99)
Phosphorus: 5.6 mg/dL — ABNORMAL HIGH (ref 2.5–4.6)
Potassium: 4 mmol/L (ref 3.5–5.1)
Sodium: 140 mmol/L (ref 135–145)

## 2022-12-18 LAB — POCT HEMOGLOBIN-HEMACUE: Hemoglobin: 9 g/dL — ABNORMAL LOW (ref 13.0–17.0)

## 2022-12-18 LAB — FERRITIN: Ferritin: 203 ng/mL (ref 24–336)

## 2022-12-18 MED ORDER — EPOETIN ALFA-EPBX 40000 UNIT/ML IJ SOLN
INTRAMUSCULAR | Status: AC
  Filled 2022-12-18: qty 1

## 2022-12-18 MED ORDER — EPOETIN ALFA-EPBX 40000 UNIT/ML IJ SOLN
25000.0000 [IU] | INTRAMUSCULAR | Status: DC
  Administered 2022-12-18: 25000 [IU] via SUBCUTANEOUS

## 2022-12-20 LAB — PTH, INTACT AND CALCIUM
Calcium, Total (PTH): 8.6 mg/dL (ref 8.6–10.2)
PTH: 206 pg/mL — ABNORMAL HIGH (ref 15–65)

## 2022-12-22 DIAGNOSIS — Z01818 Encounter for other preprocedural examination: Secondary | ICD-10-CM | POA: Diagnosis not present

## 2023-01-08 DIAGNOSIS — H4051X3 Glaucoma secondary to other eye disorders, right eye, severe stage: Secondary | ICD-10-CM | POA: Diagnosis not present

## 2023-01-08 DIAGNOSIS — E103593 Type 1 diabetes mellitus with proliferative diabetic retinopathy without macular edema, bilateral: Secondary | ICD-10-CM | POA: Diagnosis not present

## 2023-01-15 ENCOUNTER — Ambulatory Visit (HOSPITAL_COMMUNITY)
Admission: RE | Admit: 2023-01-15 | Discharge: 2023-01-15 | Disposition: A | Discharge status: Home / Self Care | Payer: BC Managed Care – PPO | Source: Ambulatory Visit | Attending: Nephrology | Admitting: Nephrology

## 2023-01-15 VITALS — BP 168/84 | HR 61 | Temp 97.2°F | Resp 16

## 2023-01-15 DIAGNOSIS — N184 Chronic kidney disease, stage 4 (severe): Secondary | ICD-10-CM | POA: Diagnosis not present

## 2023-01-15 LAB — POCT HEMOGLOBIN-HEMACUE: Hemoglobin: 9 g/dL — ABNORMAL LOW (ref 13.0–17.0)

## 2023-01-15 LAB — FERRITIN: Ferritin: 206 ng/mL (ref 24–336)

## 2023-01-15 LAB — RENAL FUNCTION PANEL
Albumin: 3.3 g/dL — ABNORMAL LOW (ref 3.5–5.0)
Anion gap: 10 (ref 5–15)
BUN: 96 mg/dL — ABNORMAL HIGH (ref 8–23)
CO2: 15 mmol/L — ABNORMAL LOW (ref 22–32)
Calcium: 8 mg/dL — ABNORMAL LOW (ref 8.9–10.3)
Chloride: 115 mmol/L — ABNORMAL HIGH (ref 98–111)
Creatinine, Ser: 8.55 mg/dL — ABNORMAL HIGH (ref 0.61–1.24)
GFR, Estimated: 6 mL/min — ABNORMAL LOW (ref >60)
Glucose, Bld: 104 mg/dL — ABNORMAL HIGH (ref 70–99)
Phosphorus: 6.7 mg/dL — ABNORMAL HIGH (ref 2.5–4.6)
Potassium: 4.3 mmol/L (ref 3.5–5.1)
Sodium: 140 mmol/L (ref 135–145)

## 2023-01-15 LAB — IRON AND TIBC
Iron: 56 ug/dL (ref 45–182)
Saturation Ratios: 24 % (ref 17.9–39.5)
TIBC: 235 ug/dL — ABNORMAL LOW (ref 250–450)
UIBC: 179 ug/dL

## 2023-01-15 MED ORDER — EPOETIN ALFA-EPBX 40000 UNIT/ML IJ SOLN
25000.0000 [IU] | INTRAMUSCULAR | Status: DC
  Administered 2023-01-15: 25000 [IU] via SUBCUTANEOUS

## 2023-01-15 MED ORDER — EPOETIN ALFA-EPBX 40000 UNIT/ML IJ SOLN
INTRAMUSCULAR | Status: AC
  Filled 2023-01-15: qty 1

## 2023-01-23 ENCOUNTER — Other Ambulatory Visit: Payer: Self-pay

## 2023-01-23 ENCOUNTER — Telehealth: Payer: Self-pay

## 2023-01-23 DIAGNOSIS — D352 Benign neoplasm of pituitary gland: Secondary | ICD-10-CM

## 2023-01-23 DIAGNOSIS — Z01818 Encounter for other preprocedural examination: Secondary | ICD-10-CM | POA: Diagnosis not present

## 2023-01-23 MED ORDER — VITAMIN D (ERGOCALCIFEROL) 1.25 MG (50000 UNIT) PO CAPS: 50000.0000 [IU] | ORAL_CAPSULE | ORAL | 2 refills | Status: DC

## 2023-01-23 NOTE — Telephone Encounter (Signed)
Vit. D refill request complete

## 2023-01-26 DIAGNOSIS — N185 Chronic kidney disease, stage 5: Secondary | ICD-10-CM | POA: Diagnosis not present

## 2023-02-09 DIAGNOSIS — N185 Chronic kidney disease, stage 5: Secondary | ICD-10-CM | POA: Diagnosis not present

## 2023-02-09 DIAGNOSIS — N2581 Secondary hyperparathyroidism of renal origin: Secondary | ICD-10-CM | POA: Diagnosis not present

## 2023-02-09 DIAGNOSIS — Z992 Dependence on renal dialysis: Secondary | ICD-10-CM | POA: Diagnosis not present

## 2023-02-09 DIAGNOSIS — N186 End stage renal disease: Secondary | ICD-10-CM | POA: Diagnosis not present

## 2023-02-09 DIAGNOSIS — E8770 Fluid overload, unspecified: Secondary | ICD-10-CM | POA: Diagnosis not present

## 2023-02-10 DIAGNOSIS — E8770 Fluid overload, unspecified: Secondary | ICD-10-CM | POA: Diagnosis not present

## 2023-02-10 DIAGNOSIS — N185 Chronic kidney disease, stage 5: Secondary | ICD-10-CM | POA: Diagnosis not present

## 2023-02-10 DIAGNOSIS — N2581 Secondary hyperparathyroidism of renal origin: Secondary | ICD-10-CM | POA: Diagnosis not present

## 2023-02-10 DIAGNOSIS — Z992 Dependence on renal dialysis: Secondary | ICD-10-CM | POA: Diagnosis not present

## 2023-02-10 DIAGNOSIS — N186 End stage renal disease: Secondary | ICD-10-CM | POA: Diagnosis not present

## 2023-02-12 ENCOUNTER — Encounter (HOSPITAL_COMMUNITY): Payer: BC Managed Care – PPO

## 2023-02-12 DIAGNOSIS — N186 End stage renal disease: Secondary | ICD-10-CM | POA: Diagnosis not present

## 2023-02-12 DIAGNOSIS — N2581 Secondary hyperparathyroidism of renal origin: Secondary | ICD-10-CM | POA: Diagnosis not present

## 2023-02-12 DIAGNOSIS — E8770 Fluid overload, unspecified: Secondary | ICD-10-CM | POA: Diagnosis not present

## 2023-02-12 DIAGNOSIS — Z992 Dependence on renal dialysis: Secondary | ICD-10-CM | POA: Diagnosis not present

## 2023-02-12 DIAGNOSIS — N185 Chronic kidney disease, stage 5: Secondary | ICD-10-CM | POA: Diagnosis not present

## 2023-02-13 DIAGNOSIS — Z992 Dependence on renal dialysis: Secondary | ICD-10-CM | POA: Diagnosis not present

## 2023-02-13 DIAGNOSIS — E8770 Fluid overload, unspecified: Secondary | ICD-10-CM | POA: Diagnosis not present

## 2023-02-13 DIAGNOSIS — N2581 Secondary hyperparathyroidism of renal origin: Secondary | ICD-10-CM | POA: Diagnosis not present

## 2023-02-13 DIAGNOSIS — N185 Chronic kidney disease, stage 5: Secondary | ICD-10-CM | POA: Diagnosis not present

## 2023-02-13 DIAGNOSIS — N186 End stage renal disease: Secondary | ICD-10-CM | POA: Diagnosis not present

## 2023-02-16 DIAGNOSIS — N185 Chronic kidney disease, stage 5: Secondary | ICD-10-CM | POA: Diagnosis not present

## 2023-02-16 DIAGNOSIS — E8770 Fluid overload, unspecified: Secondary | ICD-10-CM | POA: Diagnosis not present

## 2023-02-16 DIAGNOSIS — N2581 Secondary hyperparathyroidism of renal origin: Secondary | ICD-10-CM | POA: Diagnosis not present

## 2023-02-16 DIAGNOSIS — N186 End stage renal disease: Secondary | ICD-10-CM | POA: Diagnosis not present

## 2023-02-16 DIAGNOSIS — Z992 Dependence on renal dialysis: Secondary | ICD-10-CM | POA: Diagnosis not present

## 2023-02-17 DIAGNOSIS — Z992 Dependence on renal dialysis: Secondary | ICD-10-CM | POA: Diagnosis not present

## 2023-02-17 DIAGNOSIS — N2581 Secondary hyperparathyroidism of renal origin: Secondary | ICD-10-CM | POA: Diagnosis not present

## 2023-02-17 DIAGNOSIS — N185 Chronic kidney disease, stage 5: Secondary | ICD-10-CM | POA: Diagnosis not present

## 2023-02-17 DIAGNOSIS — N186 End stage renal disease: Secondary | ICD-10-CM | POA: Diagnosis not present

## 2023-02-17 DIAGNOSIS — E8770 Fluid overload, unspecified: Secondary | ICD-10-CM | POA: Diagnosis not present

## 2023-02-19 DIAGNOSIS — N186 End stage renal disease: Secondary | ICD-10-CM | POA: Diagnosis not present

## 2023-02-19 DIAGNOSIS — Z992 Dependence on renal dialysis: Secondary | ICD-10-CM | POA: Diagnosis not present

## 2023-02-19 DIAGNOSIS — N185 Chronic kidney disease, stage 5: Secondary | ICD-10-CM | POA: Diagnosis not present

## 2023-02-19 DIAGNOSIS — E8770 Fluid overload, unspecified: Secondary | ICD-10-CM | POA: Diagnosis not present

## 2023-02-19 DIAGNOSIS — N2581 Secondary hyperparathyroidism of renal origin: Secondary | ICD-10-CM | POA: Diagnosis not present

## 2023-02-20 DIAGNOSIS — N2581 Secondary hyperparathyroidism of renal origin: Secondary | ICD-10-CM | POA: Diagnosis not present

## 2023-02-20 DIAGNOSIS — N185 Chronic kidney disease, stage 5: Secondary | ICD-10-CM | POA: Diagnosis not present

## 2023-02-20 DIAGNOSIS — E8770 Fluid overload, unspecified: Secondary | ICD-10-CM | POA: Diagnosis not present

## 2023-02-20 DIAGNOSIS — N186 End stage renal disease: Secondary | ICD-10-CM | POA: Diagnosis not present

## 2023-02-20 DIAGNOSIS — Z992 Dependence on renal dialysis: Secondary | ICD-10-CM | POA: Diagnosis not present

## 2023-02-22 DIAGNOSIS — Z992 Dependence on renal dialysis: Secondary | ICD-10-CM | POA: Diagnosis not present

## 2023-02-22 DIAGNOSIS — N186 End stage renal disease: Secondary | ICD-10-CM | POA: Diagnosis not present

## 2023-02-22 DIAGNOSIS — E8770 Fluid overload, unspecified: Secondary | ICD-10-CM | POA: Diagnosis not present

## 2023-02-22 DIAGNOSIS — N2581 Secondary hyperparathyroidism of renal origin: Secondary | ICD-10-CM | POA: Diagnosis not present

## 2023-02-22 DIAGNOSIS — N185 Chronic kidney disease, stage 5: Secondary | ICD-10-CM | POA: Diagnosis not present

## 2023-02-23 ENCOUNTER — Other Ambulatory Visit: Payer: BC Managed Care – PPO

## 2023-02-23 DIAGNOSIS — E8770 Fluid overload, unspecified: Secondary | ICD-10-CM | POA: Diagnosis not present

## 2023-02-23 DIAGNOSIS — N186 End stage renal disease: Secondary | ICD-10-CM | POA: Diagnosis not present

## 2023-02-23 DIAGNOSIS — N185 Chronic kidney disease, stage 5: Secondary | ICD-10-CM | POA: Diagnosis not present

## 2023-02-23 DIAGNOSIS — Z992 Dependence on renal dialysis: Secondary | ICD-10-CM | POA: Diagnosis not present

## 2023-02-23 DIAGNOSIS — N2581 Secondary hyperparathyroidism of renal origin: Secondary | ICD-10-CM | POA: Diagnosis not present

## 2023-02-24 ENCOUNTER — Telehealth: Payer: Self-pay | Admitting: Endocrinology

## 2023-02-24 ENCOUNTER — Other Ambulatory Visit: Payer: Self-pay

## 2023-02-24 DIAGNOSIS — N185 Chronic kidney disease, stage 5: Secondary | ICD-10-CM | POA: Diagnosis not present

## 2023-02-24 DIAGNOSIS — N186 End stage renal disease: Secondary | ICD-10-CM | POA: Diagnosis not present

## 2023-02-24 DIAGNOSIS — D352 Benign neoplasm of pituitary gland: Secondary | ICD-10-CM

## 2023-02-24 DIAGNOSIS — E8770 Fluid overload, unspecified: Secondary | ICD-10-CM | POA: Diagnosis not present

## 2023-02-24 DIAGNOSIS — Z992 Dependence on renal dialysis: Secondary | ICD-10-CM | POA: Diagnosis not present

## 2023-02-24 DIAGNOSIS — N2581 Secondary hyperparathyroidism of renal origin: Secondary | ICD-10-CM | POA: Diagnosis not present

## 2023-02-24 MED ORDER — CABERGOLINE 0.5 MG PO TABS: 0.2500 mg | ORAL_TABLET | ORAL | 2 refills | Status: DC

## 2023-02-24 NOTE — Telephone Encounter (Signed)
Dostinex refill request complete

## 2023-02-24 NOTE — Telephone Encounter (Signed)
MEDICATION: cabergoline cabergoline (DOSTINEX) 0.5 MG tablet  PHARMACY:    [id number] - [place], [place] - [address] AT [address] [facility] [address] (Ph: [phone])    HAS THE PATIENT CONTACTED THEIR PHARMACY?    IS THIS A 90 DAY SUPPLY : Yes  IS PATIENT OUT OF MEDICATION: Yes  IF NOT; HOW MUCH IS LEFT:   LAST APPOINTMENT DATE: @6 /06/2022  NEXT APPOINTMENT DATE:@1 /10/2023  DO WE HAVE YOUR PERMISSION TO LEAVE A DETAILED MESSAGE?:Yes  OTHER COMMENTS:    **Let patient know to contact pharmacy at the end of the day to make sure medication is ready. **  ** Please notify patient to allow 48-72 hours to process**  **Encourage patient to contact the pharmacy for refills or they can request refills through [facility]**

## 2023-02-27 DIAGNOSIS — E8770 Fluid overload, unspecified: Secondary | ICD-10-CM | POA: Diagnosis not present

## 2023-02-27 DIAGNOSIS — N2581 Secondary hyperparathyroidism of renal origin: Secondary | ICD-10-CM | POA: Diagnosis not present

## 2023-02-27 DIAGNOSIS — N185 Chronic kidney disease, stage 5: Secondary | ICD-10-CM | POA: Diagnosis not present

## 2023-02-27 DIAGNOSIS — Z992 Dependence on renal dialysis: Secondary | ICD-10-CM | POA: Diagnosis not present

## 2023-02-27 DIAGNOSIS — N186 End stage renal disease: Secondary | ICD-10-CM | POA: Diagnosis not present

## 2023-02-28 DIAGNOSIS — E1121 Type 2 diabetes mellitus with diabetic nephropathy: Secondary | ICD-10-CM | POA: Diagnosis not present

## 2023-02-28 DIAGNOSIS — Z992 Dependence on renal dialysis: Secondary | ICD-10-CM | POA: Diagnosis not present

## 2023-02-28 DIAGNOSIS — N186 End stage renal disease: Secondary | ICD-10-CM | POA: Diagnosis not present

## 2023-03-02 ENCOUNTER — Ambulatory Visit: Payer: BC Managed Care – PPO | Admitting: Endocrinology

## 2023-03-02 DIAGNOSIS — Z992 Dependence on renal dialysis: Secondary | ICD-10-CM | POA: Diagnosis not present

## 2023-03-02 DIAGNOSIS — H4053X4 Glaucoma secondary to other eye disorders, bilateral, indeterminate stage: Secondary | ICD-10-CM | POA: Diagnosis not present

## 2023-03-02 DIAGNOSIS — H4051X3 Glaucoma secondary to other eye disorders, right eye, severe stage: Secondary | ICD-10-CM | POA: Diagnosis not present

## 2023-03-02 DIAGNOSIS — N186 End stage renal disease: Secondary | ICD-10-CM | POA: Diagnosis not present

## 2023-03-02 DIAGNOSIS — N2581 Secondary hyperparathyroidism of renal origin: Secondary | ICD-10-CM | POA: Diagnosis not present

## 2023-03-02 DIAGNOSIS — N185 Chronic kidney disease, stage 5: Secondary | ICD-10-CM | POA: Diagnosis not present

## 2023-03-02 DIAGNOSIS — E8770 Fluid overload, unspecified: Secondary | ICD-10-CM | POA: Diagnosis not present

## 2023-03-02 DIAGNOSIS — E103593 Type 1 diabetes mellitus with proliferative diabetic retinopathy without macular edema, bilateral: Secondary | ICD-10-CM | POA: Diagnosis not present

## 2023-03-03 DIAGNOSIS — N186 End stage renal disease: Secondary | ICD-10-CM | POA: Diagnosis not present

## 2023-03-03 DIAGNOSIS — Z992 Dependence on renal dialysis: Secondary | ICD-10-CM | POA: Diagnosis not present

## 2023-03-03 DIAGNOSIS — E8770 Fluid overload, unspecified: Secondary | ICD-10-CM | POA: Diagnosis not present

## 2023-03-03 DIAGNOSIS — N185 Chronic kidney disease, stage 5: Secondary | ICD-10-CM | POA: Diagnosis not present

## 2023-03-03 DIAGNOSIS — N2581 Secondary hyperparathyroidism of renal origin: Secondary | ICD-10-CM | POA: Diagnosis not present

## 2023-03-05 ENCOUNTER — Telehealth: Payer: Self-pay | Admitting: Endocrinology

## 2023-03-05 DIAGNOSIS — Z992 Dependence on renal dialysis: Secondary | ICD-10-CM | POA: Diagnosis not present

## 2023-03-05 DIAGNOSIS — N186 End stage renal disease: Secondary | ICD-10-CM | POA: Diagnosis not present

## 2023-03-05 DIAGNOSIS — N2581 Secondary hyperparathyroidism of renal origin: Secondary | ICD-10-CM | POA: Diagnosis not present

## 2023-03-05 DIAGNOSIS — E8770 Fluid overload, unspecified: Secondary | ICD-10-CM | POA: Diagnosis not present

## 2023-03-05 DIAGNOSIS — N185 Chronic kidney disease, stage 5: Secondary | ICD-10-CM | POA: Diagnosis not present

## 2023-03-05 NOTE — Telephone Encounter (Signed)
Called patient @ [phone], per request by his son to call back on the provided phone number, called twice, no answer, no voice message set up.

## 2023-03-06 DIAGNOSIS — N186 End stage renal disease: Secondary | ICD-10-CM | POA: Diagnosis not present

## 2023-03-06 DIAGNOSIS — E8770 Fluid overload, unspecified: Secondary | ICD-10-CM | POA: Diagnosis not present

## 2023-03-06 DIAGNOSIS — N2581 Secondary hyperparathyroidism of renal origin: Secondary | ICD-10-CM | POA: Diagnosis not present

## 2023-03-06 DIAGNOSIS — N185 Chronic kidney disease, stage 5: Secondary | ICD-10-CM | POA: Diagnosis not present

## 2023-03-06 DIAGNOSIS — Z992 Dependence on renal dialysis: Secondary | ICD-10-CM | POA: Diagnosis not present

## 2023-03-09 DIAGNOSIS — N186 End stage renal disease: Secondary | ICD-10-CM | POA: Diagnosis not present

## 2023-03-09 DIAGNOSIS — N2581 Secondary hyperparathyroidism of renal origin: Secondary | ICD-10-CM | POA: Diagnosis not present

## 2023-03-09 DIAGNOSIS — Z992 Dependence on renal dialysis: Secondary | ICD-10-CM | POA: Diagnosis not present

## 2023-03-09 DIAGNOSIS — E8770 Fluid overload, unspecified: Secondary | ICD-10-CM | POA: Diagnosis not present

## 2023-03-09 DIAGNOSIS — N185 Chronic kidney disease, stage 5: Secondary | ICD-10-CM | POA: Diagnosis not present

## 2023-03-10 DIAGNOSIS — E8770 Fluid overload, unspecified: Secondary | ICD-10-CM | POA: Diagnosis not present

## 2023-03-10 DIAGNOSIS — N2581 Secondary hyperparathyroidism of renal origin: Secondary | ICD-10-CM | POA: Diagnosis not present

## 2023-03-10 DIAGNOSIS — Z992 Dependence on renal dialysis: Secondary | ICD-10-CM | POA: Diagnosis not present

## 2023-03-10 DIAGNOSIS — N186 End stage renal disease: Secondary | ICD-10-CM | POA: Diagnosis not present

## 2023-03-10 DIAGNOSIS — Z01818 Encounter for other preprocedural examination: Secondary | ICD-10-CM | POA: Diagnosis not present

## 2023-03-10 DIAGNOSIS — N185 Chronic kidney disease, stage 5: Secondary | ICD-10-CM | POA: Diagnosis not present

## 2023-03-12 ENCOUNTER — Encounter (HOSPITAL_COMMUNITY): Payer: BC Managed Care – PPO

## 2023-03-12 DIAGNOSIS — N185 Chronic kidney disease, stage 5: Secondary | ICD-10-CM | POA: Diagnosis not present

## 2023-03-12 DIAGNOSIS — N2581 Secondary hyperparathyroidism of renal origin: Secondary | ICD-10-CM | POA: Diagnosis not present

## 2023-03-12 DIAGNOSIS — H4051X3 Glaucoma secondary to other eye disorders, right eye, severe stage: Secondary | ICD-10-CM | POA: Diagnosis not present

## 2023-03-12 DIAGNOSIS — N186 End stage renal disease: Secondary | ICD-10-CM | POA: Diagnosis not present

## 2023-03-12 DIAGNOSIS — Z992 Dependence on renal dialysis: Secondary | ICD-10-CM | POA: Diagnosis not present

## 2023-03-12 DIAGNOSIS — E8770 Fluid overload, unspecified: Secondary | ICD-10-CM | POA: Diagnosis not present

## 2023-03-13 DIAGNOSIS — N2581 Secondary hyperparathyroidism of renal origin: Secondary | ICD-10-CM | POA: Diagnosis not present

## 2023-03-13 DIAGNOSIS — N186 End stage renal disease: Secondary | ICD-10-CM | POA: Diagnosis not present

## 2023-03-13 DIAGNOSIS — Z992 Dependence on renal dialysis: Secondary | ICD-10-CM | POA: Diagnosis not present

## 2023-03-13 DIAGNOSIS — N185 Chronic kidney disease, stage 5: Secondary | ICD-10-CM | POA: Diagnosis not present

## 2023-03-13 DIAGNOSIS — E8770 Fluid overload, unspecified: Secondary | ICD-10-CM | POA: Diagnosis not present

## 2023-03-16 DIAGNOSIS — E8770 Fluid overload, unspecified: Secondary | ICD-10-CM | POA: Diagnosis not present

## 2023-03-16 DIAGNOSIS — N2581 Secondary hyperparathyroidism of renal origin: Secondary | ICD-10-CM | POA: Diagnosis not present

## 2023-03-16 DIAGNOSIS — Z992 Dependence on renal dialysis: Secondary | ICD-10-CM | POA: Diagnosis not present

## 2023-03-16 DIAGNOSIS — N186 End stage renal disease: Secondary | ICD-10-CM | POA: Diagnosis not present

## 2023-03-17 DIAGNOSIS — N2581 Secondary hyperparathyroidism of renal origin: Secondary | ICD-10-CM | POA: Diagnosis not present

## 2023-03-17 DIAGNOSIS — E8770 Fluid overload, unspecified: Secondary | ICD-10-CM | POA: Diagnosis not present

## 2023-03-17 DIAGNOSIS — N186 End stage renal disease: Secondary | ICD-10-CM | POA: Diagnosis not present

## 2023-03-17 DIAGNOSIS — Z992 Dependence on renal dialysis: Secondary | ICD-10-CM | POA: Diagnosis not present

## 2023-03-20 DIAGNOSIS — N2581 Secondary hyperparathyroidism of renal origin: Secondary | ICD-10-CM | POA: Diagnosis not present

## 2023-03-20 DIAGNOSIS — Z992 Dependence on renal dialysis: Secondary | ICD-10-CM | POA: Diagnosis not present

## 2023-03-20 DIAGNOSIS — N186 End stage renal disease: Secondary | ICD-10-CM | POA: Diagnosis not present

## 2023-03-22 DIAGNOSIS — Z992 Dependence on renal dialysis: Secondary | ICD-10-CM | POA: Diagnosis not present

## 2023-03-22 DIAGNOSIS — N186 End stage renal disease: Secondary | ICD-10-CM | POA: Diagnosis not present

## 2023-03-22 DIAGNOSIS — N2581 Secondary hyperparathyroidism of renal origin: Secondary | ICD-10-CM | POA: Diagnosis not present

## 2023-03-24 DIAGNOSIS — N186 End stage renal disease: Secondary | ICD-10-CM | POA: Diagnosis not present

## 2023-03-24 DIAGNOSIS — Z992 Dependence on renal dialysis: Secondary | ICD-10-CM | POA: Diagnosis not present

## 2023-03-24 DIAGNOSIS — N2581 Secondary hyperparathyroidism of renal origin: Secondary | ICD-10-CM | POA: Diagnosis not present

## 2023-03-27 DIAGNOSIS — N2581 Secondary hyperparathyroidism of renal origin: Secondary | ICD-10-CM | POA: Diagnosis not present

## 2023-03-27 DIAGNOSIS — Z992 Dependence on renal dialysis: Secondary | ICD-10-CM | POA: Diagnosis not present

## 2023-03-27 DIAGNOSIS — N186 End stage renal disease: Secondary | ICD-10-CM | POA: Diagnosis not present

## 2023-03-29 DIAGNOSIS — Z992 Dependence on renal dialysis: Secondary | ICD-10-CM | POA: Diagnosis not present

## 2023-03-29 DIAGNOSIS — N186 End stage renal disease: Secondary | ICD-10-CM | POA: Diagnosis not present

## 2023-03-29 DIAGNOSIS — N2581 Secondary hyperparathyroidism of renal origin: Secondary | ICD-10-CM | POA: Diagnosis not present

## 2023-03-31 DIAGNOSIS — E1121 Type 2 diabetes mellitus with diabetic nephropathy: Secondary | ICD-10-CM | POA: Diagnosis not present

## 2023-03-31 DIAGNOSIS — N2581 Secondary hyperparathyroidism of renal origin: Secondary | ICD-10-CM | POA: Diagnosis not present

## 2023-03-31 DIAGNOSIS — N186 End stage renal disease: Secondary | ICD-10-CM | POA: Diagnosis not present

## 2023-03-31 DIAGNOSIS — Z992 Dependence on renal dialysis: Secondary | ICD-10-CM | POA: Diagnosis not present

## 2023-04-03 DIAGNOSIS — N186 End stage renal disease: Secondary | ICD-10-CM | POA: Diagnosis not present

## 2023-04-03 DIAGNOSIS — Z992 Dependence on renal dialysis: Secondary | ICD-10-CM | POA: Diagnosis not present

## 2023-04-03 DIAGNOSIS — N2581 Secondary hyperparathyroidism of renal origin: Secondary | ICD-10-CM | POA: Diagnosis not present

## 2023-04-06 DIAGNOSIS — Z992 Dependence on renal dialysis: Secondary | ICD-10-CM | POA: Diagnosis not present

## 2023-04-06 DIAGNOSIS — N186 End stage renal disease: Secondary | ICD-10-CM | POA: Diagnosis not present

## 2023-04-06 DIAGNOSIS — N2581 Secondary hyperparathyroidism of renal origin: Secondary | ICD-10-CM | POA: Diagnosis not present

## 2023-04-08 ENCOUNTER — Ambulatory Visit: Payer: BC Managed Care – PPO | Admitting: Endocrinology

## 2023-04-08 DIAGNOSIS — N186 End stage renal disease: Secondary | ICD-10-CM | POA: Diagnosis not present

## 2023-04-08 DIAGNOSIS — N2581 Secondary hyperparathyroidism of renal origin: Secondary | ICD-10-CM | POA: Diagnosis not present

## 2023-04-08 DIAGNOSIS — Z992 Dependence on renal dialysis: Secondary | ICD-10-CM | POA: Diagnosis not present

## 2023-04-09 ENCOUNTER — Encounter: Payer: Self-pay | Admitting: Endocrinology

## 2023-04-09 ENCOUNTER — Ambulatory Visit: Payer: BC Managed Care – PPO | Admitting: Endocrinology

## 2023-04-09 VITALS — BP 126/60 | HR 62 | Resp 20 | Ht 72.0 in | Wt 198.0 lb

## 2023-04-09 DIAGNOSIS — E1169 Type 2 diabetes mellitus with other specified complication: Secondary | ICD-10-CM | POA: Diagnosis not present

## 2023-04-09 DIAGNOSIS — E221 Hyperprolactinemia: Secondary | ICD-10-CM | POA: Diagnosis not present

## 2023-04-09 DIAGNOSIS — E119 Type 2 diabetes mellitus without complications: Secondary | ICD-10-CM | POA: Diagnosis not present

## 2023-04-09 DIAGNOSIS — R7989 Other specified abnormal findings of blood chemistry: Secondary | ICD-10-CM | POA: Diagnosis not present

## 2023-04-09 DIAGNOSIS — D352 Benign neoplasm of pituitary gland: Secondary | ICD-10-CM

## 2023-04-09 NOTE — Progress Notes (Addendum)
 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   Pertinent History: Patient was previously seen by Dr. Von and was last time seen in June 2024.  # Macroprolactinoma / hyperprolactinemia - Patient had MRI on 07/18/14 showed a pituitary tumor with the following description: 2.3 x 2 x 1.8 cm mass centered in the sella with suprasellar extension and cavernous sinus extension greater on the left with an appearance most suggestive of pituitary macroadenoma. This impresses upon the optic chiasm. Had difficulty with peripheral vision on the left side but this resolved.  Visual difficulties currently have been unrelated to the pituitary tumor. Eyes: He has multiple problems including vitreous hemorrhage, glaucoma with markedly decreased vision bilaterally.    MRI in 2/17 showing focal area of delayed enhancement this represents a pituitary adenoma on the left.  This lesion measures 15 x 12 x 11 mm.    Has had a follow-up MRI in 10/2019 which showed the following: decreased size of mass along the left aspect of the sella turcica. Residual soft tissue along the sellar floor measures approximately 11 x 5 mm. No mass effect on the optic chiasm.  - Hyperprolactinemia:  At baseline he had a prolactin level checked and this was markedly increased at 1312. He had symptoms of decreased libido and gynecomastia. Testosterone  level at baseline was low at 70.  - He  was started on Dostinex  0.5 mg initially half tablet twice a week and then 1 tablet twice a week on his initial consultation on 08/04/14. He has been taking this regularly without any side effects like  nausea   He is continuing to be taking half tablet of Dostinex  alternating with 1 tablet a week on Tuesdays and Fridays   He had transiently low testosterone  after initial diagnosis of large prolactinoma and has not been on treatments previously.  Mostly total testosterone  were normal in the past.  # Type 2 diabetes mellitus controlled - He was diagnosed  in 2014  and has been previously managed by a primary care physician who he works with. On admission to the hospital in 2016 his glucose was 380 without any evidence of ketosis. Highest A1c had been 8% in 2018, subsequently in the normal range since 11/18, lately mostly in 5 range.  However he has ESRD and anemia as well.  He has not been on any antidiabetic medication.  He has been on diet control and exercise.  He has previously seen dietitian.  He has been checking blood sugar sporadically.  Blood sugar has been in acceptable range no concerning hyperglycemia.  Other: ESRD following with nephrology on hemodialysis, waiting for renal transplant.  Interval history Patient was started on hemodialysis for ESRD from November 2024.  Patient reports had mild tenderness on the nipple lately, no discharge or galactorrhea.  He has been taking cabergoline  half tablet and 1 tablet of 0.5 mg 2 times a week.  No new headache.  Overall normal energy.  He reports he checks blood sugar sporadically and has always been in 90 range, blood sugar during hemodialysis also in low 100 range.  No other complaints today.  REVIEW OF  SYSTEMS:  As per history of present illness.   PAST MEDICAL HISTORY: Past Medical History:  Diagnosis Date   Anemia    Chronic kidney disease    Stage 4   Detached retina    Diabetes mellitus without complication (HCC)    pt states his A1C was has been in normal range for several years now.   H/O hypogonadism    History of kidney stones    passed   History of pituitary tumor    Hypertension    Prolactinoma, benign (HCC)     Vision loss, bilateral     PAST SURGICAL HISTORY: Past Surgical History:  Procedure Laterality Date   AV FISTULA PLACEMENT Left 12/25/2021   Procedure: LEFT RADIAL CEPHALIC FISTULA CREATION;  Surgeon: Serene Gaile ORN, MD;  Location: [facility] OR;  Service: Vascular;  Laterality: Left;   AV FISTULA PLACEMENT Left 03/26/2022   Procedure: LEFT UPPER EXTREMITY BRACHIOCEPHALIC ARTERIOVENOUS (AV) FISTULA CREATION;  Surgeon: Serene Gaile ORN, MD;  Location: [facility] OR;  Service: Vascular;  Laterality: Left;   BASCILIC VEIN TRANSPOSITION Left 07/18/2022   Procedure: LEFT SECOND STAGE BASILIC VEIN TRANSPOSITION;  Surgeon: Serene Gaile ORN, MD;  Location: [facility] OR;  Service: Vascular;  Laterality: Left;   COLONOSCOPY     EYE SURGERY     retina surgery - 2018?   KIDNEY STONE SURGERY  1987    ALLERGIES: Allergies  Allergen Reactions   Contrast Media [Iodinated Contrast Media] Rash    FAMILY HISTORY:  Family History  Problem Relation Age of Onset   Diabetes Mother    Colon polyps Mother    Heart disease Mother    Hypertension Mother    Colon cancer Father    Kidney disease Maternal Grandmother    Colon cancer Maternal Grandfather    Esophageal cancer Maternal Uncle    Colon cancer Maternal Uncle     SOCIAL HISTORY: Social History   Socioeconomic History   Marital status: Married    Spouse name: Not on file   Number of children: 0   Years of education: Not on file   Highest education level: Not on file  Occupational History   Occupation: MD  Tobacco Use   Smoking status: Never    Passive exposure: Never   Smokeless tobacco: Never  Vaping Use   Vaping status: Never Used  Substance and Sexual Activity   Alcohol use: No   Drug use: No   Sexual activity: Yes  Other Topics Concern   Not on file  Social History Narrative   Not on file   Social Drivers of Health   Financial Resource Strain: Not on file  Food Insecurity: Not on file  Transportation Needs: Not on file  Physical  Activity: Not on file  Stress: Not on file  Social Connections: Not on file    MEDICATIONS:  Current Outpatient Medications  Medication Sig Dispense Refill   acetaminophen  (TYLENOL ) 500 MG tablet Take 500-1,000 mg by mouth every 4 (four) hours as needed for moderate pain.     aspirin  EC 81 MG tablet Take 81 mg by mouth daily.     atropine 1 % ophthalmic solution Place 1 drop into the right eye daily.     brimonidine (ALPHAGAN) 0.2 % ophthalmic solution Place 1 drop into both eyes in the morning and at bedtime.     cabergoline  (DOSTINEX ) 0.5 MG tablet Take 0.5-1 tablets (0.25-0.5 mg total) by mouth See admin instructions. Take one tablet (0.5 mg) by mouth on  Tuesday's and one half tablet (0.25 mg) on Friday's 12 tablet 2   calcitRIOL (ROCALTROL) 0.25 MCG capsule Take 0.25 mcg by mouth daily.     cloNIDine  (CATAPRES  - DOSED IN MG/24 HR) 0.3 mg/24hr patch Place 0.3 mg onto the skin once a week. Changes on Friday.     cloNIDine  (CATAPRES ) 0.1 MG tablet Take 0.1 mg by mouth 3 (three) times daily as needed (Breakthrough hypertension).     dorzolamide-timolol (COSOPT) 2-0.5 % ophthalmic solution Place 1 drop into both eyes 2 (two) times daily.     hydrALAZINE  (APRESOLINE ) 25 MG tablet Take 25 mg by mouth 3 (three) times daily.     hydrALAZINE  (APRESOLINE ) 50 MG tablet Take 50 mg by mouth in the morning and at bedtime.     isosorbide  mononitrate (IMDUR ) 60 MG 24 hr tablet Take 60 mg by mouth daily. AM     Multiple Vitamins-Minerals (MULTIVITAL PO) Take 1 tablet by mouth daily.     nebivolol (BYSTOLIC) 5 MG tablet Take 5 mg by mouth at bedtime.     NIFEdipine (ADALAT CC) 30 MG 24 hr tablet Take 30 mg by mouth daily. AM     oxyCODONE -acetaminophen  (PERCOCET) 5-325 MG tablet Take 1 tablet by mouth every 6 (six) hours as needed for severe pain. 15 tablet 0   timolol (BETIMOL) 0.25 % ophthalmic solution Place 1-2 drops into both eyes 2 (two) times daily.     VITAMIN A PO Take 1 tablet by mouth daily.      Vitamin D , Ergocalciferol , (DRISDOL ) 1.25 MG (50000 UNIT) CAPS capsule Take 1 capsule (50,000 Units total) by mouth every 7 (seven) days. 12 capsule 2   Current Facility-Administered Medications  Medication Dose Route Frequency Provider Last Rate Last Admin   0.9 %  sodium chloride  infusion  500 mL Intravenous Continuous Armbruster, Elspeth SQUIBB, MD        PHYSICAL EXAM: Vitals:   04/09/23 0934  BP: 126/60  Pulse: 62  Resp: 20  SpO2: 95%  Weight: 198 lb (89.8 kg)  Height: 6' (1.829 m)   Body mass index is 26.85 kg/m.  Wt Readings from Last 3 Encounters:  04/09/23 198 lb (89.8 kg)  09/02/22 229 lb (103.9 kg)  08/18/22 220 lb (99.8 kg)    General: Well developed, well nourished male in no apparent distress.  HEENT: AT/[place], no external lesions. Hearing intact to the spoken word Eyes: Decreased vision significantly bilaterally.   Neck: Trachea midline, neck supple Neurologic: Alert, oriented, normal speech Extremities: No pedal pitting edema Skin: Warm Psychiatric: Does not appear depressed or anxious  PERTINENT HISTORIC LABORATORY AND IMAGING STUDIES:  All pertinent laboratory results were reviewed. Please see HPI also for further details.    Latest Reference Range & Units 05/28/20 11:31 04/12/21 13:50 12/13/21 14:51 09/02/22 11:12  Prolactin 3.6 - 25.2 ng/mL 19.3 (H) 6.3 8.5 16.8  (H): Data is abnormally high   Latest Reference Range & Units 05/28/20 11:31 04/12/21 13:50 12/13/21 14:51 09/02/22 11:12  Testosterone  300.00 - 890.00 ng/dL 706.98 (L) 708.41 (L) 557 214.71 (L)  (L): Data is abnormally low   Latest Reference Range & Units 05/28/20 11:27 04/12/21 13:50 12/13/21 14:51 09/02/22 11:07  Hemoglobin A1C 4.0 - 5.6 % -  5.2 Pend 5.3 5.0 5.0 Pend    ASSESSMENT / PLAN  1. Macroprolactinoma (HCC)   2. Prolactin-secreting pituitary adenoma (HCC)   3. Controlled type 2 diabetes mellitus without complication, without long-term current use of insulin  (HCC)   4. Low  testosterone    5. Hyperprolactinemia (HCC)     - Patient has macroprolactinoma with hyperprolactinemia since 2016.  Baseline pituitary adenoma was 2.3 cm,, size has significantly increased after being on cabergoline  to 1.1 cm as of MRI in August 2021.  Baseline prolactin was 1315, prolactin has normalized after being on cabergoline .  -Currently taking cabergoline  0.25 mg and 0.5 mg a week, on Fridays and Tuesdays respectively, total of 0.75 mg/week.   -No galactorrhea.  Patient complains of occasional nipple tenderness lately after started on hemodialysis for ESRD November 2024.  -He had low testosterone  when 18 tumor was diagnosed however lately has been mostly in the normal range with occasionally mildly low total testosterone .  Denies symptoms.  Has not been on testosterone  therapy.  Plan: -Check prolactin level -Check total, free testosterone  along with gonadotropins.  # Controlled type 2 diabetes mellitus -Controlled on diet alone and he also does exercise.  He has not been on antidiabetic medication for several years.  A1c has remained mostly in the 5 range.  However he has anemia and ESRD.  Blood sugar has been controlled no concerning hyperglycemia. -Check hemoglobin A1c today -Advised to monitor blood sugar at home few times a week.  Asked to bring glucometer in the follow-up visit. -No plan for antidiabetic medication. -Will manage with diet and exercise.  Endocrinology follow-up every 6 months.  Diagnoses and all orders for this visit:  Macroprolactinoma [facility]) -     Prolactin  Prolactin-secreting pituitary adenoma (HCC)  Controlled type 2 diabetes mellitus without complication, without long-term current use of insulin  (HCC) -     Hemoglobin A1c  Low testosterone  -     Testosterone  , Free and Total -     Luteinizing hormone -     Follicle stimulating hormone  Hyperprolactinemia (HCC)    Latest Reference Range & Units 04/09/23 10:01  LH 1.6 - 15.2 mIU/mL 4.5  FSH  1.4 - 12.8 mIU/mL 4.3  Prolactin 2.0 - 18.0 ng/mL 131.0 (H)  eAG (mmol/L) mmol/L 5.0  Hemoglobin A1C <5.7 % of total Hgb 4.8  (H): Data is abnormally high  Hemoglobin A1c 4.8.  Prolactin elevated.  I would like to increase cabergoline  to 0.5 mg 2 times a week.  Recheck prolactin in a month.  DISPOSITION Follow up in clinic in 6 months suggested.  All questions answered and patient verbalized understanding of the plan.  Mercades Bajaj, MD [facility] Endocrinology [facility] [address] [place], [address] [place], [place] [address] Phone # [phone]  At least part of this note was generated using voice recognition software. Inadvertent word errors may have occurred, which were not recognized during the proofreading process.

## 2023-04-10 ENCOUNTER — Telehealth: Payer: Self-pay

## 2023-04-10 DIAGNOSIS — N2581 Secondary hyperparathyroidism of renal origin: Secondary | ICD-10-CM | POA: Diagnosis not present

## 2023-04-10 DIAGNOSIS — Z992 Dependence on renal dialysis: Secondary | ICD-10-CM | POA: Diagnosis not present

## 2023-04-10 DIAGNOSIS — N186 End stage renal disease: Secondary | ICD-10-CM | POA: Diagnosis not present

## 2023-04-10 MED ORDER — CABERGOLINE 0.5 MG PO TABS
0.5000 mg | ORAL_TABLET | ORAL | 2 refills | Status: DC
Start: 2023-04-13 — End: 2023-06-05

## 2023-04-10 NOTE — Telephone Encounter (Signed)
-----   Message from Roberto [name] sent at 04/10/2023 11:30 AM EST ----- Please notify patient of prolactin elevated I would like to increase prolactin to 1 tablet 0.5 mg 2 times a week.  Recheck prolactin in 1 month.  Order placed.  Please inform the patient and arrange for lab, in the morning fasting.

## 2023-04-10 NOTE — Addendum Note (Signed)
 Addended by: Latravion Graves, [place] on: 04/10/2023 11:32 AM   Modules accepted: Orders

## 2023-04-10 NOTE — Telephone Encounter (Signed)
 Patient given results and medication changes as directed by MD. Patient had concerns about medication being dialyzed out would like to discuss with MD the best days to take this. Patient was to be transferred to front desk to place lab appointment but received another call and had to go but was aware that he needs to call and schedule for fasting labs

## 2023-04-13 DIAGNOSIS — N2581 Secondary hyperparathyroidism of renal origin: Secondary | ICD-10-CM | POA: Diagnosis not present

## 2023-04-13 DIAGNOSIS — Z992 Dependence on renal dialysis: Secondary | ICD-10-CM | POA: Diagnosis not present

## 2023-04-13 DIAGNOSIS — N186 End stage renal disease: Secondary | ICD-10-CM | POA: Diagnosis not present

## 2023-04-15 DIAGNOSIS — Z01818 Encounter for other preprocedural examination: Secondary | ICD-10-CM | POA: Diagnosis not present

## 2023-04-15 DIAGNOSIS — N186 End stage renal disease: Secondary | ICD-10-CM | POA: Diagnosis not present

## 2023-04-15 DIAGNOSIS — N2581 Secondary hyperparathyroidism of renal origin: Secondary | ICD-10-CM | POA: Diagnosis not present

## 2023-04-15 DIAGNOSIS — Z992 Dependence on renal dialysis: Secondary | ICD-10-CM | POA: Diagnosis not present

## 2023-04-17 DIAGNOSIS — N186 End stage renal disease: Secondary | ICD-10-CM | POA: Diagnosis not present

## 2023-04-17 DIAGNOSIS — N2581 Secondary hyperparathyroidism of renal origin: Secondary | ICD-10-CM | POA: Diagnosis not present

## 2023-04-17 DIAGNOSIS — Z992 Dependence on renal dialysis: Secondary | ICD-10-CM | POA: Diagnosis not present

## 2023-04-17 LAB — LUTEINIZING HORMONE: LH: 4.5 m[IU]/mL (ref 1.6–15.2)

## 2023-04-17 LAB — HEMOGLOBIN A1C
Hgb A1c MFr Bld: 4.8 %{Hb} (ref <5.7)
Mean Plasma Glucose: 91 mg/dL
eAG (mmol/L): 5 mmol/L

## 2023-04-17 LAB — FOLLICLE STIMULATING HORMONE: FSH: 4.3 m[IU]/mL (ref 1.4–12.8)

## 2023-04-17 LAB — PROLACTIN: Prolactin: 131 ng/mL — ABNORMAL HIGH (ref 2.0–18.0)

## 2023-04-17 LAB — TESTOSTERONE, FREE & TOTAL
Free Testosterone: 32.2 pg/mL — ABNORMAL LOW (ref 35.0–155.0)
Testosterone, Total, LC-MS-MS: 194 ng/dL — ABNORMAL LOW (ref 250–1100)

## 2023-04-20 DIAGNOSIS — N186 End stage renal disease: Secondary | ICD-10-CM | POA: Diagnosis not present

## 2023-04-20 DIAGNOSIS — N2581 Secondary hyperparathyroidism of renal origin: Secondary | ICD-10-CM | POA: Diagnosis not present

## 2023-04-20 DIAGNOSIS — Z992 Dependence on renal dialysis: Secondary | ICD-10-CM | POA: Diagnosis not present

## 2023-04-22 DIAGNOSIS — N186 End stage renal disease: Secondary | ICD-10-CM | POA: Diagnosis not present

## 2023-04-22 DIAGNOSIS — Z992 Dependence on renal dialysis: Secondary | ICD-10-CM | POA: Diagnosis not present

## 2023-04-22 DIAGNOSIS — N2581 Secondary hyperparathyroidism of renal origin: Secondary | ICD-10-CM | POA: Diagnosis not present

## 2023-04-27 ENCOUNTER — Other Ambulatory Visit: Payer: Self-pay

## 2023-04-27 ENCOUNTER — Ambulatory Visit (HOSPITAL_COMMUNITY)
Admission: RE | Admit: 2023-04-27 | Discharge: 2023-04-27 | Disposition: A | Discharge status: Home / Self Care | Payer: BC Managed Care – PPO | Source: Ambulatory Visit | Attending: Nephrology | Admitting: Nephrology

## 2023-04-27 ENCOUNTER — Encounter (HOSPITAL_COMMUNITY)
Admission: RE | Disposition: A | Discharge status: Home / Self Care | Payer: Self-pay | Source: Ambulatory Visit | Attending: Nephrology

## 2023-04-27 DIAGNOSIS — D631 Anemia in chronic kidney disease: Secondary | ICD-10-CM | POA: Insufficient documentation

## 2023-04-27 DIAGNOSIS — Z79899 Other long term (current) drug therapy: Secondary | ICD-10-CM | POA: Insufficient documentation

## 2023-04-27 DIAGNOSIS — N250 Renal osteodystrophy: Secondary | ICD-10-CM | POA: Diagnosis not present

## 2023-04-27 DIAGNOSIS — T82868A Thrombosis of vascular prosthetic devices, implants and grafts, initial encounter: Secondary | ICD-10-CM | POA: Insufficient documentation

## 2023-04-27 DIAGNOSIS — I120 Hypertensive chronic kidney disease with stage 5 chronic kidney disease or end stage renal disease: Secondary | ICD-10-CM | POA: Diagnosis not present

## 2023-04-27 DIAGNOSIS — Z992 Dependence on renal dialysis: Secondary | ICD-10-CM | POA: Insufficient documentation

## 2023-04-27 DIAGNOSIS — Y832 Surgical operation with anastomosis, bypass or graft as the cause of abnormal reaction of the patient, or of later complication, without mention of misadventure at the time of the procedure: Secondary | ICD-10-CM | POA: Insufficient documentation

## 2023-04-27 DIAGNOSIS — H548 Legal blindness, as defined in USA: Secondary | ICD-10-CM | POA: Diagnosis not present

## 2023-04-27 DIAGNOSIS — N186 End stage renal disease: Secondary | ICD-10-CM | POA: Insufficient documentation

## 2023-04-27 DIAGNOSIS — E1122 Type 2 diabetes mellitus with diabetic chronic kidney disease: Secondary | ICD-10-CM | POA: Insufficient documentation

## 2023-04-27 DIAGNOSIS — I871 Compression of vein: Secondary | ICD-10-CM | POA: Diagnosis not present

## 2023-04-27 HISTORY — PX: A/V FISTULAGRAM: CATH118298

## 2023-04-27 HISTORY — PX: PERIPHERAL VASCULAR BALLOON ANGIOPLASTY: CATH118281

## 2023-04-27 HISTORY — PX: PERIPHERAL VASCULAR THROMBECTOMY: CATH118306

## 2023-04-27 SURGERY — A/V FISTULAGRAM
Anesthesia: LOCAL

## 2023-04-27 MED ORDER — ONDANSETRON HCL 4 MG/2ML IJ SOLN: 4.0000 mg | Freq: Four times a day (QID) | INTRAMUSCULAR | Status: DC | PRN

## 2023-04-27 MED ORDER — METHYLPREDNISOLONE SODIUM SUCC 125 MG IJ SOLR
INTRAMUSCULAR | Status: DC | PRN
  Administered 2023-04-27: 125 mg via INTRAVENOUS

## 2023-04-27 MED ORDER — SODIUM CHLORIDE 0.9 % IV SOLN: INTRAVENOUS | Status: DC

## 2023-04-27 MED ORDER — DIPHENHYDRAMINE HCL 50 MG/ML IJ SOLN
INTRAMUSCULAR | Status: AC
  Filled 2023-04-27: qty 1

## 2023-04-27 MED ORDER — IODIXANOL 320 MG/ML IV SOLN
INTRAVENOUS | Status: DC | PRN
  Administered 2023-04-27: 10 mL

## 2023-04-27 MED ORDER — FENTANYL CITRATE (PF) 100 MCG/2ML IJ SOLN
INTRAMUSCULAR | Status: AC
  Filled 2023-04-27: qty 2

## 2023-04-27 MED ORDER — HEPARIN (PORCINE) IN NACL 1000-0.9 UT/500ML-% IV SOLN
INTRAVENOUS | Status: DC | PRN
  Administered 2023-04-27: 500 mL

## 2023-04-27 MED ORDER — MIDAZOLAM HCL 2 MG/2ML IJ SOLN
INTRAMUSCULAR | Status: DC | PRN
  Administered 2023-04-27: 1 mg via INTRAVENOUS

## 2023-04-27 MED ORDER — METHYLPREDNISOLONE SODIUM SUCC 125 MG IJ SOLR
INTRAMUSCULAR | Status: AC
  Filled 2023-04-27: qty 2

## 2023-04-27 MED ORDER — SODIUM CHLORIDE 0.9% FLUSH: 3.0000 mL | INTRAVENOUS | Status: DC | PRN

## 2023-04-27 MED ORDER — FENTANYL CITRATE (PF) 100 MCG/2ML IJ SOLN
INTRAMUSCULAR | Status: DC | PRN
  Administered 2023-04-27: 50 ug via INTRAVENOUS

## 2023-04-27 MED ORDER — HEPARIN SODIUM (PORCINE) 1000 UNIT/ML IJ SOLN
INTRAMUSCULAR | Status: AC
  Filled 2023-04-27: qty 10

## 2023-04-27 MED ORDER — LIDOCAINE HCL (PF) 1 % IJ SOLN
INTRAMUSCULAR | Status: AC
  Filled 2023-04-27: qty 30

## 2023-04-27 MED ORDER — MIDAZOLAM HCL 2 MG/2ML IJ SOLN
INTRAMUSCULAR | Status: AC
Start: 2023-04-27 — End: ?
  Filled 2023-04-27: qty 2

## 2023-04-27 MED ORDER — ACETAMINOPHEN 325 MG PO TABS: 650.0000 mg | ORAL_TABLET | ORAL | Status: DC | PRN

## 2023-04-27 MED ORDER — LIDOCAINE HCL (PF) 1 % IJ SOLN
INTRAMUSCULAR | Status: DC | PRN
  Administered 2023-04-27 (×2): 5 mL

## 2023-04-27 MED ORDER — DIPHENHYDRAMINE HCL 50 MG/ML IJ SOLN
INTRAMUSCULAR | Status: DC | PRN
  Administered 2023-04-27: 50 mg via INTRAVENOUS

## 2023-04-27 MED ORDER — HEPARIN SODIUM (PORCINE) 1000 UNIT/ML IJ SOLN
INTRAMUSCULAR | Status: DC | PRN
  Administered 2023-04-27: 5000 [IU] via INTRAVENOUS

## 2023-04-27 SURGICAL SUPPLY — 18 items
BAG SNAP BAND KOVER 36X36 (MISCELLANEOUS) ×2 IMPLANT
BALL ATHLETIS 7.0X80X75 (BALLOONS) ×2
BALLN ATHLETIS 8X80X75 (BALLOONS) ×2
BALLN MUSTANG 7X80X75 (BALLOONS) ×2
BALLOON ATHLETIS 7.0X80X75 (BALLOONS) IMPLANT
BALLOON ATHLETIS 8X80X75 (BALLOONS) IMPLANT
BALLOON FOGARTY 5FR 40 (CATHETERS) IMPLANT
BALLOON MUSTANG 7X80X75 (BALLOONS) IMPLANT
CATH FOGARTY BALL 5FR 40 (CATHETERS) ×2
CATH STR 7FR 55 BRITE (CATHETERS) IMPLANT
COVER DOME SNAP 22 D (MISCELLANEOUS) ×2 IMPLANT
GUIDEWIRE ANGLED .035X150CM (WIRE) IMPLANT
SET INTRODUCER MICROPUNCT 5F (INTRODUCER) IMPLANT
SHEATH PINNACLE R/O II 7F 4CM (SHEATH) IMPLANT
SYR MEDALLION 10ML (SYRINGE) IMPLANT
SYR MEDALLION 3ML (SYRINGE) IMPLANT
TRAY PV CATH (CUSTOM PROCEDURE TRAY) ×2 IMPLANT
WIRE TORQFLEX AUST .018X40CM (WIRE) IMPLANT

## 2023-04-27 NOTE — H&P (Addendum)
Chief Complaint: Decreased flows  Interval H&P  The patient has presented today for an angiogram/ angioplasty.  Various methods of treatment have been discussed with the patient.  After consideration of risk, benefits and other options for treatment, the patient has consented to a angiogram/ angioplasty with  possible stent placement.   Risks of angiogram with potential angioplasty and stenting if needed.contrast reaction, extravasation/ bleeding, dissection, hypotension and death were explained to the patient.  The patient's history has been reviewed and the patient has been examined, no changes in status.  Stable for angiogram/angioplasty  I have reviewed the patient's chart and labs.  Questions were answered to the patient's satisfaction.  Assessment/Plan: ESRD dialyzing at [facility] MWF regimen with last dialysis 1/22 Thrombosed fistula - planning on attempted declot with angioplasty and possible tunneled catheter insertion.  Left brachiobasilic fistula was transposed by Dr. Myra Gianotti on 07/18/22 Renal osteodystrophy - continue binders per home regimen. Anemia - managed with ESA's and IV iron at dialysis center. HTN - resume home regimen.   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  Patient being referred for a thrombosed fistula.  ROS Per HPI.  Chemistry and CBC: Creatinine, Ser  Date/Time Value Ref Range Status  01/15/2023 10:48 AM 8.55 (H) 0.61 - 1.24 mg/dL Final  16/12/9602 54:09 AM 7.08 (H) 0.61 - 1.24 mg/dL Final  81/19/1478 29:56 AM 6.49 (H) 0.61 - 1.24 mg/dL Final  21/30/8657 84:69 AM 6.55 (H) 0.61 - 1.24 mg/dL Final  62/95/2841 32:44 AM 6.57 (H) 0.61 - 1.24 mg/dL Final  04/02/7251 66:44 AM 5.89 (H) 0.61 - 1.24 mg/dL Final  03/47/4259 56:38 AM  6.37 (H) 0.61 - 1.24 mg/dL Final  75/64/3329 51:88 AM 7.30 (H) 0.61 - 1.24 mg/dL Final  41/66/0630 16:01 AM 6.19 (H) 0.61 - 1.24 mg/dL Final  09/32/3557 32:20 PM 5.64 (H) 0.61 - 1.24 mg/dL Final  25/42/7062 37:62 PM 6.08 (H) 0.61 - 1.24 mg/dL Final  83/15/1761 60:73 AM 7.00 (H) 0.61 - 1.24 mg/dL Final  71/08/2692 85:46 AM 5.88 (H) 0.61 - 1.24 mg/dL Final  27/05/5007 38:18 AM 5.43 (H) 0.61 - 1.24 mg/dL Final  29/93/7169 67:89 AM 6.10 (H) 0.61 - 1.24 mg/dL Final  38/12/1749 02:58 AM 5.81 (H) 0.61 - 1.24 mg/dL Final  52/77/8242 35:36 AM 5.27 (H) 0.61 - 1.24 mg/dL Final  14/43/1540 08:67 AM 5.40 (H) 0.61 - 1.24 mg/dL Final  61/95/0932 67:12 AM 5.49 (H) 0.61 - 1.24 mg/dL Final  45/80/9983 38:25 AM 5.32 (H) 0.61 - 1.24 mg/dL Final  05/39/7673 41:93 PM 4.78 (H) 0.61 - 1.24 mg/dL Final  79/05/4095 35:32 PM 4.52 (HH) 0.40 - 1.50 mg/dL Final  99/24/2683 41:96 AM 3.47 (H) 0.40 - 1.50 mg/dL Final  22/29/7989 21:19 PM 3.61 (H) 0.61 - 1.24 mg/dL Final  41/74/0814 48:18 PM 3.97 (H) 0.61 - 1.24 mg/dL Final  56/31/4970 26:37 PM 3.61 (H) 0.76 - 1.27 mg/dL Final  85/88/5027 74:12 PM 2.95 (H) 0.40 - 1.50 mg/dL Final  87/86/7672 09:47 PM 2.22 (H) 0.40 - 1.50 mg/dL Final  09/62/8366 29:47 AM 2.02 (H) 0.40 - 1.50 mg/dL Final  65/46/5035 46:56 PM 2.18 (H) 0.40 - 1.50 mg/dL Final  81/27/5170 01:74 AM 1.72 (H) 0.40 - 1.50 mg/dL Final  94/49/6759 16:38 PM 1.34 0.40 - 1.50 mg/dL Final  46/65/9935 70:17 AM 1.58 (  H) 0.40 - 1.50 mg/dL Final  84/13/2440 10:27 PM 1.57 (H) 0.40 - 1.50 mg/dL Final  25/36/6440 34:74 PM 1.59 (H) 0.40 - 1.50 mg/dL Final  25/95/6387 56:43 PM 1.37 0.40 - 1.50 mg/dL Final  32/95/1884 16:60 AM 1.13 0.76 - 1.27 mg/dL Final  63/03/6008 93:23 AM 1.34 (H) 0.76 - 1.27 mg/dL Final  55/73/2202 54:27 AM 1.02 0.50 - 1.35 mg/dL Final  09/21/7626 31:51 PM 1.13 0.50 - 1.35 mg/dL Final  76/16/0737 10:62 AM 1.00 0.50 - 1.35 mg/dL Final   No results for input(s): "NA", "K", "CL", "CO2", "GLUCOSE",  "BUN", "CREATININE", "CALCIUM", "PHOS" in the last 168 hours.  Invalid input(s): "ALB" No results for input(s): "WBC", "NEUTROABS", "HGB", "HCT", "MCV", "PLT" in the last 168 hours. Liver Function Tests: No results for input(s): "AST", "ALT", "ALKPHOS", "BILITOT", "PROT", "ALBUMIN" in the last 168 hours. No results for input(s): "LIPASE", "AMYLASE" in the last 168 hours. No results for input(s): "AMMONIA" in the last 168 hours. Cardiac Enzymes: No results for input(s): "CKTOTAL", "CKMB", "CKMBINDEX", "TROPONINI" in the last 168 hours. Iron Studies: No results for input(s): "IRON", "TIBC", "TRANSFERRIN", "FERRITIN" in the last 72 hours. PT/INR: @LABRCNTIP (inr:5)  Xrays/Other Studies: )No results found for this or any previous visit (from the past 48 hours). No results found.  PMH:   Past Medical History:  Diagnosis Date   Anemia    Chronic kidney disease    Stage 4   Detached retina    Diabetes mellitus without complication (HCC)    pt states his A1C was has been in normal range for several years now.   H/O hypogonadism    History of kidney stones    passed   History of pituitary tumor    Hypertension    Prolactinoma, benign (HCC)    Vision loss, bilateral     PSH:   Past Surgical History:  Procedure Laterality Date   AV FISTULA PLACEMENT Left 12/25/2021   Procedure: LEFT RADIAL CEPHALIC FISTULA CREATION;  Surgeon: Nada Libman, MD;  Location: [facility] OR;  Service: Vascular;  Laterality: Left;   AV FISTULA PLACEMENT Left 03/26/2022   Procedure: LEFT UPPER EXTREMITY BRACHIOCEPHALIC ARTERIOVENOUS (AV) FISTULA CREATION;  Surgeon: Nada Libman, MD;  Location: [facility] OR;  Service: Vascular;  Laterality: Left;   BASCILIC VEIN TRANSPOSITION Left 07/18/2022   Procedure: LEFT SECOND STAGE BASILIC VEIN TRANSPOSITION;  Surgeon: Nada Libman, MD;  Location: [facility] OR;  Service: Vascular;  Laterality: Left;   COLONOSCOPY     EYE SURGERY     retina surgery - 2018?   KIDNEY STONE  SURGERY  1987    Allergies:  Allergies  Allergen Reactions   Contrast Media [Iodinated Contrast Media] Rash    Medications:   Prior to Admission medications   Medication Sig Start Date End Date Taking? Authorizing Provider  nebivolol (BYSTOLIC) 10 MG tablet Take 10 mg by mouth daily. 04/27/23  Yes [provider]  acetaminophen (TYLENOL) 500 MG tablet Take 500-1,000 mg by mouth every 4 (four) hours as needed for moderate pain.    [provider]  aspirin EC 81 MG tablet Take 81 mg by mouth daily.    [provider]  atropine 1 % ophthalmic solution Place 1 drop into the right eye daily.    [provider]  brimonidine (ALPHAGAN) 0.2 % ophthalmic solution Place 1 drop into both eyes in the morning and at bedtime. 12/07/21   [provider]  cabergoline (DOSTINEX) 0.5 MG tablet Take 1 tablet (  0.5 mg total) by mouth 2 (two) times a week. 04/13/23   Thapa, [place], MD  calcitRIOL (ROCALTROL) 0.25 MCG capsule Take 0.25 mcg by mouth daily. 09/11/21   [provider]  cloNIDine (CATAPRES - DOSED IN MG/24 HR) 0.3 mg/24hr patch Place 0.3 mg onto the skin once a week. Changes on Friday. Apply to left upper back.    [provider]  cloNIDine (CATAPRES) 0.1 MG tablet Take 0.1 mg by mouth 3 (three) times daily as needed (Breakthrough hypertension).    [provider]  dorzolamide-timolol (COSOPT) 2-0.5 % ophthalmic solution Place 1 drop into both eyes 2 (two) times daily. 06/24/21   [provider]  hydrALAZINE (APRESOLINE) 25 MG tablet Take 25 mg by mouth 3 (three) times daily.    [provider]  hydrALAZINE (APRESOLINE) 50 MG tablet Take 50 mg by mouth in the morning and at bedtime. 05/27/22   [provider]  isosorbide mononitrate (IMDUR) 60 MG 24 hr tablet Take 60 mg by mouth daily. AM    [provider]  Multiple Vitamins-Minerals (MULTIVITAL PO) Take 1 tablet by mouth daily.    [provider]  nebivolol (BYSTOLIC) 5 MG tablet Take 5 mg by mouth at bedtime. 02/12/22   [provider]  NIFEdipine (ADALAT CC) 30 MG 24 hr tablet Take 30 mg by mouth daily. AM    [provider]  olmesartan (BENICAR) 20 MG tablet Take 20 mg by mouth at bedtime. 03/09/23   [provider]  oxyCODONE-acetaminophen (PERCOCET) 5-325 MG tablet Take 1 tablet by mouth every 6 (six) hours as needed for severe pain. 07/18/22   Emilie Rutter, PA-C  timolol (BETIMOL) 0.25 % ophthalmic solution Place 1-2 drops into both eyes 2 (two) times daily.    [provider]  torsemide (DEMADEX) 100 MG tablet Take 100 mg by mouth every morning. 02/11/23   [provider]  VITAMIN A PO Take 1 tablet by mouth daily.    [provider]  Vitamin D, Ergocalciferol, (DRISDOL) 1.25 MG (50000 UNIT) CAPS capsule Take 1 capsule (50,000 Units total) by mouth every 7 (seven) days. 01/23/23   Thapa, [place], MD    Discontinued Meds:   Medications Discontinued During This Encounter  Medication Reason   0.9 %  sodium chloride infusion Completed Course    Social History:  reports that he has never smoked. He has never been exposed to tobacco smoke. He has never used smokeless tobacco. He reports that he does not drink alcohol and does not use drugs.  Family History:   Family History  Problem Relation Age of Onset   Diabetes Mother    Colon polyps Mother    Heart disease Mother    Hypertension Mother    Colon cancer Father    Kidney disease Maternal Grandmother    Colon cancer Maternal Grandfather    Esophageal cancer Maternal Uncle    Colon cancer Maternal Uncle     Blood pressure (!) 165/93, pulse 64, resp. rate 14, height 5\' 11"  (1.803 m), weight 88.5 kg, SpO2 99%. GEN: NAD, A&Ox3, NCAT HEENT: No conjunctival pallor, EOMI, blind NECK: Supple, no thyromegaly LUNGS: CTA B/L no rales, rhonchi or wheezing CV: RRR, No M/R/G ABD: SNDNT +BS  EXT: No lower  extremity edema ACCESS: lt BBT thrombosed        Nayara Taplin, Len Blalock, MD 04/27/2023, 12:39 PM

## 2023-04-27 NOTE — Op Note (Signed)
Patient referred for a thrombectomy of his left upper BBT AVF TRANSPOSED July 18, 2022 BY DR. Myra Gianotti.   On examination there is no pulse or bruit in the left upper arm brachiobasilic fistula. The left radial pulse is 1+.  Patient has a history of a contrast reaction of only a rash; patient denies any lip, tongue swelling or respiratory compromise.  Summary:  1) Successful thrombectomy of a left upper arm BBT AVF with evidence of 90% outflow basilic vein swing segment stenosis which was corrected with angioplasty (7X8 Athletis -> 8x8 Athletis  FE ~25 ATM). 2) 50% arterial anastomosis stenosis treated with a 6x4 Mustang; 50-70% mid basilic vein stenosis treated with a 8 x 8  Athletis FE ~18 ATM. 3) Patent left sided central veins. 4) This left upper arm brachiobasilic fistula remains amenable to future percutaneous intervention.  Description of procedure: The left arm was prepped and draped in the usual fashion. The left upper arm arc AVG was first cannulated (29562) in the arterial limb in an antegrade direction with a 21-gauge micropuncture needle, exchanged over for a 5 French stylette and then a 7Fr sheath was inserted by guidewire exchange technique. A 0.035 guidewire was passed through the clotted graft into the central veins under fluoroscopic guidance and then a 5Fr diagnostic catheter inserted over the wire. The wire was removed, and then intravenous heparin and sedative drugs administered. A pullback angiogram was performed by injecting contrast ([id number]) via the diagnostic catheter. This showed patent central veins, patent left axillary vein, 90% outflow proximal swing segment basilic vein stenosis and evidence of filling defects in the fistula consistent with thrombus.  Two passes were made with the 7Fr [facility] 1 catheter from the axillary vein to the antegrade sheath to physically remove thrombus and perform the thrombectomy (57846); there was actually very minimal thrombus aspirated as a lot of  the thrombus was adherent to the wall of the basilic vein. The catheter was removed, and a 7x8 ->  8 x 8 Athletis  angioplasty balloon were inserted over the wire to the level of the outflow basilic vein lesion followed by venous angioplasty (96295) carried out to 25 ATM at only the site of stenosis to FULL effacement and then more gently in the rest of the fistula circuit to macerate the thrombus.    In order to remove this clot and remove the platelet plug at the arterial anastomosis, a second cannulation (28413) was required in a retrograde direction.  A 21-gauge micropuncture needle was used to gain access to the retrograde direction, exchanged out for a 5 French stylette and then a 7Fr sheath was inserted by guidewire exchange technique. A 4Fr Fogarty catheter was inserted through the sheath and advanced into the proximal brachial artery. The over the wire embolectomy balloon was inflated with 0.6cc of contrast and then pulled across the arterial anastomosis into the fistula with aspiration of the sheath via the side port to remove the platelet plug and thrombus. [phone]). This step was repeated to sweep all residual clot from this side of the graft. In order to check the arterial inflow an arteriogram was necessary because refluxing contrast from the graft would have caused risk of embolizing thrombus from the graft into the arterial vasculature. A glidewire and straight catheter were inserted into the proximal brachial artery and an arteriogram performed by parking the tip of the catheter 2cm proximal to the arterial anastomosis. The arteriogram documented a good calibered proximal and distal brachial artery with slow distal runoff  and no evidence of distal emboli.   Of note there was very little inflow with evidence of 50 to 70% arterial anastomosis and inflow limb of the basilic vein stenosis.  I then advanced a 6 mm x 4 cm Mustang balloon over the guidewire and treated the inflow basilic vein and  arterial anastomosis to full balloon effacement at approximately 10 to 14 atm of pressure.  Finally after treating the outflow with a 8 x 8 Athletics balloon and in dot to dot fashion with gentle polishing there was flow established.  Final arteriogram revealed 10% residual stenosis at all levels, brisk flow, no evidence of extravasation or dissection.  Very minimal residual thrombus noted in the main body of the fistula which is markedly improved only after 8 x 8 balloon angioplasty was performed.  Follow up angiogram showed good blood flow through the fistula circuit with very minimal retained thrombus within the body of the cannulation zone.  Flows were very rapid; there was a good thrill and bruit.  Hemostasis: A 3-0 ethilon purse string suture was placed at the cannulation site on removal of the sheath.  Sedation: 1 mg Versed, Fentanyl. Sedation time. 39  minutes  Contrast. 10 mL  Monitoring: Because of the patient's comorbid conditions and sedation during the procedure, continuous EKG monitoring and O2 saturation monitoring was performed throughout the procedure by the RN. There were no abnormal arrhythmias encountered.  Complications: None.   Diagnoses: N18.6 End stage renal disease  I87.1 Stricture of basilic vein T82.868A Thrombosis of vascular prosthetic device/graft, initial  Procedure Coding:  [phone] Cannulation and angiogram of fistula/ graft, thrombectomy and venous angioplasty G9562 Contrast  Recommendations:  1. Continue to cannulate the graft with 15G needles.  2. Refer back for problems with flows. 3. Remove the sutures next treatment.   Discharge: The patient was discharged home in stable condition. The patient was given education regarding the care of the dialysis access AVF and specific instructions in case of any problems.

## 2023-04-27 NOTE — Discharge Instructions (Signed)

## 2023-04-28 ENCOUNTER — Encounter (HOSPITAL_COMMUNITY): Payer: Self-pay | Admitting: Nephrology

## 2023-04-29 DIAGNOSIS — N186 End stage renal disease: Secondary | ICD-10-CM | POA: Diagnosis not present

## 2023-04-29 DIAGNOSIS — N2581 Secondary hyperparathyroidism of renal origin: Secondary | ICD-10-CM | POA: Diagnosis not present

## 2023-04-29 DIAGNOSIS — Z992 Dependence on renal dialysis: Secondary | ICD-10-CM | POA: Diagnosis not present

## 2023-04-30 DIAGNOSIS — N184 Chronic kidney disease, stage 4 (severe): Secondary | ICD-10-CM | POA: Diagnosis not present

## 2023-04-30 DIAGNOSIS — Z01818 Encounter for other preprocedural examination: Secondary | ICD-10-CM | POA: Diagnosis not present

## 2023-05-01 DIAGNOSIS — E1121 Type 2 diabetes mellitus with diabetic nephropathy: Secondary | ICD-10-CM | POA: Diagnosis not present

## 2023-05-01 DIAGNOSIS — Z992 Dependence on renal dialysis: Secondary | ICD-10-CM | POA: Diagnosis not present

## 2023-05-01 DIAGNOSIS — N2581 Secondary hyperparathyroidism of renal origin: Secondary | ICD-10-CM | POA: Diagnosis not present

## 2023-05-01 DIAGNOSIS — N186 End stage renal disease: Secondary | ICD-10-CM | POA: Diagnosis not present

## 2023-05-04 DIAGNOSIS — Z992 Dependence on renal dialysis: Secondary | ICD-10-CM | POA: Diagnosis not present

## 2023-05-04 DIAGNOSIS — N186 End stage renal disease: Secondary | ICD-10-CM | POA: Diagnosis not present

## 2023-05-04 DIAGNOSIS — N2581 Secondary hyperparathyroidism of renal origin: Secondary | ICD-10-CM | POA: Diagnosis not present

## 2023-05-06 DIAGNOSIS — Z992 Dependence on renal dialysis: Secondary | ICD-10-CM | POA: Diagnosis not present

## 2023-05-06 DIAGNOSIS — N2581 Secondary hyperparathyroidism of renal origin: Secondary | ICD-10-CM | POA: Diagnosis not present

## 2023-05-06 DIAGNOSIS — N186 End stage renal disease: Secondary | ICD-10-CM | POA: Diagnosis not present

## 2023-05-08 DIAGNOSIS — N186 End stage renal disease: Secondary | ICD-10-CM | POA: Diagnosis not present

## 2023-05-08 DIAGNOSIS — Z992 Dependence on renal dialysis: Secondary | ICD-10-CM | POA: Diagnosis not present

## 2023-05-08 DIAGNOSIS — N2581 Secondary hyperparathyroidism of renal origin: Secondary | ICD-10-CM | POA: Diagnosis not present

## 2023-05-11 DIAGNOSIS — N186 End stage renal disease: Secondary | ICD-10-CM | POA: Diagnosis not present

## 2023-05-11 DIAGNOSIS — Z992 Dependence on renal dialysis: Secondary | ICD-10-CM | POA: Diagnosis not present

## 2023-05-11 DIAGNOSIS — N2581 Secondary hyperparathyroidism of renal origin: Secondary | ICD-10-CM | POA: Diagnosis not present

## 2023-05-13 DIAGNOSIS — Z992 Dependence on renal dialysis: Secondary | ICD-10-CM | POA: Diagnosis not present

## 2023-05-13 DIAGNOSIS — N186 End stage renal disease: Secondary | ICD-10-CM | POA: Diagnosis not present

## 2023-05-13 DIAGNOSIS — Z01818 Encounter for other preprocedural examination: Secondary | ICD-10-CM | POA: Diagnosis not present

## 2023-05-13 DIAGNOSIS — N2581 Secondary hyperparathyroidism of renal origin: Secondary | ICD-10-CM | POA: Diagnosis not present

## 2023-05-15 DIAGNOSIS — Z992 Dependence on renal dialysis: Secondary | ICD-10-CM | POA: Diagnosis not present

## 2023-05-15 DIAGNOSIS — N2581 Secondary hyperparathyroidism of renal origin: Secondary | ICD-10-CM | POA: Diagnosis not present

## 2023-05-15 DIAGNOSIS — N186 End stage renal disease: Secondary | ICD-10-CM | POA: Diagnosis not present

## 2023-05-25 ENCOUNTER — Emergency Department (HOSPITAL_COMMUNITY)
Admission: EM | Admit: 2023-05-25 | Discharge: 2023-05-25 | Discharge status: Against Medical Advice | Payer: BC Managed Care – PPO | Attending: Emergency Medicine | Admitting: Emergency Medicine

## 2023-05-25 DIAGNOSIS — R0602 Shortness of breath: Secondary | ICD-10-CM | POA: Diagnosis not present

## 2023-05-25 DIAGNOSIS — Z5321 Procedure and treatment not carried out due to patient leaving prior to being seen by health care provider: Secondary | ICD-10-CM | POA: Diagnosis not present

## 2023-05-25 NOTE — ED Notes (Addendum)
 Pt brought POV from dialysis. Pt did not complete a full dialysis session due to being SOB. Pt registered and vital signs were assessed. I advised pt his o2 sats were 98% on RA. This patient advised he was going to leave AMA after seeing his oxygen was OK. This EMT strongly encouraged the pt to stay and be seen. OTF

## 2023-05-28 ENCOUNTER — Other Ambulatory Visit: Payer: Self-pay

## 2023-05-29 DIAGNOSIS — N186 End stage renal disease: Secondary | ICD-10-CM | POA: Diagnosis not present

## 2023-05-29 DIAGNOSIS — Z992 Dependence on renal dialysis: Secondary | ICD-10-CM | POA: Diagnosis not present

## 2023-05-29 DIAGNOSIS — E1121 Type 2 diabetes mellitus with diabetic nephropathy: Secondary | ICD-10-CM | POA: Diagnosis not present

## 2023-06-04 ENCOUNTER — Other Ambulatory Visit: Payer: BC Managed Care – PPO

## 2023-06-05 ENCOUNTER — Telehealth: Payer: Self-pay

## 2023-06-05 DIAGNOSIS — D352 Benign neoplasm of pituitary gland: Secondary | ICD-10-CM

## 2023-06-05 LAB — PROLACTIN: Prolactin: 117.3 ng/mL — ABNORMAL HIGH (ref 2.0–18.0)

## 2023-06-05 MED ORDER — CABERGOLINE 0.5 MG PO TABS: ORAL_TABLET | ORAL | 3 refills | Status: DC

## 2023-06-05 NOTE — Telephone Encounter (Signed)
-----   Message from [place] Thapa sent at 06/05/2023  8:07 AM EST ----- Please notify patient of labs reviewed today still has significantly high prolactin.  Please check with the patient the dose of cabergoline he has been currently taking, I had increased the dose in January supposed to be taking 0.5 mg 2 times a week.  I would like to have sooner visit in couple of weeks, please reschedule.

## 2023-06-05 NOTE — Telephone Encounter (Signed)
 Patient given results and medication changes as directed by MD. Patient transferred to front desk to schedule an earlier appointment.

## 2023-06-05 NOTE — Telephone Encounter (Signed)
 Spoke to patient regarding MD recommendations. No further questions from patient at this time, patient transferred to front desk to change lab schedule appt to accommodate for the 6 weeks requested from MD.

## 2023-06-05 NOTE — Addendum Note (Signed)
 Addended by: Oberon Hehir, [place] on: 06/05/2023 10:26 AM   Modules accepted: Orders

## 2023-06-05 NOTE — Telephone Encounter (Signed)
 Prolactin level has improved slightly after increasing the dose, however prolactin level is still high, cabergoline may have been dialyzing.  My recommendation.  I would like to increase the dose of cabergoline 0.5 mg 1 tablet 3 times a week, I would recommend to take right after dialysis on Monday, Wednesday and Friday.  Check prolactin in 6 weeks.  I have placed order.  Please arrange for lab.  No other appointment is required at this time.  We can keep the appointment as planned in July.  [place] Elliotte Marsalis, MD [facility] Endocrinology [facility] [address] [place], [address] [place], [place] [address] Phone # [phone]

## 2023-07-09 ENCOUNTER — Ambulatory Visit: Admitting: Endocrinology

## 2023-07-21 ENCOUNTER — Other Ambulatory Visit

## 2023-07-21 ENCOUNTER — Encounter (HOSPITAL_COMMUNITY): Payer: Self-pay

## 2023-07-22 ENCOUNTER — Telehealth: Payer: Self-pay

## 2023-07-22 ENCOUNTER — Other Ambulatory Visit: Payer: Self-pay | Admitting: Endocrinology

## 2023-07-22 DIAGNOSIS — D352 Benign neoplasm of pituitary gland: Secondary | ICD-10-CM

## 2023-07-22 LAB — PROLACTIN: Prolactin: 57.4 ng/mL — ABNORMAL HIGH (ref 2.0–18.0)

## 2023-07-22 MED ORDER — CABERGOLINE 0.5 MG PO TABS: ORAL_TABLET | ORAL | 3 refills | Status: DC

## 2023-07-22 NOTE — Telephone Encounter (Signed)
-----   Message from [place] Thapa sent at 07/22/2023  8:39 AM EDT ----- Please notify patient of prolactin level is improving however is still high.  I would like to increase cabergoline  from 0.5 mg 1 tablet 3 times a week to 4 times a week.  Take cabergoline  after dialysis on the dialysis day.  Will check prolactin at the time of follow-up visit in July.

## 2023-07-22 NOTE — Telephone Encounter (Signed)
 Patient given results and medication changes as directed by MD. No further questions at this time.

## 2023-08-20 ENCOUNTER — Encounter (HOSPITAL_COMMUNITY): Payer: Self-pay

## 2023-08-25 ENCOUNTER — Ambulatory Visit: Admitting: Podiatry

## 2023-09-03 ENCOUNTER — Other Ambulatory Visit: Payer: Self-pay

## 2023-09-03 DIAGNOSIS — D352 Benign neoplasm of pituitary gland: Secondary | ICD-10-CM

## 2023-09-03 MED ORDER — CABERGOLINE 0.5 MG PO TABS
ORAL_TABLET | ORAL | 3 refills | Status: DC
Start: 2023-09-03 — End: 2023-11-25

## 2023-09-07 ENCOUNTER — Other Ambulatory Visit (HOSPITAL_COMMUNITY): Payer: Self-pay

## 2023-09-09 ENCOUNTER — Encounter: Payer: Self-pay | Admitting: Physician Assistant

## 2023-09-17 ENCOUNTER — Ambulatory Visit: Admitting: Podiatry

## 2023-10-11 NOTE — Progress Notes (Deleted)
 Ellouise Console, PA-C [address] [place], [place]  [address] Phone: [phone]   Gastroenterology Consultation  Referring Provider:     No ref. provider found Primary Care Physician:  Pcp, No Primary Gastroenterologist:  Ellouise Console, PA-C / Elspeth Naval, Roberto [name]  Reason for Consultation:     Discuss colonoscopy prior to kidney transplant        HPI:   Roberto Carliss [name], Roberto [name] is a 63 y.o. y/o male referred for consultation & management  by Pcp, No.  Here to us  to discuss scheduling colonoscopy prior to kidney transplant.  10/2016 last colonoscopy by Dr. Naval: 1 small 3 mm tubular adenoma polyp removed from transverse colon.  Small internal hemorrhoids.  Adequate prep after several minutes of lavaging the colon.  7-year repeat (due 10/2023).  PMH: ESRD on dialysis, hypertension, diabetes, diabetic nephropathy, hyperprolactinemia, pituitary adenoma, normocytic anemia, vitamin D  deficiency.  Past Medical History:  Diagnosis Date   Anemia    Chronic kidney disease    Stage 4   Detached retina    Diabetes mellitus without complication (HCC)    pt states his A1C was has been in normal range for several years now.   H/O hypogonadism    History of kidney stones    passed   History of pituitary tumor    Hypertension    Prolactinoma, benign (HCC)    Vision loss, bilateral     Past Surgical History:  Procedure Laterality Date   A/V FISTULAGRAM N/A 04/27/2023   Procedure: A/V Fistulagram;  Surgeon: Melia Lynwood ORN, Roberto [name];  [facility]: [facility] INVASIVE CV LAB;  Service: Cardiovascular;  Laterality: N/A;   AV FISTULA PLACEMENT Left 12/25/2021   Procedure: LEFT RADIAL CEPHALIC FISTULA CREATION;  Surgeon: Serene Gaile ORN, Roberto [name];  Location: [facility] OR;  Service: Vascular;  Laterality: Left;   AV FISTULA PLACEMENT Left 03/26/2022   Procedure: LEFT UPPER EXTREMITY BRACHIOCEPHALIC ARTERIOVENOUS (AV) FISTULA CREATION;  Surgeon: Serene Gaile ORN, Roberto [name];  Location: [facility] OR;  Service: Vascular;   Laterality: Left;   BASCILIC VEIN TRANSPOSITION Left 07/18/2022   Procedure: LEFT SECOND STAGE BASILIC VEIN TRANSPOSITION;  Surgeon: Serene Gaile ORN, Roberto [name];  Location: [facility] OR;  Service: Vascular;  Laterality: Left;   COLONOSCOPY     EYE SURGERY     retina surgery - 2018?   KIDNEY STONE SURGERY  1987   PERIPHERAL VASCULAR BALLOON ANGIOPLASTY Left 04/27/2023   Procedure: PERIPHERAL VASCULAR BALLOON ANGIOPLASTY;  Surgeon: Melia Lynwood ORN, Roberto [name];  Location: [facility] INVASIVE CV LAB;  Service: Cardiovascular;  Laterality: Left;  80% outflow swing site   PERIPHERAL VASCULAR THROMBECTOMY Left 04/27/2023   Procedure: PERIPHERAL VASCULAR THROMBECTOMY;  Surgeon: Melia Lynwood ORN, Roberto [name];  Location: [facility] INVASIVE CV LAB;  Service: Cardiovascular;  Laterality: Left;  80% outflow swing site    Prior to Admission medications   Medication Sig Start Date End Date Taking? Authorizing Provider  acetaminophen  (TYLENOL ) 500 MG tablet Take 500-1,000 mg by mouth every 4 (four) hours as needed for moderate pain.    Provider, Historical, Roberto [name]  aspirin  EC 81 MG tablet Take 81 mg by mouth daily.    Provider, Historical, Roberto [name]  atropine 1 % ophthalmic solution Place 1 drop into the right eye daily.    Provider, Historical, Roberto [name]  brimonidine (ALPHAGAN) 0.2 % ophthalmic solution Place 1 drop into both eyes in the morning and at bedtime. 12/07/21   Provider, Historical, Roberto [name]  cabergoline  (DOSTINEX ) 0.5 MG tablet Take 1 tablet 4 times a  week on Monday, Wednesday and Friday right after dialysis and Saturdays. 09/03/23   Thapa, [place], Roberto [name]  calcitRIOL (ROCALTROL) 0.25 MCG capsule Take 0.25 mcg by mouth daily. 09/11/21   Provider, Historical, Roberto [name]  cloNIDine  (CATAPRES  - DOSED IN MG/24 HR) 0.3 mg/24hr patch Place 0.3 mg onto the skin once a week. Changes on Friday. Apply to left upper back.    Provider, Historical, Roberto [name]  cloNIDine  (CATAPRES ) 0.1 MG tablet Take 0.1 mg by mouth 3 (three) times daily as needed (Breakthrough hypertension).    Provider, Historical, Roberto [name]   dorzolamide-timolol (COSOPT) 2-0.5 % ophthalmic solution Place 1 drop into both eyes 2 (two) times daily. 06/24/21   Provider, Historical, Roberto [name]  hydrALAZINE  (APRESOLINE ) 25 MG tablet Take 25 mg by mouth 3 (three) times daily.    Provider, Historical, Roberto [name]  hydrALAZINE  (APRESOLINE ) 50 MG tablet Take 50 mg by mouth in the morning and at bedtime. 05/27/22   Provider, Historical, Roberto [name]  isosorbide  mononitrate (IMDUR ) 60 MG 24 hr tablet Take 60 mg by mouth daily. AM    Provider, Historical, Roberto [name]  Multiple Vitamins-Minerals (MULTIVITAL PO) Take 1 tablet by mouth daily.    Provider, Historical, Roberto [name]  nebivolol (BYSTOLIC) 10 MG tablet Take 10 mg by mouth daily. 04/27/23   Provider, Historical, Roberto [name]  nebivolol (BYSTOLIC) 5 MG tablet Take 5 mg by mouth at bedtime. 02/12/22   Provider, Historical, Roberto [name]  NIFEdipine (ADALAT CC) 30 MG 24 hr tablet Take 30 mg by mouth daily. AM    Provider, Historical, Roberto [name]  olmesartan (BENICAR) 20 MG tablet Take 20 mg by mouth at bedtime. 03/09/23   Provider, Historical, Roberto [name]  oxyCODONE -acetaminophen  (PERCOCET) 5-325 MG tablet Take 1 tablet by mouth every 6 (six) hours as needed for severe pain. 07/18/22   Bethanie Cough, PA-C  timolol (BETIMOL) 0.25 % ophthalmic solution Place 1-2 drops into both eyes 2 (two) times daily.    Provider, Historical, Roberto [name]  torsemide (DEMADEX) 100 MG tablet Take 100 mg by mouth every morning. 02/11/23   Provider, Historical, Roberto [name]  VITAMIN A PO Take 1 tablet by mouth daily.    Provider, Historical, Roberto [name]  Vitamin D , Ergocalciferol , (DRISDOL ) 1.25 MG (50000 UNIT) CAPS capsule Take 1 capsule (50,000 Units total) by mouth every 7 (seven) days. 01/23/23   Thapa, [place], Roberto [name]    Family History  Problem Relation Age of Onset   Diabetes Mother    Colon polyps Mother    Heart disease Mother    Hypertension Mother    Colon cancer Father    Kidney disease Maternal Grandmother    Colon cancer Maternal Grandfather    Esophageal cancer Maternal Uncle    Colon cancer Maternal Uncle       Social History   Tobacco Use   Smoking status: Never    Passive exposure: Never   Smokeless tobacco: Never  Vaping Use   Vaping status: Never Used  Substance Use Topics   Alcohol use: No   Drug use: No    Allergies as of 10/12/2023 - Review Complete 04/27/2023  Allergen Reaction Noted   Contrast media [iodinated contrast media] Rash 03/20/2019    Review of Systems:    All systems reviewed and negative except where noted in HPI.   Physical Exam:  There were no vitals taken for this visit. No LMP for male patient.  General:   Alert,  Well-developed, well-nourished, pleasant and cooperative in NAD Lungs:  Respirations even and unlabored.  Clear throughout to auscultation.   No wheezes, crackles, or  rhonchi. No acute distress. Heart:  Regular rate and rhythm; no murmurs, clicks, rubs, or gallops. Abdomen:  Normal bowel sounds.  No bruits.  Soft, and non-distended without masses, hepatosplenomegaly or hernias noted.  No Tenderness.  No guarding or rebound tenderness.    Neurologic:  Alert and oriented x3;  grossly normal neurologically. Psych:  Alert and cooperative. Normal mood and affect.  Imaging Studies: No results found.  Labs: CBC    Component Value Date/Time   WBC 3.9 (L) 05/28/2020 1131   RBC 3.09 (L) 05/28/2020 1131   HGB 9.0 (L) 01/15/2023 1052   HCT 31.0 (L) 07/18/2022 0630   PLT 147.0 (L) 05/28/2020 1131   MCV 90.6 05/28/2020 1131   MCH 29.8 03/26/2019 2128   MCHC 34.1 05/28/2020 1131   RDW 12.8 05/28/2020 1131   LYMPHSABS 1.1 03/26/2019 2128   MONOABS 0.5 03/26/2019 2128   EOSABS 0.2 03/26/2019 2128   BASOSABS 0.0 03/26/2019 2128    CMP     Component Value Date/Time   NA 140 01/15/2023 1048   NA 140 03/18/2019 1625   K 4.3 01/15/2023 1048   CL 115 (H) 01/15/2023 1048   CO2 15 (L) 01/15/2023 1048   GLUCOSE 104 (H) 01/15/2023 1048   BUN 96 (H) 01/15/2023 1048   BUN 40 (H) 03/18/2019 1625   CREATININE 8.55 (H) 01/15/2023 1048   CALCIUM 8.0  (L) 01/15/2023 1048   CALCIUM 8.6 12/18/2022 1132   PROT 6.3 04/12/2021 1350   PROT 6.9 03/18/2019 1625   ALBUMIN 3.3 (L) 01/15/2023 1048   ALBUMIN 3.9 03/18/2019 1625   AST 13 04/12/2021 1350   ALT 11 04/12/2021 1350   ALKPHOS 73 04/12/2021 1350   BILITOT 0.3 04/12/2021 1350   BILITOT 0.4 03/18/2019 1625   GFRNONAA 6 (L) 01/15/2023 1048   GFRAA 20 (L) 03/26/2019 2128    Assessment and Plan:   Roberto Carliss [name], Roberto [name] is a 63 y.o. y/o male has been referred for   1.  History of adenomatous colon polyp - Scheduling Colonoscopy I discussed risks of colonoscopy with patient to include risk of bleeding, colon perforation, and risk of sedation.  Patient expressed understanding and agrees to proceed with colonoscopy.   2.  Comorbidities:  ESRD on dialysis, hypertension, diabetes, diabetic nephropathy, hyperprolactinemia, pituitary adenoma, normocytic anemia, vitamin D  deficiency. - Scheduling colonoscopy in hospital - Labs 1 week prior to colonoscopy procedure  Follow up ***  Ellouise Console, PA-C

## 2023-10-12 ENCOUNTER — Ambulatory Visit: Admitting: Physician Assistant

## 2023-10-16 ENCOUNTER — Ambulatory Visit: Admitting: Nurse Practitioner

## 2023-10-22 ENCOUNTER — Ambulatory Visit: Admitting: Physician Assistant

## 2023-10-27 ENCOUNTER — Ambulatory Visit: Payer: BC Managed Care – PPO | Admitting: Endocrinology

## 2023-10-29 ENCOUNTER — Ambulatory Visit: Admitting: Endocrinology

## 2023-11-18 ENCOUNTER — Other Ambulatory Visit: Payer: Self-pay

## 2023-11-19 ENCOUNTER — Encounter: Payer: Self-pay | Admitting: Endocrinology

## 2023-11-19 ENCOUNTER — Ambulatory Visit: Payer: Self-pay | Admitting: Endocrinology

## 2023-11-19 ENCOUNTER — Ambulatory Visit (INDEPENDENT_AMBULATORY_CARE_PROVIDER_SITE_OTHER): Admitting: Endocrinology

## 2023-11-19 VITALS — BP 118/72 | HR 75 | Resp 20 | Ht 71.0 in | Wt 196.5 lb

## 2023-11-19 DIAGNOSIS — E221 Hyperprolactinemia: Secondary | ICD-10-CM

## 2023-11-19 DIAGNOSIS — D352 Benign neoplasm of pituitary gland: Secondary | ICD-10-CM | POA: Diagnosis not present

## 2023-11-19 DIAGNOSIS — E119 Type 2 diabetes mellitus without complications: Secondary | ICD-10-CM

## 2023-11-19 DIAGNOSIS — R7989 Other specified abnormal findings of blood chemistry: Secondary | ICD-10-CM | POA: Diagnosis not present

## 2023-11-19 LAB — POCT GLYCOSYLATED HEMOGLOBIN (HGB A1C): Hemoglobin A1C: 5.2 % (ref 4.0–5.6)

## 2023-11-19 NOTE — Progress Notes (Signed)
 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   Pertinent History: Patient was previously seen by Dr. Von and was last time seen in June 2024.  # Macroprolactinoma / hyperprolactinemia - Patient had MRI on 07/18/14 showed a pituitary tumor with the following description: 2.3 x 2 x 1.8 cm mass centered in the sella with suprasellar extension and cavernous sinus extension greater on the left with an appearance most suggestive of pituitary macroadenoma. This impresses upon the optic chiasm. Had difficulty with peripheral vision on the left side but this resolved.  Visual difficulties currently have been unrelated to the pituitary tumor. Eyes: He has multiple problems including vitreous hemorrhage, glaucoma with markedly decreased vision bilaterally.    MRI in 2/17 showing focal area of delayed enhancement this represents a pituitary adenoma on the left.  This lesion measures 15 x 12 x 11 mm.    Has had a follow-up MRI in 10/2019 which showed the following: decreased size of mass along the left aspect of the sella turcica. Residual soft tissue along the sellar floor measures approximately 11 x 5 mm. No mass effect on the optic chiasm.  - Hyperprolactinemia:  At baseline he had a prolactin level checked and this was markedly increased at 1312. He had symptoms of decreased libido and gynecomastia. Testosterone  level at baseline was low at 70.  - He  was started on Dostinex  0.5 mg initially half tablet twice a week and then 1 tablet twice a week on his initial consultation on 08/04/14. He has been taking this regularly without any side  effects like nausea   He is continuing to be taking half tablet of Dostinex  alternating with 1 tablet a week on Tuesdays and Fridays.  After he was on hemodialysis for ESRD, prolactin level was significantly elevated to 131,cabergoline  has been gradually increased to 0.5 mg 1 tablet 4 times a week.  He has been taking cabergoline  after dialysis.   Latest Reference Range & Units 09/02/22 11:12 04/09/23 10:01 06/04/23 14:33 07/21/23 12:07  Prolactin 2.0 - 18.0 ng/mL 16.8 131.0 (H) 117.3 (H) 57.4 (H)  (H): Data is abnormally high   He had transiently low testosterone  after initial diagnosis of large prolactinoma and has not been on treatments previously.  Mostly total testosterone  were normal in the past.  # Type 2 diabetes mellitus controlled - He was diagnosed  in 2014  and has been previously managed by a primary care physician who he works with. On admission to the hospital in 2016 his glucose was 380 without any evidence of ketosis. Highest A1c had been 8% in 2018, subsequently in the normal range since 11/18, lately mostly in 5 range.  However he has ESRD and anemia as well.  He has not been on any antidiabetic medication.  He has been on diet control and exercise.  He has previously seen dietitian.  He has been checking blood sugar sporadically.  Blood sugar has been in acceptable range no concerning hyperglycemia.  Other: ESRD following with nephrology on hemodialysis, waiting for renal transplant.  Interval history Patient has been taking cabergoline  1 tablet 4 times a  week after dialysis.  Prolactin was 57 which was improved gradually 131, down to 117 and down to 57.  Prolactin was 57 in April 2025 and cabergoline  was increased from 3 to 4 tablets a week.  Patient reports she has less breast tenderness.  No galactorrhea.  No new headache.  No change in vision.  Hemoglobin A1c today 5.2%.  He is on a fentanyl  transplant list.  Patient reported plan for colonoscopy in November.  No  other complaints today.  He had low level of testosterone  in January, 2025 likely related to elevated prolactin.   REVIEW OF SYSTEMS:  As per history of present illness.   PAST MEDICAL HISTORY: Past Medical History:  Diagnosis Date   Anemia    Chronic kidney disease    Stage 4   Detached retina    Diabetes mellitus without complication (HCC)    pt states his A1C was has been in normal range for several years now.   H/O hypogonadism    History of kidney stones    passed   History of pituitary tumor    Hypertension    Prolactinoma, benign (HCC)    Vision loss, bilateral     PAST SURGICAL HISTORY: Past Surgical History:  Procedure Laterality Date   A/V FISTULAGRAM N/A 04/27/2023   Procedure: A/V Fistulagram;  Surgeon: Melia Lynwood ORN, MD;  Location: [facility] INVASIVE CV LAB;  Service: Cardiovascular;  Laterality: N/A;   AV FISTULA PLACEMENT Left 12/25/2021   Procedure: LEFT RADIAL CEPHALIC FISTULA CREATION;  Surgeon: Serene Gaile ORN, MD;  Location: [facility] OR;  Service: Vascular;  Laterality: Left;   AV FISTULA PLACEMENT Left 03/26/2022   Procedure: LEFT UPPER EXTREMITY BRACHIOCEPHALIC ARTERIOVENOUS (AV) FISTULA CREATION;  Surgeon: Serene Gaile ORN, MD;  Location: [facility] OR;  Service: Vascular;  Laterality: Left;   BASCILIC VEIN TRANSPOSITION Left 07/18/2022   Procedure: LEFT SECOND STAGE BASILIC VEIN TRANSPOSITION;  Surgeon: Serene Gaile ORN, MD;  Location: [facility] OR;  Service: Vascular;  Laterality: Left;   COLONOSCOPY     EYE SURGERY     retina surgery - 2018?   KIDNEY STONE SURGERY  1987   PERIPHERAL VASCULAR BALLOON ANGIOPLASTY Left 04/27/2023   Procedure: PERIPHERAL VASCULAR BALLOON ANGIOPLASTY;  Surgeon: Melia Lynwood ORN, MD;  Location: [facility] INVASIVE CV LAB;  Service: Cardiovascular;  Laterality: Left;  80% outflow swing site   PERIPHERAL VASCULAR THROMBECTOMY Left 04/27/2023   Procedure: PERIPHERAL VASCULAR THROMBECTOMY;  Surgeon: Melia Lynwood ORN, MD;  Location: [facility] INVASIVE CV LAB;  Service:  Cardiovascular;  Laterality: Left;  80% outflow swing site    ALLERGIES: Allergies  Allergen Reactions   Contrast Media [Iodinated Contrast Media] Rash    FAMILY HISTORY:  Family History  Problem Relation Age of Onset   Diabetes Mother    Colon polyps Mother    Heart disease Mother    Hypertension Mother    Colon cancer Father    Kidney disease Maternal Grandmother    Colon cancer Maternal Grandfather    Esophageal cancer Maternal Uncle    Colon cancer Maternal Uncle     SOCIAL HISTORY: Social History   Socioeconomic History   Marital status: Married    Spouse name: Not on file   Number of children: 0   Years of education: Not on file   Highest education level: Not on file  Occupational History   Occupation: MD  Tobacco Use   Smoking status: Never    Passive exposure: Never   Smokeless tobacco: Never  Vaping Use   Vaping status: Never Used  Substance and Sexual Activity   Alcohol use: No   Drug use: No   Sexual activity: Yes  Other Topics Concern   Not on file  Social History Narrative   Not on file   Social Drivers of Health   Financial Resource Strain: Not on file  Food Insecurity: Not on file  Transportation Needs: Not on file  Physical Activity: Not on file  Stress: Not on file  Social Connections: Not on file    MEDICATIONS:  Current Outpatient Medications  Medication Sig Dispense Refill   acetaminophen  (TYLENOL ) 500 MG tablet Take 500-1,000 mg by mouth every 4 (four) hours as needed for moderate pain.     aspirin  EC 81 MG tablet Take 81 mg by mouth daily.     atropine 1 % ophthalmic solution Place 1 drop into the right eye daily.     brimonidine (ALPHAGAN) 0.2 % ophthalmic solution Place 1 drop into both eyes in the morning and at bedtime.     cabergoline  (DOSTINEX ) 0.5 MG tablet Take 1 tablet 4 times a week on Monday, Wednesday and Friday right after dialysis and Saturdays. 48 tablet 3   calcitRIOL (ROCALTROL) 0.25 MCG capsule Take 0.25 mcg by  mouth daily.     cloNIDine  (CATAPRES  - DOSED IN MG/24 HR) 0.3 mg/24hr patch Place 0.3 mg onto the skin once a week. Changes on Friday. Apply to left upper back.     cloNIDine  (CATAPRES ) 0.1 MG tablet Take 0.1 mg by mouth 3 (three) times daily as needed (Breakthrough hypertension).     dorzolamide-timolol (COSOPT) 2-0.5 % ophthalmic solution Place 1 drop into both eyes 2 (two) times daily.     hydrALAZINE  (APRESOLINE ) 25 MG tablet Take 25 mg by mouth 3 (three) times daily.     hydrALAZINE  (APRESOLINE ) 50 MG tablet Take 50 mg by mouth in the morning and at bedtime.     isosorbide  mononitrate (IMDUR ) 60 MG 24 hr tablet Take 60 mg by mouth daily. AM     Multiple Vitamins-Minerals (MULTIVITAL PO) Take 1 tablet by mouth daily.     nebivolol (BYSTOLIC) 10 MG tablet Take 10 mg by mouth daily.     nebivolol (BYSTOLIC) 5 MG tablet Take 5 mg by mouth at bedtime.     NIFEdipine (ADALAT CC) 30 MG 24 hr tablet Take 30 mg by mouth daily. AM     olmesartan (BENICAR) 20 MG tablet Take 20 mg by mouth at bedtime.     oxyCODONE -acetaminophen  (PERCOCET) 5-325 MG tablet Take 1 tablet by mouth every 6 (six) hours as needed for severe pain. 15 tablet 0   timolol (BETIMOL) 0.25 % ophthalmic solution Place 1-2 drops into both eyes 2 (two) times daily.     torsemide (DEMADEX) 100 MG tablet Take 100 mg by mouth every morning.     VITAMIN A PO Take 1 tablet by mouth daily.     Vitamin D , Ergocalciferol , (DRISDOL ) 1.25 MG (50000 UNIT) CAPS capsule Take 1 capsule (50,000 Units total) by mouth every 7 (seven) days. 12 capsule 2   No current facility-administered medications for this visit.    PHYSICAL EXAM: Vitals:   11/19/23 1507  BP: 118/72  Pulse: 75  Resp: 20  SpO2: 97%  Weight: 196 lb 7.4 oz (89.1 kg)  Height: 5' 11 (1.803 m)    Body mass index is 27.4 kg/m.  Wt Readings from Last 3 Encounters:  11/19/23 196 lb 7.4 oz (89.1  kg)  04/27/23 195 lb (88.5 kg)  04/09/23 198 lb (89.8 kg)    General: Well  developed, well nourished male in no apparent distress.  HEENT: AT/[place], no external lesions. Hearing intact to the spoken word Eyes: Decreased vision significantly bilaterally.   Neck: Trachea midline, neck supple Neurologic: Alert, oriented, normal speech Extremities: No pedal pitting edema Skin: Warm Psychiatric: Does not appear depressed or anxious  PERTINENT HISTORIC LABORATORY AND IMAGING STUDIES:  All pertinent laboratory results were reviewed. Please see HPI also for further details.    Latest Reference Range & Units 05/28/20 11:31 04/12/21 13:50 12/13/21 14:51 09/02/22 11:12  Prolactin 3.6 - 25.2 ng/mL 19.3 (H) 6.3 8.5 16.8  (H): Data is abnormally high   Latest Reference Range & Units 05/28/20 11:31 04/12/21 13:50 12/13/21 14:51 09/02/22 11:12  Testosterone  300.00 - 890.00 ng/dL 706.98 (L) 708.41 (L) 557 214.71 (L)  (L): Data is abnormally low   Latest Reference Range & Units 05/28/20 11:27 04/12/21 13:50 12/13/21 14:51 09/02/22 11:07  Hemoglobin A1C 4.0 - 5.6 % -  5.2 Pend 5.3 5.0 5.0 Pend    ASSESSMENT / PLAN  1. Hyperprolactinemia (HCC)   2. Controlled type 2 diabetes mellitus without complication, without long-term current use of insulin  (HCC)   3. Macroprolactinoma (HCC)   4. Low testosterone    5. Prolactin-secreting pituitary adenoma (HCC)    - Patient has macroprolactinoma with hyperprolactinemia since 2016.  Baseline pituitary adenoma was 2.3 cm,, size has significantly increased after being on cabergoline  to 1.1 cm as of MRI in August 2021.  Baseline prolactin was 1315, prolactin had normalized after being on cabergoline . -He was taking cabergoline  0.25 mg and 0.5 mg a week, on Fridays and Tuesdays respectively, total of 0.75 mg/week.  However with the start of hemodialysis for ESRD his prolactin significantly elevated to 131.  Cabergoline  is gradually increased with monitoring prolactin level. - He is currently taking cabergoline  0.5 mg 1 tablet 4 times a week  after dialysis.  Dose was increased when prolactin was 57 in April 2025.  -He had low testosterone  when pituitary tumor was diagnosed however lately has been mostly in the normal range with occasionally mildly low total testosterone .  Denies symptoms.  Has not been on testosterone  therapy.  Testosterone  level was lower in January 2025 when prolactin was high.  Plan: -Check prolactin level and adjust the dose of cabergoline  as needed. -Check total, free testosterone  along   # Controlled type 2 diabetes mellitus -Controlled on diet alone and he also does exercise.  He has not been on antidiabetic medication for several years.  A1c has remained mostly in the 5 range.  However he has anemia and ESRD.  Blood sugar has been controlled no concerning hyperglycemia. - Hemoglobin A1c 5.2% today. -Advised to monitor blood sugar at home few times a week.  Asked to bring glucometer in the follow-up visit. -No plan for antidiabetic medication. -Will manage with diet and exercise.  Endocrinology follow-up every 6 months.  Diagnoses and all orders for this visit:  Hyperprolactinemia (HCC)  Controlled type 2 diabetes mellitus without complication, without long-term current use of insulin  (HCC) -     POCT glycosylated hemoglobin (Hb A1C)  Macroprolactinoma (HCC) -     Prolactin  Low testosterone  -     Testosterone  , Free and Total  Prolactin-secreting pituitary adenoma (HCC)    DISPOSITION Follow up in clinic in 6 months suggested.  All questions answered and patient verbalized understanding of the plan.  Roberto Le Ferraz,  MD [facility] Endocrinology [facility] [address] [place], [address] [place], [place] [address] Phone # [phone]  At least part of this note was generated using voice recognition software. Inadvertent word errors may have occurred, which were not recognized during the proofreading process.

## 2023-11-25 ENCOUNTER — Other Ambulatory Visit: Payer: Self-pay

## 2023-11-25 DIAGNOSIS — D352 Benign neoplasm of pituitary gland: Secondary | ICD-10-CM

## 2023-11-25 MED ORDER — CABERGOLINE 0.5 MG PO TABS: ORAL_TABLET | ORAL | 3 refills | Status: AC

## 2023-11-26 LAB — PROLACTIN: Prolactin: 48.4 ng/mL — ABNORMAL HIGH (ref 2.0–18.0)

## 2023-11-26 LAB — TESTOSTERONE, FREE & TOTAL

## 2023-11-27 ENCOUNTER — Other Ambulatory Visit: Payer: Self-pay | Admitting: Endocrinology

## 2023-11-27 DIAGNOSIS — E221 Hyperprolactinemia: Secondary | ICD-10-CM

## 2023-11-27 DIAGNOSIS — D352 Benign neoplasm of pituitary gland: Secondary | ICD-10-CM

## 2023-11-27 NOTE — Progress Notes (Signed)
 Patient would like to know when his prolactin will be checked again  Patient given results and next steps of treatment as directed by MD. No further questions at this time.

## 2023-12-10 ENCOUNTER — Encounter: Payer: Self-pay | Admitting: Nurse Practitioner

## 2023-12-10 ENCOUNTER — Ambulatory Visit: Admitting: Nurse Practitioner

## 2023-12-10 VITALS — BP 130/64 | HR 84

## 2023-12-10 DIAGNOSIS — R1310 Dysphagia, unspecified: Secondary | ICD-10-CM

## 2023-12-10 DIAGNOSIS — K5909 Other constipation: Secondary | ICD-10-CM | POA: Diagnosis not present

## 2023-12-10 DIAGNOSIS — N186 End stage renal disease: Secondary | ICD-10-CM

## 2023-12-10 DIAGNOSIS — Z860100 Personal history of colon polyps, unspecified: Secondary | ICD-10-CM

## 2023-12-10 DIAGNOSIS — Z860101 Personal history of adenomatous and serrated colon polyps: Secondary | ICD-10-CM

## 2023-12-10 DIAGNOSIS — K5900 Constipation, unspecified: Secondary | ICD-10-CM

## 2023-12-10 MED ORDER — NA SULFATE-K SULFATE-MG SULF 17.5-3.13-1.6 GM/177ML PO SOLN: 1.0000 | ORAL | 0 refills | Status: DC

## 2023-12-10 NOTE — Progress Notes (Signed)
 Agree with assessment and plan as outlined.

## 2023-12-10 NOTE — Progress Notes (Signed)
 12/10/2023 Roberto Carliss [name], MD [id number] 1960-09-18   CHIEF COMPLAINT: Discuss scheduling a colonoscopy  HISTORY OF PRESENT ILLNESS: Roberto [name]. [name] is a 63 year old male with a past medical history of hypertension, diabetes mellitus type 2 (patient reported diabetes resolved after prolactinoma was controlled), chronic anemia, ESRD on HD every M-W-F who is currently undergoing renal transplant evaluation at [facility].  He is also legally blind following bilateral retinal surgery with postoperative glaucoma. He is known by [name]. He presents today to schedule a colonoscopy which is required for his renal transplant workup. He underwent a colonoscopy by [name] 11/13/2016 which identified one 3 mm tubular adenomatous polyp removed from the transverse colon. He was advised to repeat a colonoscopy in 5 years which was not done. Father with history of rectal cancer diagnosed at the age 59. Mother with history of colon polyps.  He endorses passing a formed stool every 2 to 3 days. He is legally blind therefore he cannot determine if he is passing any bloody or black stools. No upper or lower abdominal pain. No heartburn. He endorses having new onset dysphagia for the past 3 weeks which occurs every third meal or every other day. He describes having difficulty swallowing foods such as bread which gets stuck in his throat or upper esophagus and passes down after he drinks a few sips of water. No odynophagia. He denies ever having an EGD.  Labs per nephrology notes in care everywhere: 11/30/2023: Hemoglobin 9.8 11/27/2023: Hemoglobin 9.8 09/30/2023: Hemoglobin tens  Labs 04/30/2023: WBC 2.7.  Hemoglobin 10.1.  MCV 100.4.  Platelets 151.   Thoracic echo 10/27/2023: The left ventricular size is normal. Mild left ventricular hypertrophy. Left ventricular systolic  function is normal. LV ejection fraction = 60-65%.  Left ventricular filling pattern is prolonged relaxation.  The right  ventricle is normal in size and function.  The left atrium is mildly dilated.  The right atrium is mildly dilated.  There is no significant valvular stenosis or regurgitation.  Dilated IVC with normal collapsibility consistent with a mildly elevated RA pressure.  There is no pericardial effusion.  Compared to the prior study, there is probably no significant change.   NM cardiac SPECT 10/27/2023: Normal myocardial perfusion.  Normal left ventricular function.   Colonoscopy 11/13/2016 by [name]: - One 3 mm polyp in the transverse colon, removed with a cold snare. Resected and retrieved. - Small internal hemorrhoids.  - The examination was otherwise normal - 5-year recall colonoscopy Surgical [P], transverse, polyp - TUBULAR ADENOMA (ONE FRAGMENT). - NO HIGH GRADE DYSPLASIA OR MALIGNANCY  Past Medical History:  Diagnosis Date   Anemia    Chronic kidney disease    Stage 4   Detached retina    Diabetes mellitus without complication (HCC)    pt states his A1C was has been in normal range for several years now.   H/O hypogonadism    History of kidney stones    passed   History of pituitary tumor    Hypertension    Prolactinoma, benign (HCC)    Vision loss, bilateral    Past Surgical History:  Procedure Laterality Date   A/V FISTULAGRAM N/A 04/27/2023   Procedure: A/V Fistulagram;  Surgeon: Melia Lynwood ORN, MD;  Location: [facility] INVASIVE CV LAB;  Service: Cardiovascular;  Laterality: N/A;   AV FISTULA PLACEMENT Left 12/25/2021   Procedure: LEFT RADIAL CEPHALIC FISTULA CREATION;  Surgeon: Serene Gaile ORN, MD;  Location: [facility] OR;  Service:  Vascular;  Laterality: Left;   AV FISTULA PLACEMENT Left 03/26/2022   Procedure: LEFT UPPER EXTREMITY BRACHIOCEPHALIC ARTERIOVENOUS (AV) FISTULA CREATION;  Surgeon: Serene Gaile ORN, MD;  Location: [facility] OR;  Service: Vascular;  Laterality: Left;   BASCILIC VEIN TRANSPOSITION Left 07/18/2022   Procedure: LEFT SECOND STAGE BASILIC VEIN TRANSPOSITION;   Surgeon: Serene Gaile ORN, MD;  Location: [facility] OR;  Service: Vascular;  Laterality: Left;   COLONOSCOPY     EYE SURGERY     retina surgery - 2018?   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  He is divorced. Nonsmoker. No alcohol use. No drug use.    Family History: Father with history of rectal cancer.  Mother with history of colon polyps and diabetes.  Maternal uncle with esophageal cancer.   Allergies  Allergen Reactions   Contrast Media [Iodinated Contrast Media] Rash    Outpatient Encounter Medications as of 12/10/2023  Medication Sig   acetaminophen  (TYLENOL ) 500 MG tablet Take 500-1,000 mg by mouth every 4 (four) hours as needed for moderate pain.   aspirin  EC 81 MG tablet Take 81 mg by mouth daily.   atropine 1 % ophthalmic solution Place 1 drop into the right eye daily.   brimonidine (ALPHAGAN) 0.2 % ophthalmic solution Place 1 drop into both eyes in the morning and at bedtime.   cabergoline  (DOSTINEX ) 0.5 MG tablet Take 1 tablet 4 times a week on Monday, Wednesday and Friday right after dialysis and Saturdays.   calcitRIOL (ROCALTROL) 0.25 MCG capsule Take 0.25 mcg by mouth daily.   cloNIDine  (CATAPRES  - DOSED IN MG/24 HR) 0.3 mg/24hr patch Place 0.3 mg onto the skin once a week. Changes on Friday. Apply to left upper back.   cloNIDine  (CATAPRES ) 0.1 MG tablet Take 0.1 mg by mouth 3 (three) times daily as needed (Breakthrough hypertension).    dorzolamide-timolol (COSOPT) 2-0.5 % ophthalmic solution Place 1 drop into both eyes 2 (two) times daily.   hydrALAZINE  (APRESOLINE ) 25 MG tablet Take 25 mg by mouth 3 (three) times daily.   hydrALAZINE  (APRESOLINE ) 50 MG tablet Take 50 mg by mouth in the morning and at bedtime.   isosorbide  mononitrate (IMDUR ) 60 MG 24 hr tablet Take 60 mg by mouth daily. AM   Multiple Vitamins-Minerals (MULTIVITAL PO) Take 1 tablet by mouth daily.   nebivolol (BYSTOLIC) 10 MG tablet Take 10 mg by mouth daily.   nebivolol (BYSTOLIC) 5 MG tablet Take 5 mg by mouth at bedtime.   NIFEdipine (ADALAT CC) 30 MG 24 hr tablet Take 30 mg by mouth daily. AM   olmesartan (BENICAR) 20 MG tablet Take 20 mg by mouth at bedtime.   oxyCODONE -acetaminophen  (PERCOCET) 5-325 MG tablet Take 1 tablet by mouth every 6 (six) hours as needed for severe pain.   timolol (BETIMOL) 0.25 % ophthalmic solution Place 1-2 drops into both eyes 2 (two) times daily.   torsemide (DEMADEX) 100 MG tablet Take 100 mg by mouth every morning.   VITAMIN  A PO Take 1 tablet by mouth daily.   Vitamin D , Ergocalciferol , (DRISDOL ) 1.25 MG (50000 UNIT) CAPS capsule Take 1 capsule (50,000 Units total) by mouth every 7 (seven) days.   No facility-administered encounter medications on file as of 12/10/2023.   REVIEW OF SYSTEMS:  Gen: + Weight loss since he started hemodialysis 01/2023. CV: Denies chest pain, palpitations or edema. Resp: Denies cough, shortness of breath of hemoptysis.  GI: See HPI. GU: Denies urinary burning, blood in urine, increased urinary frequency or incontinence. MS: Denies joint pain, muscles aches or weakness. Derm: Denies rash, itchiness, skin lesions or unhealing ulcers. Psych: Denies depression, anxiety, memory loss or confusion. Heme: Denies bruising, easy bleeding. Neuro:  Denies headaches, dizziness or paresthesias. Endo: See HPI.  PHYSICAL EXAM: BP 130/64 (BP Location: Right Arm, Patient Position: Sitting, Cuff Size:  Normal)   Pulse 84 Comment: ierrgular  General: 63 year old male presents in a wheelchair secondary to visual impairment. He is ambulatory with assistance.  In no acute distress. Head: Normocephalic and atraumatic. Eyes:  Sclerae non-icteric, conjunctive pink. Ears: Normal auditory acuity. Mouth: Dentition intact. No ulcers or lesions.  Neck: Supple, no lymphadenopathy or thyromegaly.  Lungs: Clear bilaterally to auscultation without wheezes, crackles or rhonchi. Heart: Regular rate and rhythm. No murmur, rub or gallop appreciated.  Abdomen: Soft, nontender, nondistended. No masses. Small umbilical hernia. No hepatosplenomegaly. Normoactive bowel sounds x 4 quadrants.  Rectal: Deferred. Musculoskeletal: Symmetrical with no gross deformities. Skin: Warm and dry. No rash or lesions on visible extremities. Extremities: LUE fistula with positive bruit and thrill.  Mild bilateral lower extremity edema. Neurological: Alert oriented x 4, no focal deficits.  Psychological: Alert and cooperative. Normal mood and affect.  ASSESSMENT AND PLAN:  63 year old male with a history of a 3 mm tubular adenomatous polyp per colonoscopy 8/18 presents to schedule a colonoscopy which is required for renal transplant workup.  Father with history of rectal cancer.  Mother with history of colon polyps. - Colonoscopy at [facility] benefits and risks discussed including risk with sedation, risk of bleeding, perforation and infection  - Schedule on a nondialysis day - Patient instructed to take MiraLAX nightly for 7 nights prior to colonoscopy prep date  New onset dysphagia x 3 weeks - EGD with possible esophageal dilatation to be completed at time of colonoscopy it was a long hospital benefits and risks discussed including risk with sedation, risk of bleeding, perforation and infection  - If EGD unrevealing I recommend a barium swallow study with tablet and ENT evaluation to include laryngoscopy - Patient to contact  office if symptoms worsen prior to EGD date - Patient instructed to avoid eating large pieces of meat, bread or rice  Chronic constipation - MiraLAX nightly as needed  ESRD on HD every M-W-F undergoing kidney transplant evaluation at [facility]   Chronic anemia secondary to ESRD  Diabetes mellitus type 2 - Continue follow-up with endocrinology  Patient is legally blind     CC:  No ref. provider found

## 2023-12-10 NOTE — Patient Instructions (Signed)
 Start Miralax 1 capful dissolved in at least 8 ounces of water  nightly ( 7 days prior to your colonoscopy on : 02/11/24)  You may use Miralax as needed ( right now) if needed.   You have been scheduled for a colonoscopy. Please follow written instructions given to you at your visit today.   If you use inhalers (even only as needed), please bring them with you on the day of your procedure.  DO NOT TAKE 7 DAYS PRIOR TO TEST- Trulicity (dulaglutide) Ozempic , Wegovy  (semaglutide ) Mounjaro (tirzepatide) Bydureon Bcise (exanatide extended release)  DO NOT TAKE 1 DAY PRIOR TO YOUR TEST Rybelsus  (semaglutide ) Adlyxin (lixisenatide) Victoza (liraglutide) Byetta (exanatide) ___________________________________________________________________________   _______________________________________________________  If your blood pressure at your visit was 140/90 or greater, please contact your primary care physician to follow up on this.  _______________________________________________________  If you are age 13 or older, your body mass index should be between 23-30. Your There is no height or weight on file to calculate BMI. If this is out of the aforementioned range listed, please consider follow up with your Primary Care Provider.  If you are age 43 or younger, your body mass index should be between 19-25. Your There is no height or weight on file to calculate BMI. If this is out of the aformentioned range listed, please consider follow up with your Primary Care Provider.   ________________________________________________________  The [facility] providers would like to encourage you to use MYCHART to communicate with providers for non-urgent requests or questions.  Due to long hold times on the telephone, sending your provider a message by [facility] may be a faster and more efficient way to get a response.  Please allow 48 business hours for a response.  Please remember that this is for non-urgent  requests.  _______________________________________________________  [facility] is using a team-based approach to care.  Your team is made up of your doctor and two to three APPS. Our APPS (Nurse Practitioners and Physician Assistants) work with your physician to ensure care continuity for you. They are fully qualified to address your health concerns and develop a treatment plan. They communicate directly with your gastroenterologist to care for you. Seeing the Advanced Practice Practitioners on your physician's team can help you by facilitating care more promptly, often allowing for earlier appointments, access to diagnostic testing, procedures, and other specialty referrals.   Due to recent changes in healthcare laws, you may see the results of your imaging and laboratory studies on MyChart before your provider has had a chance to review them.  We understand that in some cases there may be results that are confusing or concerning to you. Not all laboratory results come back in the same time frame and the provider may be waiting for multiple results in order to interpret others.  Please give us  48 hours in order for your provider to thoroughly review all the results before contacting the office for clarification of your results.   Thank you for trusting me with your gastrointestinal care!   Elida Shawl, CRNP

## 2023-12-15 ENCOUNTER — Ambulatory Visit: Admitting: Endocrinology

## 2024-01-02 ENCOUNTER — Emergency Department (HOSPITAL_COMMUNITY)
Admission: EM | Admit: 2024-01-02 | Discharge: 2024-01-02 | Disposition: A | Discharge status: Home / Self Care | Attending: Emergency Medicine | Admitting: Emergency Medicine

## 2024-01-02 ENCOUNTER — Encounter (HOSPITAL_COMMUNITY): Payer: Self-pay

## 2024-01-02 ENCOUNTER — Other Ambulatory Visit: Payer: Self-pay

## 2024-01-02 DIAGNOSIS — I953 Hypotension of hemodialysis: Secondary | ICD-10-CM | POA: Insufficient documentation

## 2024-01-02 DIAGNOSIS — N186 End stage renal disease: Secondary | ICD-10-CM | POA: Insufficient documentation

## 2024-01-02 DIAGNOSIS — Z992 Dependence on renal dialysis: Secondary | ICD-10-CM | POA: Diagnosis not present

## 2024-01-02 DIAGNOSIS — Z5321 Procedure and treatment not carried out due to patient leaving prior to being seen by health care provider: Secondary | ICD-10-CM | POA: Insufficient documentation

## 2024-01-02 LAB — CBC
HCT: 28.4 % — ABNORMAL LOW (ref 39.0–52.0)
Hemoglobin: 8.9 g/dL — ABNORMAL LOW (ref 13.0–17.0)
MCH: 32 pg (ref 26.0–34.0)
MCHC: 31.3 g/dL (ref 30.0–36.0)
MCV: 102.2 fL — ABNORMAL HIGH (ref 80.0–100.0)
Platelets: 152 K/uL (ref 150–400)
RBC: 2.78 MIL/uL — ABNORMAL LOW (ref 4.22–5.81)
RDW: 14.4 % (ref 11.5–15.5)
WBC: 2.9 K/uL — ABNORMAL LOW (ref 4.0–10.5)
nRBC: 0 % (ref 0.0–0.2)

## 2024-01-02 LAB — COMPREHENSIVE METABOLIC PANEL WITH GFR
ALT: 12 U/L (ref 0–44)
AST: 20 U/L (ref 15–41)
Albumin: 3.4 g/dL — ABNORMAL LOW (ref 3.5–5.0)
Alkaline Phosphatase: 86 U/L (ref 38–126)
Anion gap: 12 (ref 5–15)
BUN: 41 mg/dL — ABNORMAL HIGH (ref 8–23)
CO2: 28 mmol/L (ref 22–32)
Calcium: 8.6 mg/dL — ABNORMAL LOW (ref 8.9–10.3)
Chloride: 95 mmol/L — ABNORMAL LOW (ref 98–111)
Creatinine, Ser: 6.25 mg/dL — ABNORMAL HIGH (ref 0.61–1.24)
GFR, Estimated: 9 mL/min — ABNORMAL LOW (ref >60)
Glucose, Bld: 170 mg/dL — ABNORMAL HIGH (ref 70–99)
Potassium: 3.5 mmol/L (ref 3.5–5.1)
Sodium: 135 mmol/L (ref 135–145)
Total Bilirubin: 0.7 mg/dL (ref 0.0–1.2)
Total Protein: 6.9 g/dL (ref 6.5–8.1)

## 2024-01-02 LAB — CBG MONITORING, ED: Glucose-Capillary: 163 mg/dL — ABNORMAL HIGH (ref 70–99)

## 2024-01-02 NOTE — ED Notes (Signed)
 Patient states that his BP is fine and he wants to go home. RN explained the risks of him leaving. He stated he would come back if anything changes.

## 2024-01-02 NOTE — ED Triage Notes (Signed)
 Pt reports SBP in the 90s while in dialysis treatment and he felt bad. Pt states he is feeling better now.

## 2024-01-02 NOTE — ED Triage Notes (Signed)
 Patient here with complaints of hypotension after his dialysis treatment today.

## 2024-01-05 NOTE — Telephone Encounter (Signed)
 Chart reviewed for dialysis update  Roberto [name] [id number] May 08, 2060- Referral received 03/29/19. Evaluation began 07/11/19. Waitlisted 07/16/20. Currently active (status 1) on Kidney list.  Maintenance evaluation due every year. Next Maintenance due 05/2024. Last PRA 12/29/23.

## 2024-01-19 ENCOUNTER — Other Ambulatory Visit

## 2024-01-26 NOTE — Telephone Encounter (Signed)
 Avastin  for OD - No PA Required; Coverage not guaranteed  [facility] received a Prior Authorization request for [id number] Avastin  (bevacizumab ) at provider-based clinic for OD from Dr. Lorrene Pugh.  Primary - Medicare Prior Authorization is not required for Traditional Medicare. Z89.6406 is an FDA-approved diagnosis for J9035 Avastin  (bevacizumab ) at provider-based clinic, [phone], [phone] for OD Treatment must be considered to be medically reasonable and necessary and within FDA guidelines. Currently, Medicare does not have a National Coverage Determination (NCD) that addresses Avastin . Local Coverage Articles (LCAs) do not exist at this time for J9035. Ok to [employer] has historically been covered but MAC cannot guarantee coverage.   Secondary - [employer]BC [place] Indemnity  Per Blue-E: Follows Medicare guidelines: No PA required OK to [employer] through medical benefit [employer] [facility]

## 2024-02-03 ENCOUNTER — Other Ambulatory Visit: Payer: Self-pay | Admitting: Endocrinology

## 2024-02-03 DIAGNOSIS — D352 Benign neoplasm of pituitary gland: Secondary | ICD-10-CM

## 2024-02-03 NOTE — Telephone Encounter (Signed)
 Refill request complete

## 2024-02-08 NOTE — Progress Notes (Signed)
 Patient remains active at this time, however is due for colonoscopy. Has not been presented for committee following his maintenance appts.   Colonoscopy scheduled for January, will work in to upcoming [employer] as volume allows. .  Electronically signed by: Rosina LOISE Dasen, RN 02/08/2024 2:52 PM

## 2024-02-09 NOTE — Progress Notes (Signed)
 Otolaryngology Clinic Note  HPI:     History of Present Illness Roberto [name] is a 63 year old gentleman who presents for concerns of postnasal drip.  He is a new patient and referred by his PCP, he has a history of being in a wheelchair and is dialysis-dependent due to end-stage renal disease.  He reports a chronic cough and postnasal drip that began approximately 3 months ago, coinciding with the initiation of his dialysis treatment. He notes that the removal of excess fluid during dialysis has resulted in weight loss. He is scheduled for an endoscopy on 02/18/2024 to investigate potential reflux issues. His symptoms are exacerbated by recent cold weather, leading to increased nasal drainage and coughing spasms lasting 2 to 3 minutes. He does not smoke or consume alcohol but occasionally uses peppermint. He avoids spicy foods. He experiences hoarseness when consuming cold beverages and occasionally when leaning forward. He reports no sore throat or sensation of a lump in the throat. He describes his cough as more prominent than his nasal drainage, which he characterizes as clear. He has attempted to manage these symptoms with amoxicillin and Vicks, the latter of which provided some relief. He has not yet tried Zantac and is seeking advice on the use of an antihistamine H2 blocker. He has no known history of acid reflux. He recalls episodes of nasal congestion during his childhood, which were managed with steroid injections until they resolved around the sixth grade. He currently does not experience congestion related to allergies.  SOCIAL HISTORY He does not smoke or drink alcohol.  PMH/Meds/All/SocHx/FamHx/ROS:   Medical History[1]  Surgical History[2]  No family history of bleeding disorders, wound healing problems or difficulty with anesthesia.   Social History   Socioeconomic History  . Marital status: Married    Spouse name: Not on file  . Number of children: Not on file  . Years of  education: Not on file  . Highest education level: Not on file  Occupational History  . Not on file  Tobacco Use  . Smoking status: Never  . Smokeless tobacco: Never  Substance and Sexual Activity  . Alcohol use: No  . Drug use: No  . Sexual activity: Not on file  Other Topics Concern  . 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 The remainder of the ROS are negative.    Physical Exam:    There were no vitals taken for this visit.   General: Well developed, well nourished. No acute distress. Voice normal.  Head/Face: Normocephalic, atraumatic. No scars or lesions. No sinus tenderness. Facial nerve intact and equal bilaterally. No facial lacerations. TMJ nontender to palpation.  Eyes: Pupils are equal, round and reactive to light. Conjunctiva and lids are normal. Normal extraocular mobility.   Ears:   Right: Pinna and external meatus normal, normal ear canal skin and caliber without excessive cerumen or drainage. Tympanic membrane intact without effusion or infection.    Left: Pinna and external meatus normal, normal ear canal skin and caliber without excessive cerumen or drainage. Tympanic membrane intact without effusion or infection.   Hearing: Normal speech reception to conversational tone.  Nose: No gross  deformity or lesions. No purulent discharge. Septum midline, no turbinate hypertrophy with normal mucosa.  Mouth/Oropharynx: Lips normal, teeth and gums normal with good dentition, normal oral vestibule. Normal floor of mouth, tongue and oral mucosa, no mucosal lesions, ulcer or mass, normal tongue mobility. Uvula midline. Hard and soft palate  normal with normal mobility. +1 tonsils, no erythema or exudate.  Larynx: See TFL if applicable  Nasopharynx: See TFL if applicable  Neck: Trachea midline. No masses. No crepitus. Normal thyroid  gland palpation without thyromegaly or nodules palpated. Salivary glands normal to palpation without swelling, erythema or mass.   Lymphatic: No lymphadenopathy in the neck.  Respiratory: No stridor or distress.  Extremities: No edema or cyanosis. Warm and well-perfused.  Neurologic: CN II-XII intact. Alert and oriented to self, place and time.  Normal reflexes and motor skills, balance and coordination. Moving all extremities without gross abnormality.  Psychiatric:  No unusual anxiety or evidence of depression. Appropriate affect.    Physical Exam Specialty Assessment: Nasopharyngoscopy performed. Afrin and lidocaine  used for nasal preparation. Scope passed through the nostril to the back of the nose and throat. No irregularities observed.   Independent Review of Additional Tests or Records:   Medical Records: Review of medical [facility] from [facility] gastroenterology with last note from 11/06/2023 recommended an upper or lower GI endoscopy to be scheduled on 02/18/2024.  Procedures:  Flexible Fiberoptic Endoscopy (CPT [id number]):  Indication for procedure: Flexible laryngoscopy was indicated for cough of 3 months duration and postnasal drip After verbal consent was obtained, 4% lidocaine  and oxymetazoline was sprayed into the patient's bilateral nasal cavities.  A flexible fiberoptic laryngoscope was passed through the patient's right naris down to the level of the cords.  Nasal cavity:  The septum is midline.  The nasal cavity is normal.  There are no masses, lesions, or polyps. There is no epistaxis or discharge.  Nasopharynx:  The eustachian tube orifice and fossa of Rossenmuller are normal.  There are no masses or lesions.  Oropharynx & Hypopharynx:  The base of tongue, vallecula, pyriform  sinuses, and post-cricoid area are normal.  There are no masses or lesions.  There is no pooling of secretions.  Larynx:  The a-e folds, arytenoids, false cords are normal.  The vocal cords were mobile bilaterally with abduction and good adduction.  There are no masses or lesions.  The patient has good sensation with a strong gag reflex.  Subglottis:  Visualizing the subglottis in an awake patient during an outpatient exam is less reliable than  for a sedated patient in the OR setting. However, the subglottis below the vocal cords and the trachea were visualized and appeared normal with no evidence of stenosis.          Right Eustachian Tube      Impression & Plans:  Roberto [name] is a 63 y.o. male with   1. Laryngopharyngeal reflux (LPR)      2. Allergic rhinitis, unspecified seasonality, unspecified trigger      3. Chronic cough       Assessment & Plan 1. Postnasal drip with cough of 3 to months duration and history of allergic rhinitis Symptoms of chronic cough and nasal drainage, which started about 3 months ago coinciding with the initiation of dialysis, suggest postnasal drip. The symptoms are exacerbated by recent cold weather, leading to increased nasal drainage and coughing spasms lasting 2 to 3 minutes. Differential diagnosis includes gastroesophageal reflux disease (GERD)/laryngeal pharyngeal reflux, allergic rhinitis and upper airway  cough syndrome. A nasopharyngoscopy was performed today to evaluate the symptoms.  Findings today were normal without strong inflammation that he has LPR advised patient that he can cut down on peppermint which is reflux trigger and that he could also utilize Flonase and oral antihistamine such as Claritin or Zyrtec.  He may also consider a reflux over-the-counter medication called Reflux Gourmet.  Will contact patient once results of upcoming upper GI endoscopy on 02/18/2024 has resulted as at that point I will have further information to guide his  care.  Patient agreeable with this plan.   Medical Decision Making  Patient is here for a new patient visit for evaluation and treatment of:   A. 3 acute uncomplicated illnesses, level 3: 99203   B. Review and analysis of results:   One unique note for gastroenterology 11/06/2023.  Low level complexity for data analysis reviewed. Level 2: 99202  C: Risk of Complications: Over-the-counter drugs or treatments or products were recommended including: Flonase daily and an oral antihistamine reflux Gourmet and reduction of peppermint in diet, which poses a low risk of morbidity or mortality, level 3: 99203  Overall level of service: [phone]   Medical Records: No external records reviewed, no unique tests ordered. On the day of the visit I spent 35 minutes preparing to see the patient, ordering medications, test, or procedures, and referring and communicating with other health care professionals (not separately reported).This time does not include any time spent performing procedures or assesments that are separately billable.     Electronically signed by: Olam Vina Simpler, NP 02/09/2024 4:06 PM            [1] Past Medical History: Diagnosis Date  . Abnormal brain MRI    optic nerve  . Age-related nuclear cataract of both eyes 05/31/2015  . CKD (chronic kidney disease) stage 3, GFR 30-59 ml/min (CMD) 02/2017   on 09/05/2019 GFR  27 K+ 3.6 Scr 2.79, monitored by Neph and transplant team  . Combined forms of age-related cataract of right eye 07/03/2016  . Glaucoma suspect, bilateral 07/03/2016  . Hypertension   . Hypertensive crisis 07/18/2014  . Hypertensive retinopathy, bilateral 01/16/2011  . Moderate nonproliferative diabetic retinopathy of both eyes with macular edema associated with type 2 diabetes mellitus    (CMD) 08/02/2015  . Nausea after anesthesia   . Neovascular glaucoma of right eye, severe stage 09/01/2019  . Nonproliferative diabetic retinopathy    (CMD) 01/16/2011  .  Pituitary microadenoma    (CMD)   . Primary open angle glaucoma (POAG) of both eyes, moderate stage 09/01/2019  . Prolactin-secreting pituitary adenoma    (CMD) 08/06/2014  . Prolactinoma    (CMD) 08/17/2014  . Pseudophakia of left eye 03/08/2018  . Pseudophakia of right eye 06/27/2019  . Retinal detachment with multiple breaks, left eye 07/06/2017   Added automatically from request for surgery [phone]  . Retinal detachment, rhegmatogenous, left eye 07/07/2017  . Rhegmatogenous retinal detachment of right eye 05/07/2017  . Secondary open-angle glaucoma of right eye, indeterminate stage 05/19/2017  . Type 2 diabetes mellitus    (CMD) 2012   A1C on 09/05/2019 was 4.7  . Vitreous hemorrhage of left eye (CMD) 04/14/2019  [2] Past Surgical History: Procedure Laterality Date  . CATARACT EXTRACTION     Procedure: YAG CAPSULOTOMY  . CATARACT EXTRACTION W/  INTRAOCULAR LENS IMPLANT Left 03/08/2018   Procedure: PHACOEMULSIFICATION PC / IOL GEN ANESTHESIA;  Surgeon: Donnice Sickles, MD;  Location: [facility] OUTPATIENT OR;  Service:  Ophthalmology;  Laterality: Left;  . CATARACT EXTRACTION W/  INTRAOCULAR LENS IMPLANT Right 06/27/2019   Procedure: PHACOEMULSIFICATION PC / IOL GEN ANESTHESIA;  Surgeon: Donnice Sickles, MD;  Location: [facility] OUTPATIENT OR;  Service: Ophthalmology;  Laterality: Right;  . COLONOSCOPY     Procedure: COLONOSCOPY  . DIODE LASER CPC Right 09/27/2019   Procedure: DIODE LASER CPC;  Surgeon: Reyes Thresa Bracket, MD;  Location: [facility] OUTPATIENT OR;  Service: Ophthalmology;  Laterality: Right;  outpatient  . ENDOLASER Left 03/08/2018   Procedure: ENDOLASER;  Surgeon: Lorrene Ozell Pugh, MD;  Location: [facility] OUTPATIENT OR;  Service: Ophthalmology;  Laterality: Left;  . ENDOLASER Right 06/27/2019   Procedure: ENDOLASER;  Surgeon: Lorrene Ozell Pugh, MD;  Location: [facility] OUTPATIENT OR;  Service: Ophthalmology;  Laterality: Right;  . PARS PLANA VITRECTOMY Right 05/11/2017   Procedure: PARS PLANA  VITRECTOMY 23G W/ OIL;  Surgeon: Lorrene Ozell Pugh, MD;  Location: [facility] OUTPATIENT OR;  Service: Ophthalmology;  Laterality: Right;  . PARS PLANA VITRECTOMY Left 07/07/2017   Procedure: PARS PLANA VITRECTOMY 23G W/ OIL;  Surgeon: Lorrene Ozell Pugh, MD;  Location: [facility] OUTPATIENT OR;  Service: Ophthalmology;  Laterality: Left;  . PARS PLANA VITRECTOMY Left 03/08/2018   Procedure: PARS PLANA VITRECTOMY 23G W/ OIL REMOVAL;  Surgeon: Lorrene Ozell Pugh, MD;  Location: [facility] OUTPATIENT OR;  Service: Ophthalmology;  Laterality: Left;  . PARS PLANA VITRECTOMY Left 04/25/2019   Procedure: PARS PLANA VITRECTOMY 23G W/ LASER;  Surgeon: Lorrene Ozell Pugh, MD;  Location: [facility] OUTPATIENT OR;  Service: Ophthalmology;  Laterality: Left;  . PARS PLANA VITRECTOMY Right 06/27/2019   Procedure: PARS PLANA VITRECTOMY 25G W/ OIL REMOVAL;  Surgeon: Lorrene Ozell Pugh, MD;  Location: [facility] OUTPATIENT OR;  Service: Ophthalmology;  Laterality: Right;  . RENAL BIOPSY  03/25/2017   Procedure: RENAL BIOPSY  . SCLERAL BUCKLE REMOVAL Right 05/11/2017   Procedure: SCLERAL BUCKLE;  Surgeon: Lorrene Ozell Pugh, MD;  Location: [facility] OUTPATIENT OR;  Service: Ophthalmology;  Laterality: Right;  . SCLERAL BUCKLE REMOVAL Left 07/07/2017   Procedure: SCLERAL BUCKLE;  Surgeon: Lorrene Ozell Pugh, MD;  Location: [facility] OUTPATIENT OR;  Service: Ophthalmology;  Laterality: Left;  [3]  Current Outpatient Medications:  .  artificial tears,hypromellose, (Pure and Gentle Eye) 0.3 % drop, Administer 1 drop into affected eye(s) 3 (three) times a day., Disp: , Rfl:  .  atropine 1 % ophthalmic solution, Administer 1 drop into each eyes every morning., Disp: 5 mL, Rfl: 11 .  brimonidine (ALPHAGAN) 0.2 % ophthalmic solution, Administer 1 drop into each eyes 3 (three) times a day., Disp: 15 mL, Rfl: 11 .  cabergoline  (DOSTINEX ) 0.5 mg tablet, Take 0.5 mg by mouth 2 (two) times weekly. Indications: a high prolactin level, Disp: , Rfl:  .   cholecalciferol (VITAMIN D3) 2,000 unit cap capsule, Take 2,000 Units by mouth Once Daily., Disp: , Rfl:  .  cloNIDine  (CATAPRES ) 0.1 mg tablet, Take 1 tablet by mouth daily., Disp: , Rfl:  .  cloNIDine  (CATAPRES -TTS) 0.3 mg/24 hr patch, Place 0.3 mg on the skin once a week., Disp: , Rfl:  .  dorzolamide-timoloL (COSOPT) 22.3-6.8 mg/mL ophthalmic solution, Administer 1 drop into each eyes every 12 (twelve) hours., Disp: 10 mL, Rfl: 11 .  ergocalciferol  (VITAMIN D2) 1,250 mcg (50,000 unit) capsule, Take 50,000 Units by mouth once a week., Disp: , Rfl:  .  hydrALAZINE  (APRESOLINE ) 50 mg tablet, Take 50 mg by mouth in the morning and 50 mg at noon and 50 mg in  the evening., Disp: , Rfl:  .  isosorbide  mononitrate (IMDUR ) 60 mg 24 hr tablet, Take 60 mg by mouth every morning., Disp: , Rfl:  .  nebivoloL (BYSTOLIC) 10 mg tablet, Take 10 mg by mouth daily., Disp: , Rfl:  .  NIFEdipine (ADALAT CC) 30 mg ER tablet, Take 1 tablet by mouth daily., Disp: , Rfl:  .  torsemide (DEMADEX) 20 mg tablet, Take 20 mg by mouth every morning., Disp: , Rfl: 10

## 2024-02-10 ENCOUNTER — Telehealth: Payer: Self-pay | Admitting: Gastroenterology

## 2024-02-10 NOTE — Telephone Encounter (Addendum)
 Procedure:Colonoscopy/Endoscopy Procedure date: 02/18/24 Procedure location: [facility] Arrival Time: 8:00 am Spoke with the patient Y/N: Yes Any prep concerns? No  Has the patient obtained the prep from the pharmacy ? Yes Do you have a care partner and transportation: Yes Any additional concerns? No

## 2024-02-11 ENCOUNTER — Encounter (HOSPITAL_COMMUNITY): Payer: Self-pay | Admitting: Gastroenterology

## 2024-02-11 ENCOUNTER — Encounter (HOSPITAL_COMMUNITY): Payer: Self-pay

## 2024-02-11 NOTE — Progress Notes (Signed)
 Attempted to obtain medical history via telephone, unable to reach at this time. Unable to leave voicemail to return pre surgical testing department's phone call,due to mailbox not set up.

## 2024-02-15 NOTE — Progress Notes (Addendum)
 [facility] Transplant Evaluation Committee Meeting    Johnie MARLA Patterson,   Colonoscopy Update Scheduled 01.08.26  Name:Deondray Carliss [name] Presentation Date: 11.18.2  Eval:  03.26.25 Librizzi/Stratta       DOB: 1961-03-23    Age: 63 y.o. MR#: [id number] Gender: male Race: Black or African American    SS#: [id number] ABO: O Positive  [address] [place] [address] [phone]   NO OTHER CENTERS   eGFR Wait time review/modification: NOT ELIGIBLE   CURRENTLY: Status 1, risk 1, KDPI >85%:  Y, Hep B N, Hep C Nat+ N, Karnofsky 70, dual kidney N, PVD N   Original Listing Date: 04.18.22 Days Wait Time: 1310   Ht Readings from Last 1 Encounters:  02/09/24 1.803 m (5' 11)   Wt Readings from Last 3 Encounters:  02/09/24 90.3 kg (199 lb)  06/24/23 98 kg (216 lb)  02/13/22 105 kg (232 lb)  Weight at Listing: 242#  BMI Readings from Last 3 Encounters:  02/09/24 27.75 kg/m  06/24/23 29.29 kg/m  02/13/22 31.46 kg/m  BMI at Listing: 32.9   Nephrologlst: [name]       Chronic Dialysis Start Date:  02/09/2023  CKD/ESRD DX:   Type 2 diabetes mellitus with diabetic chronic kidney disease    (CMD)/Hypertensive Nephrosclerosis  Dialysis Details:   Maintenance Dialysis History      Start End Type Center Comments   02/09/2023  In-center Hemodialysis [facility] M,T,[facility],F    2728 Verified         Current Dialysis Center Information     [facility]     Phone: [phone] Fax:    Address: [address] [place] [address]                  Previous Transplant: N/A   Agrees to accept HCV Nat+: No Agrees to accept KDPI >85%: Yes Can accept HBV Core+: No  Agrees to accept A2B: N/A      EPTS: 82 at 02/15/2024  1:39 PM Calculated from: Age: 18 years Has Diabetes: Yes Prior solid organ transplant: No Dialysis: 1 year     BP Readings from Last 3 Encounters:  02/11/24 (!) 165/83  02/09/24 138/68   12/03/23 129/69   Pulse Readings from Last 3 Encounters:  02/11/24 67  02/09/24 73  10/27/23 72     Patient Active Problem List   Diagnosis Date Noted   . Laryngopharyngeal reflux (LPR) 02/09/2024  . Allergic rhinitis 02/09/2024  . Chronic cough 02/09/2024  . Hyphema of left eye 10/30/2022  . Secondary open-angle glaucoma of both eyes, indeterminate stage 12/16/2021  . Proliferative diabetic retinopathy of both eyes without macular edema associated with type 1 diabetes mellitus (HCC) 11/08/2020  . Status post cataract extraction of both eyes with insertion of intraocular lens 10/04/2019  . Neovascular glaucoma, left eye, severe stage 09/28/2019  . Essential hypertension 09/19/2019  . Stage 4 chronic kidney disease (HCC) 09/19/2019  . Secondary hyperparathyroidism (HCC) 09/19/2019  . Neovascular glaucoma of right eye, severe stage 09/01/2019  . Primary open angle glaucoma (POAG) of both eyes, moderate stage 09/01/2019  . Right retinal detachment 09/01/2019  . Pseudophakia of both eyes 06/28/2019  . Vitreous hemorrhage of left eye (HCC) 04/14/2019  . Moderate nonproliferative diabetic retinopathy of both eyes with macular edema associated with type 1 diabetes mellitus (HCC) 11/17/2018  . Cataract, nuclear sclerotic senile, left 11/17/2018  . Pseudophakia of left eye 11/17/2018  . Retinal detachment, rhegmatogenous, left  eye 07/07/2017  . Retinal detachment with multiple breaks, left eye 07/06/2017    Overview Note:    Added automatically from request for surgery [phone]     . Open angle with borderline findings, low risk, bilateral 06/04/2017  . Rhegmatogenous retinal detachment of right eye 05/07/2017  . Glaucoma suspect, bilateral 07/03/2016  . Combined forms of age-related cataract of right eye 07/03/2016  . Diabetic macular edema of both eyes with mild nonproliferative retinopathy associated with type 2 diabetes mellitus (HCC) 08/02/2015  . Age-related nuclear cataract of  both eyes 05/31/2015  . Prolactinoma (HCC) 08/17/2014  . Prolactin-secreting pituitary adenoma (HCC) 08/06/2014  . Hypertensive crisis 07/18/2014  . Diabetes mellitus (HCC) 02/19/2012  . Nonproliferative diabetic retinopathy (HCC) 01/16/2011  . Diabetic macular edema (HCC) 01/16/2011  . Hypertensive retinopathy, bilateral 01/16/2011    Resolved Problems  No resolved problems to display.    Past Surgical History:  Procedure Laterality Date  . CATARACT EXTRACTION     Procedure: YAG CAPSULOTOMY  . CATARACT EXTRACTION W/  INTRAOCULAR LENS IMPLANT Left 03/08/2018   Procedure: PHACOEMULSIFICATION PC / IOL GEN ANESTHESIA;  Surgeon: Donnice Sickles, MD;  Location: [facility] OUTPATIENT OR;  Service: Ophthalmology;  Laterality: Left;  . CATARACT EXTRACTION W/  INTRAOCULAR LENS IMPLANT Right 06/27/2019   Procedure: PHACOEMULSIFICATION PC / IOL GEN ANESTHESIA;  Surgeon: Donnice Sickles, MD;  Location[facility] OUTPATIENT OR;  Service: Ophthalmology;  Laterality: Right;  . COLONOSCOPY     Procedure: COLONOSCOPY  . DIODE LASER CPC Right 09/27/2019   Procedure: DIODE LASER CPC;  Surgeon: Reyes Thresa Bracket, MD;  Location: [facility] OUTPATIENT OR;  Service: Ophthalmology;  Laterality: Right;  outpatient  . ENDOLASER Left 03/08/2018   Procedure: ENDOLASER;  Surgeon: Lorrene Ozell Pugh, MD;  Location: [facility] OUTPATIENT OR;  Service: Ophthalmology;  Laterality: Left;  . ENDOLASER Right 06/27/2019   Procedure: ENDOLASER;  Surgeon: Lorrene Ozell Pugh, MD;  Location: [facility] OUTPATIENT OR;  Service: Ophthalmology;  Laterality: Right;  . PARS PLANA VITRECTOMY Right 05/11/2017   Procedure: PARS PLANA VITRECTOMY 23G W/ OIL;  Surgeon: Lorrene Ozell Pugh, MD;  Location: [facility] OUTPATIENT OR;  Service: Ophthalmology;  Laterality: Right;  . PARS PLANA VITRECTOMY Left 07/07/2017   Procedure: PARS PLANA VITRECTOMY 23G W/ OIL;  Surgeon: Lorrene Ozell Pugh, MD;  Location: [facility] OUTPATIENT OR;  Service: Ophthalmology;  Laterality: Left;  .  PARS PLANA VITRECTOMY Left 03/08/2018   Procedure: PARS PLANA VITRECTOMY 23G W/ OIL REMOVAL;  Surgeon: Lorrene Ozell Pugh, MD;  Location: [facility] OUTPATIENT OR;  Service: Ophthalmology;  Laterality: Left;  . PARS PLANA VITRECTOMY Left 04/25/2019   Procedure: PARS PLANA VITRECTOMY 23G W/ LASER;  Surgeon: Lorrene Ozell Pugh, MD;  Location: [facility] OUTPATIENT OR;  Service: Ophthalmology;  Laterality: Left;  . PARS PLANA VITRECTOMY Right 06/27/2019   Procedure: PARS PLANA VITRECTOMY 25G W/ OIL REMOVAL;  Surgeon: Lorrene Ozell Pugh, MD;  Location: [facility] OUTPATIENT OR;  Service: Ophthalmology;  Laterality: Right;  . RENAL BIOPSY  03/25/2017   Procedure: RENAL BIOPSY  . SCLERAL BUCKLE REMOVAL Right 05/11/2017   Procedure: SCLERAL BUCKLE;  Surgeon: Lorrene Ozell Pugh, MD;  Location: [facility] OUTPATIENT OR;  Service: Ophthalmology;  Laterality: Right;  . SCLERAL BUCKLE REMOVAL Left 07/07/2017   Procedure: SCLERAL BUCKLE;  Surgeon: Lorrene Ozell Pugh, MD;  Location: [facility] OUTPATIENT OR;  Service: Ophthalmology;  Laterality: Left;      PROVIDER NOTES:  Emma: Ferry Matthis is a pleasant 63 y.o. African American male who was diagnosed with ESRD secondary  to HTN and has been on dialysis since 02/09/2023. Body mass index is 29.29 kg/m., weighs 98 kg (216 lb), and makes 1L urine/day. Presents today to the Transplant clinic for evaluation for possible transplantation.    Pt is currently status 1 on the waiting list. PRA 0%, EPTS 76, Blood type O+, 1079 days on the waiting list.    Interval History: The patient was last seen in pre-transplant clinic on 02/13/2022 by Dr. Gordy.    HPI:  The patient currently dialyzes MWF. The patient reports that he is tolerating dialysis well. They deny intradialytic hypotension. Patient endorses 1 kidney stone in 1994. Denies any history of UTIs, hematuria, or urinary procedures. EDW 94 kg.    Patient has never been diagnosed with T2DM, however he did experience hyperglycemia  secondary to a prolactinoma in 2016 for which he currently takes cabergoline . Hyperglycemia has not resolved, Last A1c was 4.8 on 04/30/23. Patient checks his BS before meals and they range 90-105 pre-prandially. He has never taken insulin .    The patient was diagnosed with HTN around 2019 and reports fairly good control on current regimen nifedipine 30 mg, nebivolol 10 mg, hydralazine  50 mg TID. BP today was 169/77.    Demand ischemia noted in patient's chart from 2020, but patient denies knowledge of this. No history of CAD, heart failure, structural/valvular heart disease. No history of cardiac events or MI. No history of stroke or TIA. No history of blood clots or anticoagulation. No history of COPD or prior lung disease. No history of cancer. The patient does not have a history of oxygen dependence.   Patient had a thrombectomy of his LUE AVD on 04/27/2023. He is not on chronic anticoagulation.    Of note, the patient is blind due to post-operative glaucoma as a complication of retinal surgery.   The patient does not have prior history of tobacco use. The patient does not have prior history of alcohol or drug use.  The patient was [place] .  The patient does have history of foreign travel: [place], [place], [place], [place], [place], [place], [place], [place], [place], [place], [place]  The patient does not have history of chronic infections.   Regarding functional status, the patient is fairly active for a patient with ESRD. The patient does not currently work outside of the home, he is a retired engineer, petroleum. The patient is able to go up a flight of stairs. The patient does exercise regularly, walking and/or lifting weights 3 times a week. The patient can walk approximately 1 mile without stopping to rest. The patient does not develop chest pain, SOB, or leg pain with exertion or ambulation. The patient does not have a history of falls in the last year.   Recent  Hospitalizations/ED visits:  ED 05/25/2023: Patient developed SOB during dialysis and was taken to the ER. Patient was triaged and, after learning his O2 sats were normal, left AMA. Patient reports to me today that the SOB was precipitated by the dialysis center's [facility] unit being broken and that he felt hot.   Abdominal Surgeries: none   Sensitization: No history of blood transfusions or prior transplantation.   Further Investigations recommended:  The patient has risk factors for coronary disease including ESRD and HTN.   -Would recommend surface echocardiogram to evaluate LV systolic function and evaluate for valvular disease.  -Would recommend cardiac stress test in the form of exercise stress echocardiogram.  -Would also recommend iliac dopplers to assess patency of peripheral vasculature.  -  Would recommend carotid doppler studies to assess CV disease burden.   Pt is transplantable at current weight and would not recommend any further weight loss.   Stratta:  Status 1 (active) maintenance evaluation, last seen by Dr Gordy in 11/23, blood type O, EPTS 76%, PRA 0%, BMI 29.3 (has lost 26 lbs in past 3 years), no kidney offers to date, no living donors identified (but has friend/significant other with home today who is interested in being evaluated as a donor), activated on 02/09/23 when he started HD through a left AVF (has waiting time dating back to 07/16/20), followed by Dr Marlee, retired engineer, petroleum, nearly blind following multiple eye procedures.    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   Pending completion of work-up, I believe that the patient remains an acceptable candidate for transplantation.  I have reviewed the documentation and agree with it as stated.    Okay for high KDPI/dual/ donor kidney, may have a living donor, minimal PVD, NEEDS A GOOD KIDNEY!       FIT TESTING:  11.16.23    03.26.25        IMAGING   CT Abdomen Pelvis WO Contrast Result Date: 06/24/2023 1.  Mild mural thickening of the rectum and sigmoid colon, may reflect nonspecific proctocolitis in the appropriate clinical setting. 2.  Scattered nodular opacities in the medial aspect of the right lower lobe, may reflect nonspecific infectious/inflammatory process. 3.  Native renal measurements as described above. 4.  Mild atherosclerotic calcification with relative sparing of the bilateral external iliac arteries.  XR Chest 2 Views Result Date: 06/24/2023 Unchanged cardiomegaly. No overt heart failure.     CARDIAC STUDIES  CARDIAC STRESS TEST 07.29.25 Normal myocardial perfusion. Normal left ventricular function.   ECHOCARDIOGRAM 07.29.25 The left ventricular size is normal. Mild left ventricular hypertrophy.  Left ventricular systolic  function is normal. LV ejection fraction = 60-65%.  Left ventricular filling pattern is prolonged relaxation.  The right ventricle is normal in size and function.  The left atrium is mildly dilated.  The right atrium is mildly dilated.  There is no significant valvular stenosis or regurgitation.  Dilated IVC with normal collapsibility consistent with a mildly elevated  RA pressure.  There is no pericardial effusion.  Compared to the prior study, there is probably no significant change.      VASCULAR 05.20.25  CAROTIDS Right  <50% diameter reduction of the internal carotid artery. All Doppler velocities within normal limits. No evidence  of a hemodynamically significant stenosis. The vertebral artery flow is antegrade. Left  <50% diameter reduction of the internal carotid artery. All Doppler velocities within normal limits. No evidence of a hemodynamically significant stenosis. The vertebral artery flow is antegrade.  ILIACS Right  No evidence of a hemodynamically significant stenosis of the common and external iliac arteries. Patent common iliac and external iliac veins.   Left  No evidence of a hemodynamically significant stenosis of the common and external iliac arteries. Patent common iliac and external iliac veins.  CONSULTS/CLEARANCES Order placed for GI Consult/Colonoscopy. R/s into January now.    LABS  Component     Latest Ref Rng 04/30/2023  WBC     4.40 - 11.00 10*3/uL 2.70 (L)   RBC     4.50 - 5.90 10*6/uL 3.04 (L)   Hemoglobin     14.0 - 17.5 g/dL 89.8 (L)   Hematocrit     41.5 - 50.4 % 30.5 (L)   Mean Corpuscular Volume (MCV)     80.0 - 96.0 fL 100.4 (H)   MCH     27.5 - 33.2 pg 33.2   Mean Corpuscular Hemoglobin Conc (MCHC)     33.0 - 37.0 g/dL 66.8   Red Cell Distribution Width (RDW-CV)     12.3 - 17.0 % 14.9   Platelet Count (Plt)     150 - 450 10*3/uL 151   Mean Platelet Volume (MPV)     6.8 - 10.2 fL 9.0   Sodium     136 - 145 mmol/L 142   Potassium     3.5 - 5.1 mmol/L 4.2   Chloride     98 - 107 mmol/L 100   CO2     21 - 31 mmol/L 30   Anion Gap     6 - 14 mmol/L 12   Glucose     70 - 99 mg/dL 880 (H)   Blood Urea Nitrogen     7 - 25 mg/dL 62 (H)   Creatinine     0.70 - 1.30 mg/dL 2.06 (H)   eGFR     >40 mL/min/1.65m2 7 (L)   Albumin     3.5 - 5.7 g/dL 3.8   Total Protein     6.4 - 8.9 g/dL 6.9   Bilirubin Total     0.3 - 1.0 mg/dL 0.5   Alk Phos Total     34 - 104 U/L 83   Aspartate Aminotransferase (AST)     13 - 39 U/L 18   Alanine Aminotransferase     7 - 52 U/L 25   Calcium Level Total     8.6 - 10.3 mg/dL 8.3 (L)   BUN/Creatinine Ratio     10.0 - 20.0   7.8 (L)   Amphetamines, IA     Cutoff:50 ng/mL Negative   Barbiturates, IA     Cutoff:0.1 ug/mL Negative   Benzodiazepines, IA     Cutoff:20 ng/mL Negative   Cocaine & Metabolite, IA     Cutoff:25 ng/mL Negative   Phenycyclidine, IA     Cutoff:8 ng/mL Negative   THC (Marijuana) Metabolite, IA     Cutoff:5 ng/mL Negative   Opiates, IA     Cutoff:5 ng/mL Negative   Oxycodones, IA     Cutoff:5 ng/mL Negative   Methadone, IA     Cutoff:25 ng/mL Negative   Propoxyphene, IA     Cutoff:50 ng/mL Negative   Cholesterol, Total     <200 mg/dL 889   Triglycerides     <150 mg/dL 76   Cholesterol, HDL     >=60 mg/dL 36 (L)   LDL Calculated     <100 mg/dL 58   Non-HDL Cholesterol     mg/dL 74   Iron     50 - 787 ug/dL 67   Transferrin     796 - 362 mg/dL 857 (L)   Ferritin     24 - 336 ng/mL 425 (H)   Total Iron Binding Capacity  290 - 518 ug/dL 796 (L)   Transferrin Saturation     15 - 45 % 33   Epstein Barr Virus VCA Antibody, IgM     0.0 - 35.9 U/mL <36.0   Epstein Barr Virus VCA Antibody, IgG     0.0 - 17.9 U/mL <18.0   Epstein Barr Virus Nuclear Antigen Antibodies, IgG     0.0 - 17.9 U/mL <18.0   Toxoplasma gondii Antibodies, IgG     0.0 - 7.1 IU/mL <3.0   Toxoplasma gondii Antibodies, IgM     0.0 - 7.9 AU/mL <3.0   PT (Protime)     8.9 - 12.1 seconds 11.7   INR     <5.0  1.1   Quantiferon     Negative  Negative   Hemoglobin A1c     <5.7 % 4.8   Estimated Average Glucose     mg/dL 91   Type & Rh (ABORH) O Positive   Amylase     29 - 103 U/L 77   LDH     140 - 271 U/L 151   Lipase     11 - 82 U/L 72   Parathyroid Hormone Intact     12 - 88 pg/mL 731 (H)   Magnesium      1.9 - 2.7 mg/dL 2.1   Phosphorus     2.5 - 5.0 mg/dL 6.0 (H)   RPR     Non-Reactive  Non-Reactive   Strongyloides IgG Antibody     Negative  Negative   Uric Acid     4.4 - 7.6 mg/dL 6.1   Varicella Zoster Antibody, IgG        Positive   Hepatitis B Surface Ag     Non-Reactive   Non-Reactive   Hepatitis B Surface Antibody, Quantitative     Immunity>10 mIU/mL <3.5 (L)   Hep B Core Total Ab     Non-Reactive  Non-Reactive   Hepatitis A Virus Antibody, Total     Non-Reactive  Non-Reactive   Gamma Glutamyl Transferase (GGT)     9 - 64 U/L 55   Cytomegalovirus (CMV) Antibody, IgM     Negative  Negative   Cytomegalovirus (CMV) Antibody, IgG     Negative  Negative   C-Peptide     0.8 - 3.9 ng/mL 12.7 (H)   aPTT     <=30.0 seconds 27.5   B-Type Natriuretic Peptide     <100 pg/mL 450 (H)   Prostate Specific Antigen     0.00 - 4.50 ng/mL 0.70   HIV Ag/Ab Screen     Non-Reactive  Non-Reactive   Hep C Virus Ab     Non-Reactive  Non-Reactive       HEALTH  MAINTENANCE COVID Vaccine:  UTD  Hep  B Vaccine:  Patient is HBV sAb non-reactive.   Flu Vaccine:  UTD Pneumonia Vaccine:  UTD Dental Clearance:  Regular Visits Colonoscopy:  Update is Scheduled 01.2026.   SOCIAL WORK Karnofsky: 70% - Cares for Self: Unable to Carry on Normal Activity or Active Work   Contraindications: While assessment of Patient's psychosocial environment identified no definitive contraindications to transplantation, psychosocial concerns are present.    Concerns:  1. Impaired vision.  2. Complete dependence upon support network for all transportation needs.  3. Wheelchair utilized during assessment appointment and for all community ambulation.   4. Support network primarily composed of significant other and a non-familial salaried individual.    Recommendations:  1. Secondary to identified  concerns, Alphons Burgert would be considered a psychosocially high risk candidate for transplantation from the perspective of Social Work with Patient Selection Committee to make final determination regarding transplant candidacy.  2. Psychosocial reassessment to be conducted subsequent to wait listing date and/or as indicated by presenting Patient needs and identified psychosocial environmental  changes.    Luke Kerns, MSW  Social Work Services Abdominal Organ Transplant Program

## 2024-02-18 ENCOUNTER — Other Ambulatory Visit: Payer: Self-pay

## 2024-02-18 ENCOUNTER — Ambulatory Visit (HOSPITAL_COMMUNITY)
Admission: RE | Admit: 2024-02-18 | Discharge: 2024-02-18 | Disposition: A | Discharge status: Home / Self Care | Attending: Gastroenterology | Admitting: Gastroenterology

## 2024-02-18 ENCOUNTER — Encounter (HOSPITAL_COMMUNITY): Payer: Self-pay | Admitting: Gastroenterology

## 2024-02-18 ENCOUNTER — Ambulatory Visit (HOSPITAL_COMMUNITY): Admitting: Anesthesiology

## 2024-02-18 ENCOUNTER — Encounter (HOSPITAL_COMMUNITY): Admission: RE | Disposition: A | Payer: Self-pay | Source: Home / Self Care | Attending: Gastroenterology

## 2024-02-18 DIAGNOSIS — Z1211 Encounter for screening for malignant neoplasm of colon: Secondary | ICD-10-CM

## 2024-02-18 DIAGNOSIS — K2970 Gastritis, unspecified, without bleeding: Secondary | ICD-10-CM | POA: Diagnosis not present

## 2024-02-18 DIAGNOSIS — K2100 Gastro-esophageal reflux disease with esophagitis, without bleeding: Secondary | ICD-10-CM | POA: Insufficient documentation

## 2024-02-18 DIAGNOSIS — K5900 Constipation, unspecified: Secondary | ICD-10-CM

## 2024-02-18 DIAGNOSIS — Z800 Family history of malignant neoplasm of digestive organs: Secondary | ICD-10-CM | POA: Diagnosis not present

## 2024-02-18 DIAGNOSIS — R1310 Dysphagia, unspecified: Secondary | ICD-10-CM

## 2024-02-18 DIAGNOSIS — K222 Esophageal obstruction: Secondary | ICD-10-CM | POA: Insufficient documentation

## 2024-02-18 DIAGNOSIS — N186 End stage renal disease: Secondary | ICD-10-CM | POA: Insufficient documentation

## 2024-02-18 DIAGNOSIS — Z860100 Personal history of colon polyps, unspecified: Secondary | ICD-10-CM

## 2024-02-18 DIAGNOSIS — E1122 Type 2 diabetes mellitus with diabetic chronic kidney disease: Secondary | ICD-10-CM | POA: Insufficient documentation

## 2024-02-18 DIAGNOSIS — I129 Hypertensive chronic kidney disease with stage 1 through stage 4 chronic kidney disease, or unspecified chronic kidney disease: Secondary | ICD-10-CM | POA: Diagnosis not present

## 2024-02-18 DIAGNOSIS — K449 Diaphragmatic hernia without obstruction or gangrene: Secondary | ICD-10-CM | POA: Insufficient documentation

## 2024-02-18 DIAGNOSIS — Z860101 Personal history of adenomatous and serrated colon polyps: Secondary | ICD-10-CM | POA: Insufficient documentation

## 2024-02-18 DIAGNOSIS — I120 Hypertensive chronic kidney disease with stage 5 chronic kidney disease or end stage renal disease: Secondary | ICD-10-CM | POA: Insufficient documentation

## 2024-02-18 DIAGNOSIS — Q399 Congenital malformation of esophagus, unspecified: Secondary | ICD-10-CM | POA: Insufficient documentation

## 2024-02-18 DIAGNOSIS — Z992 Dependence on renal dialysis: Secondary | ICD-10-CM | POA: Insufficient documentation

## 2024-02-18 HISTORY — PX: COLONOSCOPY: SHX5424

## 2024-02-18 HISTORY — PX: ESOPHAGOGASTRODUODENOSCOPY: SHX5428

## 2024-02-18 LAB — POCT I-STAT, CHEM 8
BUN: 39 mg/dL — ABNORMAL HIGH (ref 8–23)
Calcium, Ion: 1.02 mmol/L — ABNORMAL LOW (ref 1.15–1.40)
Chloride: 97 mmol/L — ABNORMAL LOW (ref 98–111)
Creatinine, Ser: 7.1 mg/dL — ABNORMAL HIGH (ref 0.61–1.24)
Glucose, Bld: 125 mg/dL — ABNORMAL HIGH (ref 70–99)
HCT: 35 % — ABNORMAL LOW (ref 39.0–52.0)
Hemoglobin: 11.9 g/dL — ABNORMAL LOW (ref 13.0–17.0)
Potassium: 3.8 mmol/L (ref 3.5–5.1)
Sodium: 138 mmol/L (ref 135–145)
TCO2: 28 mmol/L (ref 22–32)

## 2024-02-18 SURGERY — COLONOSCOPY
Anesthesia: Monitor Anesthesia Care

## 2024-02-18 MED ORDER — PROPOFOL 10 MG/ML IV BOLUS
INTRAVENOUS | Status: DC | PRN
  Administered 2024-02-18: 50 mg via INTRAVENOUS
  Administered 2024-02-18: 30 mg via INTRAVENOUS

## 2024-02-18 MED ORDER — SODIUM CHLORIDE 0.9 % IV SOLN: INTRAVENOUS | Status: DC

## 2024-02-18 MED ORDER — PROPOFOL 500 MG/50ML IV EMUL
INTRAVENOUS | Status: DC | PRN
Start: 2024-02-18 — End: 2024-02-18
  Administered 2024-02-18: 175 ug/kg/min via INTRAVENOUS

## 2024-02-18 MED ORDER — PHENYLEPHRINE HCL (PRESSORS) 10 MG/ML IV SOLN
INTRAVENOUS | Status: DC | PRN
Start: 2024-02-18 — End: 2024-02-18
  Administered 2024-02-18: 80 ug via INTRAVENOUS

## 2024-02-18 MED ORDER — LIDOCAINE HCL (PF) 2 % IJ SOLN
INTRAMUSCULAR | Status: DC | PRN
  Administered 2024-02-18: 100 mg via INTRADERMAL

## 2024-02-18 MED ORDER — PANTOPRAZOLE SODIUM 40 MG PO TBEC: 40.0000 mg | DELAYED_RELEASE_TABLET | Freq: Every day | ORAL | 1 refills | Status: AC

## 2024-02-18 NOTE — H&P (Signed)
  Gastroenterology History and Physical   Primary Care Physician:  Marlee Bernardino NOVAK, Roberto [name]   Reason for Procedure:   History of colon polyps, dysphagia  Plan:    EGD with possible dilation / colonoscopy     HPI: Roberto Carliss [name], Roberto [name] is a 63 y.o. male  here for EGD and colonoscopy. Case done at the hospital given he has ESRD on HD at higher risk for anesthesia. Last colonoscopy done 10/2016. One small adenoma. Father had colon cancer dx age 37s. He also is having intermittent dysphagia he localizes to throat area. No prior EGD. Also has post nasal drip. EGD to further evaluate, with possible dilation. Patient denies any bowel symptoms at this time. Otherwise feels well without any cardiopulmonary symptoms.   I have discussed risks / benefits of anesthesia and endoscopic procedure with Roberto Carliss [name], Roberto [name] and they wish to proceed with the exams as outlined today.   The patient was provided an opportunity to ask questions and all were answered. The patient agreed with the plan.    Past Medical History:  Diagnosis Date   Anemia    Chronic kidney disease    Stage 4   Detached retina    Diabetes mellitus without complication (HCC)    pt states his A1C was has been in normal range for several years now.   H/O hypogonadism    History of kidney stones    passed   History of pituitary tumor    Hypertension    Prolactinoma, benign (HCC)    Vision loss, bilateral     Past Surgical History:  Procedure Laterality Date   A/V FISTULAGRAM N/A 04/27/2023   Procedure: A/V Fistulagram;  Surgeon: Melia Lynwood ORN, Roberto [name];  Location: [facility] INVASIVE CV LAB;  Service: Cardiovascular;  Laterality: N/A;   AV FISTULA PLACEMENT Left 12/25/2021   Procedure: LEFT RADIAL CEPHALIC FISTULA CREATION;  Surgeon: Serene Gaile ORN, Roberto [name];  Location: [facility] OR;  Service: Vascular;  Laterality: Left;   AV FISTULA PLACEMENT Left 03/26/2022   Procedure: LEFT UPPER EXTREMITY BRACHIOCEPHALIC ARTERIOVENOUS (AV) FISTULA CREATION;   Surgeon: Serene Gaile ORN, Roberto [name];  Location: [facility] OR;  Service: Vascular;  Laterality: Left;   BASCILIC VEIN TRANSPOSITION Left 07/18/2022   Procedure: LEFT SECOND STAGE BASILIC VEIN TRANSPOSITION;  Surgeon: Serene Gaile ORN, Roberto [name];  Location: [facility] OR;  Service: Vascular;  Laterality: Left;   COLONOSCOPY     EYE SURGERY Bilateral    retina surgery - 2018?   KIDNEY STONE SURGERY  1987   PERIPHERAL VASCULAR BALLOON ANGIOPLASTY Left 04/27/2023   Procedure: PERIPHERAL VASCULAR BALLOON ANGIOPLASTY;  Surgeon: Melia Lynwood ORN, Roberto [name];  Location: [facility] INVASIVE CV LAB;  Service: Cardiovascular;  Laterality: Left;  80% outflow swing site   PERIPHERAL VASCULAR THROMBECTOMY Left 04/27/2023   Procedure: PERIPHERAL VASCULAR THROMBECTOMY;  Surgeon: Melia Lynwood ORN, Roberto [name];  Location: [facility] INVASIVE CV LAB;  Service: Cardiovascular;  Laterality: Left;  80% outflow swing site    Prior to Admission medications   Medication Sig Start Date End Date Taking? Authorizing Provider  acetaminophen  (TYLENOL ) 500 MG tablet Take 500-1,000 mg by mouth every 4 (four) hours as needed for moderate pain.    Provider, Historical, Roberto [name]  aspirin  EC 81 MG tablet Take 81 mg by mouth as needed.    Provider, Historical, Roberto [name]  atropine 1 % ophthalmic solution Place 1 drop into the right eye daily.    Provider, Historical, Roberto [name]  brimonidine (ALPHAGAN) 0.2 % ophthalmic solution Place 1 drop into both eyes  in the morning and at bedtime. 12/07/21   Provider, Historical, Roberto [name]  cabergoline  (DOSTINEX ) 0.5 MG tablet Take 1 tablet 4 times a week on Monday, Wednesday and Friday right after dialysis and Saturdays. 11/25/23   Thapa, Sudan, Roberto [name]  calcitRIOL (ROCALTROL) 0.25 MCG capsule Take 0.25 mcg by mouth daily. 09/11/21   Provider, Historical, Roberto [name]  calcium acetate (PHOSLO) 667 MG capsule Take 667 mg by mouth 3 (three) times daily with meals. 06/01/23 11/15/24  Provider, Historical, Roberto [name]  cloNIDine  (CATAPRES  - DOSED IN MG/24 HR) 0.3 mg/24hr patch Place 0.3 mg onto the skin once a week.  Changes on Friday. Apply to left upper back.    Provider, Historical, Roberto [name]  cloNIDine  (CATAPRES ) 0.1 MG tablet Take 0.1 mg by mouth 3 (three) times daily as needed (Breakthrough hypertension).    Provider, Historical, Roberto [name]  dorzolamide-timolol (COSOPT) 2-0.5 % ophthalmic solution Place 1 drop into both eyes 2 (two) times daily. 06/24/21   Provider, Historical, Roberto [name]  hydrALAZINE  (APRESOLINE ) 50 MG tablet Take 50 mg by mouth in the morning and at bedtime. 05/27/22   Provider, Historical, Roberto [name]  IRON SUCROSE IV Iron Sucrose (Venofer) 07/22/23 11/09/24  Provider, Historical, Roberto [name]  Methoxy PEG-Epoetin  Beta (MIRCERA IJ) Mircera 11/25/23 11/23/24  Provider, Historical, Roberto [name]  Multiple Vitamins-Minerals (MULTIVITAL PO) Take 1 tablet by mouth daily.    Provider, Historical, Roberto [name]  Na Sulfate-K Sulfate-Mg Sulfate concentrate (SUPREP BOWEL PREP KIT) 17.5-3.13-1.6 GM/177ML SOLN Take 1 kit (354 mLs total) by mouth as directed. For colonoscopy prep 12/10/23   Kennedy-Smith, Colleen M, NP  nebivolol (BYSTOLIC) 10 MG tablet Take 10 mg by mouth daily. 04/27/23   Provider, Historical, Roberto [name]  NIFEdipine (ADALAT CC) 30 MG 24 hr tablet Take 30 mg by mouth daily. AM    Provider, Historical, Roberto [name]  olmesartan (BENICAR) 20 MG tablet Take 20 mg by mouth at bedtime. 03/09/23   Provider, Historical, Roberto [name]  timolol (BETIMOL) 0.25 % ophthalmic solution Place 1-2 drops into both eyes 2 (two) times daily.    Provider, Historical, Roberto [name]  torsemide (DEMADEX) 100 MG tablet Take 100 mg by mouth every morning. 02/11/23   Provider, Historical, Roberto [name]  Vitamin D , Ergocalciferol , (DRISDOL ) 1.25 MG (50000 UNIT) CAPS capsule TAKE 1 CAPSULE (50,000 UNITS TOTAL) BY MOUTH EVERY 7 (SEVEN) DAYS 02/03/24   Thapa, Sudan, Roberto [name]    No current facility-administered medications for this encounter.    Allergies as of 12/10/2023 - Review Complete 12/10/2023  Allergen Reaction Noted   Contrast media [iodinated contrast media] Rash 03/20/2019    Family History  Problem Relation Age of Onset    Diabetes Mother    Colon polyps Mother    Heart disease Mother    Hypertension Mother    Colon cancer Father    Kidney disease Maternal Grandmother    Diverticulitis Maternal Grandfather    Esophageal cancer Maternal Uncle    Diverticulitis Maternal Uncle     Social History   Socioeconomic History   Marital status: Married    Spouse name: Not on file   Number of children: 0   Years of education: Not on file   Highest education level: Not on file  Occupational History   Occupation: Roberto [name]  Tobacco Use   Smoking status: Never    Passive exposure: Never   Smokeless tobacco: Never  Vaping Use   Vaping status: Never Used  Substance and Sexual Activity   Alcohol use: No   Drug use: No   Sexual activity: Yes  Other Topics Concern  Not on file  Social History Narrative   Not on file   Social Drivers of Health   Financial Resource Strain: Not on file  Food Insecurity: Low Risk  (02/09/2024)   Received from [facility]   Hunger Vital Sign    Within the past 12 months, you worried that your food would run out before you got money to buy more: Never true    Within the past 12 months, the food you bought just didn't last and you didn't have money to get more. : Never true  Transportation Needs: No Transportation Needs (02/09/2024)   Received from [facility]    In the past 12 months, has lack of reliable transportation kept you from medical appointments, meetings, work or from getting things needed for daily living? : No  Physical Activity: Not on file  Stress: Not on file  Social Connections: Not on file  Intimate Partner Violence: Not on file    Review of Systems: All other review of systems negative except as mentioned in the HPI.  Physical Exam: Vital signs Wt 90.3 kg   BMI 27.77 kg/m   General:   Alert,  Well-developed, pleasant and cooperative in NAD Lungs:  Clear throughout to auscultation.   Heart:  Regular rate and rhythm Abdomen:   Soft, nontender and nondistended.   Neuro/Psych:  Alert and cooperative. Normal mood and affect. A and O x 3  Marcey Naval, Roberto [name] [facility]

## 2024-02-18 NOTE — Op Note (Signed)
 [facility] Patient Name: Roberto [name] Procedure Date: 02/18/2024 MRN: [id number] Attending MD: Elspeth SQUIBB. Leigh , MD, [id number] Date of Birth: [birth year] CSN: [id number] Age: 63 Admit Type: Outpatient Procedure:                Colonoscopy Indications:              High risk colon cancer surveillance: Personal                            history of colonic polyps - last exam 2018 with one                            small adenoma, family history of colon cancer Providers:                Elspeth P. Leigh, MD, Darleene Bare, RN, Curtistine Bishop, Technician Referring MD:              Medicines:                Monitored Anesthesia Care Complications:            No immediate complications. Estimated blood loss:                            None. Estimated Blood Loss:     Estimated blood loss: none. Procedure:                Pre-Anesthesia Assessment:                           - Prior to the procedure, a History and Physical                            was performed, and patient medications and                            allergies were reviewed. The patient's tolerance of                            previous anesthesia was also reviewed. The risks                            and benefits of the procedure and the sedation                            options and risks were discussed with the patient.                            All questions were answered, and informed consent                            was obtained. Prior Anticoagulants: The patient has  taken no anticoagulant or antiplatelet agents. ASA                            Grade Assessment: III - A patient with severe                            systemic disease. After reviewing the risks and                            benefits, the patient was deemed in satisfactory                            condition to undergo the procedure.                           After  obtaining informed consent, the colonoscope                            was passed under direct vision. Throughout the                            procedure, the patient's blood pressure, pulse, and                            oxygen saturations were monitored continuously. The                            [id number]-HQ190L ([id number]) Olympus colonoscope was                            introduced through the anus with the intention of                            advancing to the cecum. The scope was advanced to                            the hepatic flexure before the procedure was                            aborted. Medications were given. The colonoscopy                            was performed without difficulty. The patient                            tolerated the procedure well. The quality of the                            bowel preparation was inadequate. The rectum was                            photographed. Scope In: 9:34:58 AM Scope Out: 9:39:15 AM Total Procedure Duration: 0 hours 4 minutes 17 seconds  Findings:  The perianal and digital rectal examinations were normal.      A large amount of semi-liquid stool was found in the entire colon,       precluding visualization. It got progressively worse in the more       proximal colon to the point where it became evident it was not possible       to complete the exam or lavage the colon to achieve adequate views. The       procedure was aborted in the hepatic flexure.      The exam was otherwise without abnormality. Of what was visualized, no       obvious polyps seen. Impression:               - Preparation of the colon was inadequate -                            procedure aborted.                           - Stool in the entire examined colon. Moderate Sedation:      No moderate sedation, case performed with MAC Recommendation:           - Patient has a contact number available for                            emergencies. The signs and  symptoms of potential                            delayed complications were discussed with the                            patient. Return to normal activities tomorrow.                            Written discharge instructions were provided to the                            patient.                           - Resume previous diet.                           - Continue present medications.                           - Repeat colonoscopy because the bowel preparation                            was suboptimal. He will require a 2 day prep - will                            discuss options with him. Procedure Code(s):        --- Professional ---                           H9894, 53, Colorectal  cancer screening; colonoscopy                            on individual at high risk Diagnosis Code(s):        --- Professional ---                           Z86.010, Personal history of colonic polyps CPT copyright 2022 American Medical Association. All rights reserved. The codes documented in this report are preliminary and upon coder review may  be revised to meet current compliance requirements. Elspeth P. Cartel Mauss, MD 02/18/2024 9:46:36 AM This report has been signed electronically. Number of Addenda: 0

## 2024-02-18 NOTE — Op Note (Signed)
 [facility] Patient Name: Roberto [name] Procedure Date: 02/18/2024 MRN: [id number] Attending MD: Elspeth SQUIBB. Leigh , MD, [id number] Date of Birth: 02/07/1961 CSN: [id number] Age: 62 Admit Type: Outpatient Procedure:                Upper GI endoscopy Indications:              Dysphagia - first time EGD Providers:                Elspeth SQUIBB. Leigh, MD, Darleene Bare, RN, Curtistine Bishop, Technician Referring MD:              Medicines:                Monitored Anesthesia Care Complications:            No immediate complications. Estimated blood loss:                            Minimal. Estimated Blood Loss:     Estimated blood loss was minimal. Procedure:                Pre-Anesthesia Assessment:                           - Prior to the procedure, a History and Physical                            was performed, and patient medications and                            allergies were reviewed. The patient's tolerance of                            previous anesthesia was also reviewed. The risks                            and benefits of the procedure and the sedation                            options and risks were discussed with the patient.                            All questions were answered, and informed consent                            was obtained. Prior Anticoagulants: The patient has                            taken no anticoagulant or antiplatelet agents. ASA                            Grade Assessment: III - A patient with severe  systemic disease. After reviewing the risks and                            benefits, the patient was deemed in satisfactory                            condition to undergo the procedure.                           After obtaining informed consent, the endoscope was                            passed under direct vision. Throughout the                            procedure, the  patient's blood pressure, pulse, and                            oxygen saturations were monitored continuously. The                            [id number]-[id number] ([id number]) Olympus endoscope was introduced                            through the mouth, and advanced to the second part                            of duodenum. The upper GI endoscopy was                            accomplished without difficulty. The patient                            tolerated the procedure well. Scope In: Scope Out: Findings:      Esophagogastric landmarks were identified: the Z-line was found at 42       cm, the gastroesophageal junction was found at 42 cm and the upper       extent of the gastric folds was found at 44 cm from the incisors.      A 2 cm hiatal hernia was present.      A widely patent Schatzki ring was found at the gastroesophageal       junction. I do not think this is the cause of his dysphagia.      LA Grade A esophagitis was found at the GEJ.      The examined esophagus was rather tortuous, with stasis of secretions       thoughout the entire esophagus. This was lavaged extensively, no       stenosis / stricture. High suspicion for dysmotility.      The exam of the esophagus was otherwise normal.      Patchy moderate inflammation characterized by erythema, friability and       granularity was found in the gastric fundus and in the gastric body.       Biopsies were taken with a cold forceps from the antrum / body for  Helicobacter pylori testing.      The exam of the stomach was otherwise normal.      The examined duodenum was normal. Impression:               - Esophagogastric landmarks identified.                           - 2 cm hiatal hernia.                           - Widely patent Schatzki ring.                           - LA Grade A reflux esophagitis.                           - Tortuous esophagus with stasis of secretions                            thoughout the entire esophagus -  concerning for                            dysmotility                           - Normal esophagus otherwise.                           - Gastritis. Biopsied.                           - Normal examined duodenum. Moderate Sedation:      No moderate sedation, case performed with MAC Recommendation:           - Patient has a contact number available for                            emergencies. The signs and symptoms of potential                            delayed complications were discussed with the                            patient. Return to normal activities tomorrow.                            Written discharge instructions were provided to the                            patient.                           - Resume previous diet.                           - Continue present medications.                           -  Start protonix  40mg  / day for 30 days and then                            use PRN if it helps symptoms.                           - Recommend barium swallow with tablet to get gross                            sense of motility, see if dysphagia localizes to                            any particular location.                           - Await pathology results. Procedure Code(s):        --- Professional ---                           [phone], Esophagogastroduodenoscopy, flexible,                            transoral; with biopsy, single or multiple Diagnosis Code(s):        --- Professional ---                           K44.9, Diaphragmatic hernia without obstruction or                            gangrene                           K22.2, Esophageal obstruction                           K21.00, Gastro-esophageal reflux disease with                            esophagitis, without bleeding                           Q39.9, Congenital malformation of esophagus,                            unspecified                           K29.70, Gastritis, unspecified, without bleeding                            R13.10, Dysphagia, unspecified CPT copyright 2022 American Medical Association. All rights reserved. The codes documented in this report are preliminary and upon coder review may  be revised to meet current compliance requirements. Elspeth P. Daniele Dillow, MD 02/18/2024 10:02:21 AM This report has been signed electronically. Number of Addenda: 0

## 2024-02-18 NOTE — Anesthesia Postprocedure Evaluation (Signed)
 Anesthesia Post Note  Patient: Roberto Carliss [name], MD  Procedure(s) Performed: COLONOSCOPY EGD (ESOPHAGOGASTRODUODENOSCOPY)     Patient location during evaluation: PACU Anesthesia Type: MAC Level of consciousness: awake and alert Pain management: pain level controlled Vital Signs Assessment: post-procedure vital signs reviewed and stable Respiratory status: spontaneous breathing, nonlabored ventilation and respiratory function stable Cardiovascular status: stable and blood pressure returned to baseline Anesthetic complications: no   No notable events documented.  Last Vitals:  Vitals:   02/18/24 1000 02/18/24 1010  BP: (!) 152/60 (!) 168/46  Pulse: 88 86  Resp: (!) 22 18  Temp:    SpO2: 100% 99%    Last Pain:  Vitals:   02/18/24 1010  TempSrc:   PainSc: 0-No pain                 Debby FORBES Like

## 2024-02-18 NOTE — Transfer of Care (Signed)
 Immediate Anesthesia Transfer of Care Note  Patient: Roberto Carliss [name], MD  Procedure(s) Performed: COLONOSCOPY EGD (ESOPHAGOGASTRODUODENOSCOPY)  Patient Location: PACU  Anesthesia Type:MAC  Level of Consciousness: sedated, patient cooperative, and responds to stimulation  Airway & Oxygen Therapy: Patient Spontanous Breathing and Patient connected to face mask  Post-op Assessment: Report given to RN and Post -op Vital signs reviewed and stable  Post vital signs: Reviewed and stable  Last Vitals:  Vitals Value Taken Time  BP 167/115 02/18/24 09:54  Temp 36.3 C 02/18/24 09:46  Pulse 90 02/18/24 09:54  Resp 25 02/18/24 09:54  SpO2 99 % 02/18/24 09:54  Vitals shown include unfiled device data.  Last Pain:  Vitals:   02/18/24 0946  TempSrc: Temporal  PainSc: 0-No pain         Complications: No notable events documented.

## 2024-02-18 NOTE — Discharge Instructions (Signed)

## 2024-02-18 NOTE — Anesthesia Preprocedure Evaluation (Addendum)
 Anesthesia Evaluation  Patient identified by MRN, date of birth, ID band Patient awake    Reviewed: Allergy & Precautions, NPO status , Patient's Chart, lab work & pertinent test results, reviewed documented beta blocker date and time   History of Anesthesia Complications Negative for: history of anesthetic complications  Airway Mallampati: II  TM Distance: >3 FB Neck ROM: Full    Dental  (+) Dental Advisory Given   Pulmonary neg pulmonary ROS   Pulmonary exam normal        Cardiovascular hypertension, Pt. on medications and Pt. on home beta blockers Normal cardiovascular exam     Neuro/Psych negative neurological ROS  negative psych ROS   GI/Hepatic negative GI ROS, Neg liver ROS,,,  Endo/Other  diabetes, Type 2   Benign prolactinoma   Renal/GU ESRF and DialysisRenal disease     Musculoskeletal negative musculoskeletal ROS (+)    Abdominal   Peds  Hematology  (+) REFUSES BLOOD PRODUCTS  Anesthesia Other Findings Blind   Reproductive/Obstetrics                              Anesthesia Physical Anesthesia Plan  ASA: 3  Anesthesia Plan: MAC   Post-op Pain Management: Minimal or no pain anticipated   Induction:   PONV Risk Score and Plan: 1 and Propofol  infusion and Treatment may vary due to age or medical condition  Airway Management Planned: Nasal Cannula and Natural Airway  Additional Equipment: None  Intra-op Plan:   Post-operative Plan:   Informed Consent: I have reviewed the patients History and Physical, chart, labs and discussed the procedure including the risks, benefits and alternatives for the proposed anesthesia with the patient or authorized representative who has indicated his/her understanding and acceptance.       Plan Discussed with: CRNA and Anesthesiologist  Anesthesia Plan Comments:          Anesthesia Quick Evaluation

## 2024-02-19 ENCOUNTER — Encounter (HOSPITAL_COMMUNITY): Payer: Self-pay | Admitting: Gastroenterology

## 2024-02-19 LAB — SURGICAL PATHOLOGY

## 2024-02-20 ENCOUNTER — Ambulatory Visit: Payer: Self-pay | Admitting: Gastroenterology

## 2024-02-20 DIAGNOSIS — R1310 Dysphagia, unspecified: Secondary | ICD-10-CM

## 2024-02-24 ENCOUNTER — Other Ambulatory Visit

## 2024-03-08 NOTE — Telephone Encounter (Addendum)
 Order entered for Hem/Onc referral through [facility]. Pt is aware of this, and his change to active status, pending clearance. Pt will also request local referral from his Nephrologist; then follow through with whichever is scheduled first.  Orders     Future Labs/Procedures Expected by Expires   Ambulatory referral to Hematology  As directed 04/08/2025   Scheduling Instructions:   [facility] Transplant will schedule   Comments:   PreKidney Transplant Eval, Pancytopenia. Requires clearance for transplant, possibly BM bx per [facility]rologists.   Questions:     Are you referring for Leukemia?:    Referring for Transplant or Cell Therapy?:        Electronically signed by: Rosina LOISE Dasen, RN 03/08/2024 8:53 AM  ----- Message from Marca Vinson Blas, MD sent at 03/07/2024  2:23 PM EST ----- Looks like this is chronic and not sure why we have not sent him to Hem. Yes, we should probably make him inactive and get him evaluated. Thanks Marca Hadassah Vinson Blas, MD ----- Message ----- From: Rosina Nat Dasen, RN Sent: 03/07/2024   2:06 PM EST To: Marca Hadassah Vinson Blas, MD  He is active on the list, would you like to see him made inactive until clearance is obtained? ----- Message ----- From: Marca Hadassah Vinson Blas, MD Sent: 03/07/2024   1:25 PM EST To: Rosina Nat Dasen, RN  Patient is pancytopenic. He needs evaluation by Hem/onc if not done already. Thanks Marca Hadassah Vinson Blas, MD

## 2024-03-10 ENCOUNTER — Inpatient Hospital Stay: Attending: Hematology and Oncology | Admitting: Hematology and Oncology

## 2024-03-10 ENCOUNTER — Encounter: Payer: Self-pay | Admitting: Hematology and Oncology

## 2024-03-10 ENCOUNTER — Inpatient Hospital Stay

## 2024-03-10 VITALS — BP 160/63 | HR 67 | Temp 98.6°F | Resp 17

## 2024-03-10 DIAGNOSIS — R5383 Other fatigue: Secondary | ICD-10-CM

## 2024-03-10 DIAGNOSIS — I120 Hypertensive chronic kidney disease with stage 5 chronic kidney disease or end stage renal disease: Secondary | ICD-10-CM | POA: Diagnosis not present

## 2024-03-10 DIAGNOSIS — Z79899 Other long term (current) drug therapy: Secondary | ICD-10-CM | POA: Insufficient documentation

## 2024-03-10 DIAGNOSIS — N186 End stage renal disease: Secondary | ICD-10-CM

## 2024-03-10 DIAGNOSIS — D649 Anemia, unspecified: Secondary | ICD-10-CM

## 2024-03-10 DIAGNOSIS — R718 Other abnormality of red blood cells: Secondary | ICD-10-CM

## 2024-03-10 DIAGNOSIS — Z808 Family history of malignant neoplasm of other organs or systems: Secondary | ICD-10-CM | POA: Diagnosis not present

## 2024-03-10 DIAGNOSIS — D61818 Other pancytopenia: Secondary | ICD-10-CM

## 2024-03-10 DIAGNOSIS — Z83719 Family history of colon polyps, unspecified: Secondary | ICD-10-CM | POA: Insufficient documentation

## 2024-03-10 DIAGNOSIS — N1411 Contrast-induced nephropathy: Secondary | ICD-10-CM | POA: Diagnosis not present

## 2024-03-10 DIAGNOSIS — Z992 Dependence on renal dialysis: Secondary | ICD-10-CM

## 2024-03-10 DIAGNOSIS — Z800 Family history of malignant neoplasm of digestive organs: Secondary | ICD-10-CM | POA: Diagnosis not present

## 2024-03-10 LAB — CBC WITH DIFFERENTIAL/PLATELET
Abs Immature Granulocytes: 0.01 K/uL (ref 0.00–0.07)
Basophils Absolute: 0 K/uL (ref 0.0–0.1)
Basophils Relative: 1 %
Eosinophils Absolute: 0.1 K/uL (ref 0.0–0.5)
Eosinophils Relative: 3 %
HCT: 35.5 % — ABNORMAL LOW (ref 39.0–52.0)
Hemoglobin: 11.5 g/dL — ABNORMAL LOW (ref 13.0–17.0)
Immature Granulocytes: 0 %
Lymphocytes Relative: 28 %
Lymphs Abs: 0.8 K/uL (ref 0.7–4.0)
MCH: 33.3 pg (ref 26.0–34.0)
MCHC: 32.4 g/dL (ref 30.0–36.0)
MCV: 102.9 fL — ABNORMAL HIGH (ref 80.0–100.0)
Monocytes Absolute: 0.3 K/uL (ref 0.1–1.0)
Monocytes Relative: 9 %
Neutro Abs: 1.7 K/uL (ref 1.7–7.7)
Neutrophils Relative %: 59 %
Platelets: 146 K/uL — ABNORMAL LOW (ref 150–400)
RBC: 3.45 MIL/uL — ABNORMAL LOW (ref 4.22–5.81)
RDW: 14.3 % (ref 11.5–15.5)
Smear Review: NORMAL
WBC: 3 K/uL — ABNORMAL LOW (ref 4.0–10.5)
nRBC: 0 % (ref 0.0–0.2)

## 2024-03-10 LAB — CMP (CANCER CENTER ONLY)
ALT: 7 U/L (ref 0–44)
AST: 18 U/L (ref 15–41)
Albumin: 4.3 g/dL (ref 3.5–5.0)
Alkaline Phosphatase: 105 U/L (ref 38–126)
Anion gap: 14 (ref 5–15)
BUN: 56 mg/dL — ABNORMAL HIGH (ref 8–23)
CO2: 27 mmol/L (ref 22–32)
Calcium: 9.5 mg/dL (ref 8.9–10.3)
Chloride: 99 mmol/L (ref 98–111)
Creatinine: 9.84 mg/dL (ref 0.61–1.24)
GFR, Estimated: 5 mL/min — ABNORMAL LOW (ref >60)
Glucose, Bld: 152 mg/dL — ABNORMAL HIGH (ref 70–99)
Potassium: 4.5 mmol/L (ref 3.5–5.1)
Sodium: 140 mmol/L (ref 135–145)
Total Bilirubin: 0.5 mg/dL (ref 0.0–1.2)
Total Protein: 7.7 g/dL (ref 6.5–8.1)

## 2024-03-10 LAB — TSH: TSH: 0.323 u[IU]/mL — ABNORMAL LOW (ref 0.350–4.500)

## 2024-03-10 LAB — HEPATITIS PANEL, ACUTE
HCV Ab: NONREACTIVE
Hep A IgM: NONREACTIVE
Hep B C IgM: NONREACTIVE
Hepatitis B Surface Ag: NONREACTIVE

## 2024-03-10 LAB — FERRITIN: Ferritin: 1093 ng/mL — ABNORMAL HIGH (ref 24–336)

## 2024-03-10 LAB — VITAMIN B12: Vitamin B-12: 435 pg/mL (ref 180–914)

## 2024-03-10 NOTE — Progress Notes (Signed)
  [facility] CONSULT NOTE  Patient Care Team: Marlee Bernardino NOVAK, [name] as PCP - General (Nephrology)  CHIEF COMPLAINTS/PURPOSE OF CONSULTATION:  Pancytopenia,  ASSESSMENT & PLAN:   Assessment and Plan Assessment & Plan Pancytopenia Mild pancytopenia with gradual decline in all cell lines over years, asymptomatic. Etiology unclear, likely multifactorial including chronic kidney disease, medication effects, and prolactinoma. No evidence of autoimmune disease or secondary causes. Further evaluation needed for renal transplant clearance. - Ordered repeat blood counts, peripheral smear, B12, folic acid , ANA, and additional labs for pancytopenia. - Ordered flow cytometry and prolactin level. - Discussed potential need for bone marrow biopsy if initial workup is unrevealing, prior to renal transplant. - Planned to communicate results next week and arrange bone marrow biopsy after the New Year if indicated.   HISTORY OF PRESENTING ILLNESS:  Roberto [name] 63 y.o. male is here because of Pancytopenia.  Discussed the use of AI scribe software for clinical note transcription with the patient, who gave verbal consent to proceed.  History of Present Illness [name], [name] Dr. [name] is a 63 year old male with end-stage renal disease on dialysis and chronic pancytopenia who presents for hematology evaluation prior to planned renal transplant due to persistent cytopenias.  He has experienced mild pancytopenia for several years, with a gradual decline in all three hematopoietic cell lines over the past two years. Recent laboratory studies from March 03, 2024, show WBC 2.8 x10^3/L, hemoglobin 12 g/dL, and platelets 893 k89^6/O. Absolute neutrophil count is 1,600/L. Historical CBCs from 2022-2023 demonstrated WBCs of 3.8-3.9 x10^3/L and platelets 143-147 x10^3/L, confirming a downward trend. He remains asymptomatic, with no history of bleeding, bruising, or infectious  complications, and has not required transfusions.  He has end-stage renal disease attributed to hypertension and contrast-induced nephropathy following bilateral glaucoma surgery in 2019, currently managed with dialysis. Hemoglobin improved from a nadir of 9 g/dL to 12 g/dL with erythropoietin  therapy administered during dialysis, most recently two days prior to this visit. He undergoes regular iron and ferritin monitoring. There is no history of hepatitis, blood transfusions, or liver disease. He does not use alcohol or tobacco.  His medications include hydralazine  (for approximately two years), clonidine  (since 2020), and cabergoline  (since 2019 for prolactinoma). Cabergoline  dosing was increased from once weekly to four times weekly in October 2024 due to rising prolactin levels after initiation of dialysis. He notes nipple tenderness associated with elevated prolactin. Last prolactin was 26 ng/mL, previously as low as 2 ng/mL prior to October 2024. There is no personal or family history of autoimmune disease, and no symptoms suggestive of connective tissue disease.  All other systems were reviewed with the patient and are negative.  MEDICAL HISTORY:  Past Medical History:  Diagnosis Date   Anemia    Chronic kidney disease    Stage 4   Detached retina    Diabetes mellitus without complication (HCC)    pt states his A1C was has been in normal range for several years now.   H/O hypogonadism    History of kidney stones    passed   History of pituitary tumor    Hypertension    Prolactinoma, benign (HCC)    Vision loss, bilateral     SURGICAL HISTORY: Past Surgical History:  Procedure Laterality Date   A/V FISTULAGRAM N/A 04/27/2023   Procedure: A/V Fistulagram;  Surgeon: Melia Lynwood ORN, [name];  Location: [facility] INVASIVE CV LAB;  Service: Cardiovascular;  Laterality: N/A;   AV FISTULA PLACEMENT  Left 12/25/2021   Procedure: LEFT RADIAL CEPHALIC FISTULA CREATION;  Surgeon: Serene Gaile ORN, [name];   Location: [facility] OR;  Service: Vascular;  Laterality: Left;   AV FISTULA PLACEMENT Left 03/26/2022   Procedure: LEFT UPPER EXTREMITY BRACHIOCEPHALIC ARTERIOVENOUS (AV) FISTULA CREATION;  Surgeon: Serene Gaile ORN, [name];  Location: [facility] OR;  Service: Vascular;  Laterality: Left;   BASCILIC VEIN TRANSPOSITION Left 07/18/2022   Procedure: LEFT SECOND STAGE BASILIC VEIN TRANSPOSITION;  Surgeon: Serene Gaile ORN, [name];  Location: [facility] OR;  Service: Vascular;  Laterality: Left;   COLONOSCOPY     COLONOSCOPY N/A 02/18/2024   Procedure: COLONOSCOPY;  Surgeon: Leigh Elspeth SQUIBB, [name];  Location: [facility];  Service: Gastroenterology;  Laterality: N/A;   ESOPHAGOGASTRODUODENOSCOPY N/A 02/18/2024   Procedure: EGD (ESOPHAGOGASTRODUODENOSCOPY);  Surgeon: Leigh Elspeth SQUIBB, [name];  Location: [facility];  Service: Gastroenterology;  Laterality: N/A;   EYE SURGERY Bilateral    retina surgery - 2018?   KIDNEY STONE SURGERY  1987   PERIPHERAL VASCULAR BALLOON ANGIOPLASTY Left 04/27/2023   Procedure: PERIPHERAL VASCULAR BALLOON ANGIOPLASTY;  Surgeon: Melia Lynwood ORN, [name];  Location: [facility] INVASIVE CV LAB;  Service: Cardiovascular;  Laterality: Left;  80% outflow swing site   PERIPHERAL VASCULAR THROMBECTOMY Left 04/27/2023   Procedure: PERIPHERAL VASCULAR THROMBECTOMY;  Surgeon: Melia Lynwood ORN, [name];  Location: [facility] INVASIVE CV LAB;  Service: Cardiovascular;  Laterality: Left;  80% outflow swing site    SOCIAL HISTORY: Social History   Socioeconomic History   Marital status: Married    Spouse name: Not on file   Number of children: 0   Years of education: Not on file   Highest education level: Not on file  Occupational History   Occupation: [name]  Tobacco Use   Smoking status: Never    Passive exposure: Never   Smokeless tobacco: Never  Vaping Use   Vaping status: Never Used  Substance and Sexual Activity   Alcohol use: No   Drug use: No   Sexual activity: Yes  Other Topics Concern   Not on file  Social History  Narrative   Not on file   Social Drivers of Health   Tobacco Use: Low Risk (02/18/2024)   Patient History    Smoking Tobacco Use: Never    Smokeless Tobacco Use: Never    Passive Exposure: Never  Financial Resource Strain: Not on file  Food Insecurity: Low Risk (02/09/2024)   Received from [facility]   Epic    Within the past 12 months, you worried that your food would run out before you got money to buy more: Never true    Within the past 12 months, the food you bought just didn't last and you didn't have money to get more. : Never true  Transportation Needs: No Transportation Needs (02/09/2024)   Received from [facility]    In the past 12 months, has lack of reliable transportation kept you from medical appointments, meetings, work or from getting things needed for daily living? : No  Physical Activity: Not on file  Stress: Not on file  Social Connections: Not on file  Intimate Partner Violence: Not on file  Depression (EYV7-0): Not on file  Alcohol Screen: Not on file  Housing: Low Risk (02/09/2024)   Received from [facility]   Epic    What is your living situation today?: I have a steady place to live    Think about the place you live. Do you have problems with any of  the following? Choose all that apply:: None/None on this list  Utilities: Low Risk (02/09/2024)   Received from [facility]   Utilities    In the past 12 months has the electric, gas, oil, or water company threatened to shut off services in your home? : No  Health Literacy: Not on file    FAMILY HISTORY: Family History  Problem Relation Age of Onset   Diabetes Mother    Colon polyps Mother    Heart disease Mother    Hypertension Mother    Colon cancer Father    Kidney disease Maternal Grandmother    Diverticulitis Maternal Grandfather    Esophageal cancer Maternal Uncle    Diverticulitis Maternal Uncle     ALLERGIES:  is allergic to contrast media [iodinated contrast  media].  MEDICATIONS:  Current Outpatient Medications  Medication Sig Dispense Refill   acetaminophen  (TYLENOL ) 500 MG tablet Take 500-1,000 mg by mouth every 4 (four) hours as needed for moderate pain.     aspirin  EC 81 MG tablet Take 81 mg by mouth as needed.     atropine 1 % ophthalmic solution Place 1 drop into the right eye daily.     brimonidine (ALPHAGAN) 0.2 % ophthalmic solution Place 1 drop into both eyes in the morning and at bedtime.     cabergoline  (DOSTINEX ) 0.5 MG tablet Take 1 tablet 4 times a week on Monday, Wednesday and Friday right after dialysis and Saturdays. 48 tablet 3   calcitRIOL (ROCALTROL) 0.25 MCG capsule Take 0.25 mcg by mouth daily.     calcium acetate (PHOSLO) 667 MG capsule Take 667 mg by mouth 3 (three) times daily with meals.     cloNIDine  (CATAPRES  - DOSED IN MG/24 HR) 0.3 mg/24hr patch Place 0.3 mg onto the skin once a week. Changes on Friday. Apply to left upper back.     cloNIDine  (CATAPRES ) 0.1 MG tablet Take 0.1 mg by mouth 3 (three) times daily as needed (Breakthrough hypertension).     dorzolamide-timolol (COSOPT) 2-0.5 % ophthalmic solution Place 1 drop into both eyes 2 (two) times daily.     hydrALAZINE  (APRESOLINE ) 50 MG tablet Take 50 mg by mouth in the morning and at bedtime.     IRON SUCROSE IV Iron Sucrose (Venofer)     Methoxy PEG-Epoetin  Beta (MIRCERA IJ) Mircera     Multiple Vitamins-Minerals (MULTIVITAL PO) Take 1 tablet by mouth daily.     Na Sulfate-K Sulfate-Mg Sulfate concentrate (SUPREP BOWEL PREP KIT) 17.5-3.13-1.6 GM/177ML SOLN Take 1 kit (354 mLs total) by mouth as directed. For colonoscopy prep 354 mL 0   nebivolol (BYSTOLIC) 10 MG tablet Take 10 mg by mouth daily.     NIFEdipine (ADALAT CC) 30 MG 24 hr tablet Take 30 mg by mouth daily. AM     olmesartan (BENICAR) 20 MG tablet Take 20 mg by mouth at bedtime.     pantoprazole  (PROTONIX ) 40 MG tablet Take 1 tablet (40 mg total) by mouth daily for 180 doses. 90 tablet 1   timolol  (BETIMOL) 0.25 % ophthalmic solution Place 1-2 drops into both eyes 2 (two) times daily.     torsemide (DEMADEX) 100 MG tablet Take 100 mg by mouth every morning.     Vitamin D , Ergocalciferol , (DRISDOL ) 1.25 MG (50000 UNIT) CAPS capsule TAKE 1 CAPSULE (50,000 UNITS TOTAL) BY MOUTH EVERY 7 (SEVEN) DAYS 12 capsule 2   No current facility-administered medications for this visit.     PHYSICAL EXAMINATION: ECOG PERFORMANCE STATUS:  0 - Asymptomatic  Vitals:   03/10/24 1433  BP: (!) 160/63  Pulse: 67  Resp: 17  Temp: 98.6 F (37 C)  SpO2: 99%   Filed Weights    GENERAL:alert, no distress and comfortable, came in a wheel chair.   LABORATORY DATA:  I have reviewed the data as listed Lab Results  Component Value Date   WBC 2.9 (L) 01/02/2024   HGB 11.9 (L) 02/18/2024   HCT 35.0 (L) 02/18/2024   MCV 102.2 (H) 01/02/2024   PLT 152 01/02/2024     Chemistry      Component Value Date/Time   NA 138 02/18/2024 0831   NA 140 03/18/2019 1625   K 3.8 02/18/2024 0831   CL 97 (L) 02/18/2024 0831   CO2 28 01/02/2024 1440   BUN 39 (H) 02/18/2024 0831   BUN 40 (H) 03/18/2019 1625   CREATININE 7.10 (H) 02/18/2024 0831      Component Value Date/Time   CALCIUM 8.6 (L) 01/02/2024 1440   CALCIUM 8.6 12/18/2022 1132   ALKPHOS 86 01/02/2024 1440   AST 20 01/02/2024 1440   ALT 12 01/02/2024 1440   BILITOT 0.7 01/02/2024 1440   BILITOT 0.4 03/18/2019 1625       RADIOGRAPHIC STUDIES: I have personally reviewed the radiological images as listed and agreed with the findings in the report. No results found.  All questions were answered. The patient knows to call the clinic with any problems, questions or concerns. I spent 45 minutes in the care of this patient including H and P, review of records, counseling and coordination of care.     Amber Stalls, [name] 03/10/2024 2:41 PM

## 2024-03-11 ENCOUNTER — Telehealth: Payer: Self-pay | Admitting: Hematology and Oncology

## 2024-03-11 ENCOUNTER — Other Ambulatory Visit: Payer: Self-pay | Admitting: Nurse Practitioner

## 2024-03-11 LAB — FOLATE RBC
Folate, Hemolysate: 532 ng/mL
Folate, RBC: 1520 ng/mL (ref >498)
Hematocrit: 35 % — ABNORMAL LOW (ref 37.5–51.0)

## 2024-03-11 LAB — SURGICAL PATHOLOGY

## 2024-03-11 LAB — PROLACTIN: Prolactin: 42.3 ng/mL — ABNORMAL HIGH (ref 3.6–25.2)

## 2024-03-11 NOTE — Telephone Encounter (Signed)
 I mailed an appointment reminder for telephone visit on 03/18/2024 due to mailbox being full.

## 2024-03-12 ENCOUNTER — Ambulatory Visit: Payer: Self-pay | Admitting: Hematology and Oncology

## 2024-03-12 LAB — ANTINUCLEAR ANTIBODIES, IFA: ANA Ab, IFA: NEGATIVE

## 2024-03-15 ENCOUNTER — Ambulatory Visit (HOSPITAL_COMMUNITY)

## 2024-03-16 ENCOUNTER — Encounter (HOSPITAL_COMMUNITY): Payer: Self-pay

## 2024-03-16 ENCOUNTER — Other Ambulatory Visit: Payer: Self-pay | Admitting: Hematology and Oncology

## 2024-03-16 DIAGNOSIS — D61818 Other pancytopenia: Secondary | ICD-10-CM

## 2024-03-16 LAB — FLOW CYTOMETRY

## 2024-03-16 NOTE — Telephone Encounter (Signed)
-----   Message from Amber Stalls, MD sent at 03/16/2024  1:21 PM EST ----- Regarding: RE: Please contact pt Yes unless we do a marrow and provide clearance, he may not be able to proceed with renal transplant.  Leita, can we have this arrange early Jan.  Thanks, ----- Message ----- From: Delores Leita MATSU, LPN Sent: [id number]  12:00 PM EST To: Amber Stalls, MD; Sudan Thapa, MD; [facility] Subject: FW: Please contact pt                          Hi Dr Mercie,  [facility] pt, Dr Carlin [name] lab results.  Dr Stalls, are we to move forward with bmbx?  Lel[place], LPN ----- Message ----- From: Gina Nena HERO, RN Sent: 03/15/2024   5:17 PM EST To: Amber Stalls, MD; [facility] Subject: Please contact pt                              Labs printed. No endocrinologist noted in patient's care team. Labs w/fax cover sheet on nurse desk. Attempted contacting patient - no answer and no voice mail.  Please reach out to him tomorrow.  Thanks  ----- Message ----- From: Stalls Amber, MD Sent: 03/12/2024  11:00 AM EST To: [facility]  Patient wanted to know his prolactin level, please let him know. He has an endocrinologist, please fax it to them as well.

## 2024-03-16 NOTE — Telephone Encounter (Signed)
 Called pt and gave info per Dr Loretha and Dr Mercie. Made aware that scheduling will be in touch to have BMBX scheduled in January as well as MD follow up to review results.   Also informed pt Dr Mercie reviewed results and would like to repeat in 4-6 weeks. He will see pt in Feb. His office will contact pt.   Pt is aware and verbalized thanks and understanding.

## 2024-03-18 ENCOUNTER — Inpatient Hospital Stay: Admitting: Hematology and Oncology

## 2024-03-18 DIAGNOSIS — D61818 Other pancytopenia: Secondary | ICD-10-CM

## 2024-03-18 NOTE — Progress Notes (Signed)
 I tried calling both of his numbers, no answer. I will see him as scheduled on 04/06/2024

## 2024-03-22 ENCOUNTER — Other Ambulatory Visit: Payer: Self-pay | Admitting: Radiology

## 2024-03-22 DIAGNOSIS — D61818 Other pancytopenia: Secondary | ICD-10-CM

## 2024-03-22 NOTE — H&P (Incomplete)
 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 *** Patient is Full Code  Past Medical History:  Diagnosis Date   Anemia    Chronic kidney disease    Stage 4   Detached retina    Diabetes mellitus without complication (HCC)    pt states his A1C was has been in normal range for several years now.   H/O hypogonadism    History of kidney stones    passed   History of pituitary tumor    Hypertension    Prolactinoma, benign (HCC)    Vision loss, bilateral     Past Surgical History:  Procedure Laterality Date   A/V FISTULAGRAM N/A 04/27/2023   Procedure: A/V Fistulagram;  Surgeon: Melia Lynwood ORN, MD;  Location: [facility] INVASIVE CV LAB;  Service: Cardiovascular;  Laterality: N/A;   AV FISTULA PLACEMENT Left 12/25/2021   Procedure: LEFT RADIAL CEPHALIC FISTULA CREATION;  Surgeon: Serene Gaile ORN, MD;  Location: [facility] OR;  Service: Vascular;  Laterality: Left;   AV FISTULA PLACEMENT Left 03/26/2022   Procedure: LEFT UPPER EXTREMITY BRACHIOCEPHALIC ARTERIOVENOUS (AV) FISTULA CREATION;  Surgeon: Serene Gaile ORN, MD;  Location: [facility] OR;  Service: Vascular;  Laterality: Left;   BASCILIC VEIN TRANSPOSITION Left 07/18/2022   Procedure: LEFT SECOND STAGE BASILIC VEIN TRANSPOSITION;  Surgeon: Serene Gaile ORN, MD;  Location: [facility] OR;  Service: Vascular;  Laterality: Left;   COLONOSCOPY     COLONOSCOPY N/A 02/18/2024    Procedure: COLONOSCOPY;  Surgeon: Leigh Elspeth SQUIBB, MD;  Location: [facility];  Service: Gastroenterology;  Laterality: N/A;   ESOPHAGOGASTRODUODENOSCOPY N/A 02/18/2024   Procedure: EGD (ESOPHAGOGASTRODUODENOSCOPY);  Surgeon: Leigh Elspeth SQUIBB, MD;  Location: [facility];  Service: Gastroenterology;  Laterality: N/A;   EYE SURGERY Bilateral    retina surgery - 2018?   KIDNEY STONE SURGERY  1987   PERIPHERAL VASCULAR BALLOON ANGIOPLASTY Left 04/27/2023   Procedure: PERIPHERAL VASCULAR BALLOON ANGIOPLASTY;  Surgeon: Melia Lynwood ORN, MD;  Location: [facility] INVASIVE CV LAB;  Service: Cardiovascular;  Laterality: Left;  80% outflow swing site   PERIPHERAL VASCULAR THROMBECTOMY Left 04/27/2023   Procedure: PERIPHERAL VASCULAR THROMBECTOMY;  Surgeon: Melia Lynwood ORN, MD;  Location: [facility] INVASIVE CV LAB;  Service: Cardiovascular;  Laterality: Left;  80% outflow swing site    Allergies: Contrast media [iodinated contrast media]  Medications: Prior to Admission medications  Medication Sig Start Date End Date Taking? Authorizing Provider  acetaminophen  (TYLENOL ) 500 MG tablet Take 500-1,000 mg by mouth every 4 (four) hours as needed for moderate pain.    [provider]  aspirin  EC 81 MG tablet Take 81 mg by mouth as needed.    [provider]  atropine 1 % ophthalmic solution Place 1 drop into the right eye daily.    [provider]  brimonidine (ALPHAGAN) 0.2 % ophthalmic solution Place 1 drop into both eyes in the morning and at bedtime. 12/07/21   [provider]  cabergoline  (DOSTINEX ) 0.5 MG tablet Take 1 tablet 4  times a week on Monday, Wednesday and Friday right after dialysis and Saturdays. 11/25/23   Thapa, Sudan, MD  calcitRIOL (ROCALTROL) 0.25 MCG capsule Take 0.25 mcg by mouth daily. 09/11/21   [provider]  calcium acetate (PHOSLO) 667 MG capsule Take 667 mg by mouth 3 (three) times daily with meals. 06/01/23 11/15/24  [provider]   cloNIDine  (CATAPRES  - DOSED IN MG/24 HR) 0.3 mg/24hr patch Place 0.3 mg onto the skin once a week. Changes on Friday. Apply to left upper back.    [provider]  cloNIDine  (CATAPRES ) 0.1 MG tablet Take 0.1 mg by mouth 3 (three) times daily as needed (Breakthrough hypertension).    [provider]  dorzolamide-timolol (COSOPT) 2-0.5 % ophthalmic solution Place 1 drop into both eyes 2 (two) times daily. 06/24/21   [provider]  hydrALAZINE  (APRESOLINE ) 50 MG tablet Take 50 mg by mouth in the morning and at bedtime. 05/27/22   [provider]  IRON SUCROSE IV Iron Sucrose (Venofer) 07/22/23 11/09/24  [provider]  Methoxy PEG-Epoetin  Beta (MIRCERA IJ) Mircera 11/25/23 11/23/24  [provider]  Multiple Vitamins-Minerals (MULTIVITAL PO) Take 1 tablet by mouth daily.    [provider]  Na Sulfate-K Sulfate-Mg Sulfate concentrate (SUPREP) 17.5-3.13-1.6 GM/177ML SOLN TAKE 1 KIT (354 MLS TOTAL) BY MOUTH AS DIRECTED. FOR COLONOSCOPY PREP 03/14/24   Kennedy-Smith, Colleen M, NP  nebivolol (BYSTOLIC) 10 MG tablet Take 10 mg by mouth daily. 04/27/23   [provider]  NIFEdipine (ADALAT CC) 30 MG 24 hr tablet Take 30 mg by mouth daily. AM    [provider]  olmesartan (BENICAR) 20 MG tablet Take 20 mg by mouth at bedtime. 03/09/23   [provider]  pantoprazole  (PROTONIX ) 40 MG tablet Take 1 tablet (40 mg total) by mouth daily for 180 doses. 02/18/24 08/16/24  Armbruster, Elspeth SQUIBB, MD  timolol (BETIMOL) 0.25 % ophthalmic solution Place 1-2 drops into both eyes 2 (two) times daily.    [provider]  torsemide (DEMADEX) 100 MG tablet Take 100 mg by mouth every morning. 02/11/23   [provider]  Vitamin D , Ergocalciferol , (DRISDOL ) 1.25 MG (50000 UNIT) CAPS capsule TAKE 1 CAPSULE (50,000 UNITS TOTAL) BY MOUTH EVERY 7 (SEVEN) DAYS 02/03/24   Thapa, Sudan, MD     Family History  Problem Relation Age  of Onset   Diabetes Mother    Colon polyps Mother    Heart disease Mother    Hypertension Mother    Colon cancer Father    Kidney disease Maternal Grandmother    Diverticulitis Maternal Grandfather    Esophageal cancer Maternal Uncle    Diverticulitis Maternal Uncle     Social History   Socioeconomic History   Marital status: Married    Spouse name: Not on file   Number of children: 0   Years of education: Not on file   Highest education level: Not on file  Occupational History   Occupation: MD  Tobacco Use   Smoking status: Never    Passive exposure: Never   Smokeless tobacco: Never  Vaping Use   Vaping status: Never Used  Substance and Sexual Activity   Alcohol use: No   Drug use: No   Sexual activity: Yes  Other Topics Concern   Not on file  Social History Narrative   Not on file   Social Drivers of Health   Tobacco Use: Low Risk (03/10/2024)   Patient History    Smoking  Tobacco Use: Never    Smokeless Tobacco Use: Never    Passive Exposure: Never  Financial Resource Strain: Not on file  Food Insecurity: Low Risk (02/09/2024)   Received from [facility]   Epic    Within the past 12 months, you worried that your food would run out before you got money to buy more: Never true    Within the past 12 months, the food you bought just didn't last and you didn't have money to get more. : Never true  Transportation Needs: No Transportation Needs (02/09/2024)   Received from [facility]    In the past 12 months, has lack of reliable transportation kept you from medical appointments, meetings, work or from getting things needed for daily living? : No  Physical Activity: Not on file  Stress: Not on file  Social Connections: Not on file  Depression (EYV7-0): Not on file  Alcohol Screen: Not on file  Housing: Low Risk (02/09/2024)   Received from [facility]   Epic    What is your living situation today?: I have a steady place to live     Think about the place you live. Do you have problems with any of the following? Choose all that apply:: None/None on this list  Utilities: Low Risk (02/09/2024)   Received from [facility]   Utilities    In the past 12 months has the electric, gas, oil, or water company threatened to shut off services in your home? : No  Health Literacy: Not on file       Review of Systems  Vital Signs:   Advance Care Plan: no documents on file    Physical Exam  Imaging: No results found.  Labs:  CBC: Recent Labs    01/02/24 1440 02/18/24 0831 03/10/24 1504 03/10/24 1505  WBC 2.9*  --  3.0*  --   HGB 8.9* 11.9* 11.5*  --   HCT 28.4* 35.0* 35.5* 35.0*  PLT 152  --  146*  --     COAGS: No results for input(s): INR, APTT in the last 8760 hours.  BMP: Recent Labs    01/02/24 1440 02/18/24 0831 03/10/24 1504  NA 135 138 140  K 3.5 3.8 4.5  CL 95* 97* 99  CO2 28  --  27  GLUCOSE 170* 125* 152*  BUN 41* 39* 56*  CALCIUM 8.6*  --  9.5  CREATININE 6.25* 7.10* 9.84*  GFRNONAA 9*  --  5*    LIVER FUNCTION TESTS: Recent Labs    01/02/24 1440 03/10/24 1504  BILITOT 0.7 0.5  AST 20 18  ALT 12 7  ALKPHOS 86 105  PROT 6.9 7.7  ALBUMIN 3.4* 4.3    TUMOR MARKERS: No results for input(s): AFPTM, CEA, CA199, CHROMGRNA in the last 8760 hours.  Assessment and Plan: 63 y.o. male  with with past medical history significant for diabetes, nephrolithiasis, benign prolactinoma, hypertension, vision loss, end-stage renal disease on dialysis and chronic pancytopenia of uncertain etiology who presents today for image guided bone marrow biopsy prior to planned renal transplant .Risks and benefits of procedure was discussed with the patient  including, but not limited to bleeding, infection, damage to adjacent structures or low yield requiring additional tests.  All of the questions were answered and there is agreement to proceed.  Consent signed and in chart.  Patient  known to IR team for random renal biopsy in 2018   Thank you for allowing our service  to participate in Roberto Carliss [name], MD 's care.  Electronically Signed: D. Franky Rakers, PA-C   03/22/2024, 4:23 PM      I spent a total of 20 minutes    in face to face in clinical consultation, greater than 50% of which was counseling/coordinating care for image guided bone marrow biopsy   "

## 2024-03-23 ENCOUNTER — Other Ambulatory Visit: Payer: Self-pay

## 2024-03-23 ENCOUNTER — Ambulatory Visit (HOSPITAL_COMMUNITY)
Admission: RE | Admit: 2024-03-23 | Discharge: 2024-03-23 | Disposition: A | Source: Ambulatory Visit | Attending: Hematology and Oncology

## 2024-03-23 ENCOUNTER — Ambulatory Visit (HOSPITAL_COMMUNITY)
Admission: RE | Admit: 2024-03-23 | Discharge: 2024-03-23 | Disposition: A | Discharge status: Home / Self Care | Source: Ambulatory Visit | Attending: Hematology and Oncology | Admitting: Hematology and Oncology

## 2024-03-23 ENCOUNTER — Encounter (HOSPITAL_COMMUNITY): Payer: Self-pay

## 2024-03-23 DIAGNOSIS — Z86018 Personal history of other benign neoplasm: Secondary | ICD-10-CM | POA: Diagnosis not present

## 2024-03-23 DIAGNOSIS — Z79899 Other long term (current) drug therapy: Secondary | ICD-10-CM | POA: Diagnosis not present

## 2024-03-23 DIAGNOSIS — D61818 Other pancytopenia: Secondary | ICD-10-CM

## 2024-03-23 DIAGNOSIS — R059 Cough, unspecified: Secondary | ICD-10-CM | POA: Insufficient documentation

## 2024-03-23 DIAGNOSIS — Z992 Dependence on renal dialysis: Secondary | ICD-10-CM | POA: Diagnosis not present

## 2024-03-23 DIAGNOSIS — N186 End stage renal disease: Secondary | ICD-10-CM | POA: Insufficient documentation

## 2024-03-23 DIAGNOSIS — I120 Hypertensive chronic kidney disease with stage 5 chronic kidney disease or end stage renal disease: Secondary | ICD-10-CM | POA: Insufficient documentation

## 2024-03-23 DIAGNOSIS — E1122 Type 2 diabetes mellitus with diabetic chronic kidney disease: Secondary | ICD-10-CM | POA: Diagnosis not present

## 2024-03-23 LAB — CBC WITH DIFFERENTIAL/PLATELET
Abs Immature Granulocytes: 0.01 K/uL (ref 0.00–0.07)
Basophils Absolute: 0 K/uL (ref 0.0–0.1)
Basophils Relative: 0 %
Eosinophils Absolute: 0.1 K/uL (ref 0.0–0.5)
Eosinophils Relative: 4 %
HCT: 35.5 % — ABNORMAL LOW (ref 39.0–52.0)
Hemoglobin: 11 g/dL — ABNORMAL LOW (ref 13.0–17.0)
Immature Granulocytes: 0 %
Lymphocytes Relative: 24 %
Lymphs Abs: 0.8 K/uL (ref 0.7–4.0)
MCH: 32.4 pg (ref 26.0–34.0)
MCHC: 31 g/dL (ref 30.0–36.0)
MCV: 104.7 fL — ABNORMAL HIGH (ref 80.0–100.0)
Monocytes Absolute: 0.5 K/uL (ref 0.1–1.0)
Monocytes Relative: 14 %
Neutro Abs: 1.9 K/uL (ref 1.7–7.7)
Neutrophils Relative %: 58 %
Platelets: 130 K/uL — ABNORMAL LOW (ref 150–400)
RBC: 3.39 MIL/uL — ABNORMAL LOW (ref 4.22–5.81)
RDW: 13.2 % (ref 11.5–15.5)
WBC: 3.3 K/uL — ABNORMAL LOW (ref 4.0–10.5)
nRBC: 0 % (ref 0.0–0.2)

## 2024-03-23 MED ORDER — FENTANYL CITRATE (PF) 100 MCG/2ML IJ SOLN
INTRAMUSCULAR | Status: AC | PRN
  Administered 2024-03-23: 25 ug via INTRAVENOUS

## 2024-03-23 MED ORDER — MIDAZOLAM HCL (PF) 2 MG/2ML IJ SOLN
INTRAMUSCULAR | Status: AC | PRN
  Administered 2024-03-23: 1 mg via INTRAVENOUS

## 2024-03-23 MED ORDER — FENTANYL CITRATE (PF) 100 MCG/2ML IJ SOLN
INTRAMUSCULAR | Status: AC | PRN
  Administered 2024-03-23: 50 ug via INTRAVENOUS

## 2024-03-23 MED ORDER — MIDAZOLAM HCL 2 MG/2ML IJ SOLN
INTRAMUSCULAR | Status: AC
  Filled 2024-03-23: qty 2

## 2024-03-23 MED ORDER — SODIUM CHLORIDE 0.9 % IV SOLN: INTRAVENOUS | Status: DC

## 2024-03-23 MED ORDER — FENTANYL CITRATE (PF) 100 MCG/2ML IJ SOLN
INTRAMUSCULAR | Status: AC
  Filled 2024-03-23: qty 2

## 2024-03-23 MED ORDER — MIDAZOLAM HCL (PF) 2 MG/2ML IJ SOLN
INTRAMUSCULAR | Status: AC | PRN
  Administered 2024-03-23: .5 mg via INTRAVENOUS

## 2024-03-23 NOTE — Discharge Instructions (Addendum)
Discharge Instructions:   Please call Interventional Radiology clinic [phone] with any questions or concerns.  You may remove your dressing and shower tomorrow.    Bone Marrow Aspiration and Bone Marrow Biopsy, Adult, Care After This sheet gives you information about how to care for yourself after your procedure. Your health care provider may also give you more specific instructions. If you have problems or questions, contact your health care provider. What can I expect after the procedure? After the procedure, it is common to have: Mild pain and tenderness. Swelling. Bruising. Follow these instructions at home: Puncture site care  Follow instructions from your health care provider about how to take care of the puncture site. Make sure you: Wash your hands with soap and water before and after you change your bandage (dressing). If soap and water are not available, use hand sanitizer. Change your dressing as told by your health care provider. Check your puncture site every day for signs of infection. Check for: More redness, swelling, or pain. Fluid or blood. Warmth. Pus or a bad smell. Activity Return to your normal activities as told by your health care provider. Ask your health care provider what activities are safe for you. Do not lift anything that is heavier than 10 lb (4.5 kg), or the limit that you are told, until your health care provider says that it is safe. Do not drive for 24 hours if you were given a sedative during your procedure. General instructions  Take over-the-counter and prescription medicines only as told by your health care provider. Do not take baths, swim, or use a hot tub until your health care provider approves. Ask your health care provider if you may take showers. You may only be allowed to take sponge baths. If directed, put ice on the affected area. To do this: Put ice in a plastic bag. Place a towel between your skin and the bag. Leave the ice  on for 20 minutes, 2-3 times a day. Keep all follow-up visits as told by your health care provider. This is important. Contact a health care provider if: Your pain is not controlled with medicine. You have a fever. You have more redness, swelling, or pain around the puncture site. You have fluid or blood coming from the puncture site. Your puncture site feels warm to the touch. You have pus or a bad smell coming from the puncture site. Summary After the procedure, it is common to have mild pain, tenderness, swelling, and bruising. Follow instructions from your health care provider about how to take care of the puncture site and what activities are safe for you. Take over-the-counter and prescription medicines only as told by your health care provider. Contact a health care provider if you have any signs of infection, such as fluid or blood coming from the puncture site. This information is not intended to replace advice given to you by your health care provider. Make sure you discuss any questions you have with your health care provider. Document Revised: 08/03/2018 Document Reviewed: 08/03/2018 Elsevier Patient Education  2023 [employer] Inc[employer]   Moderate Conscious Sedation, Adult, Care After This sheet gives you information about how to care for yourself after your procedure. Your health care provider may also give you more specific instructions. If you have problems or questions, contact your health care provider. What can I expect after the procedure? After the procedure, it is common to have: Sleepiness for several hours. Impaired judgment for several hours. Difficulty with balance. Vomiting if   you eat too soon. Follow these instructions at home: For the time period you were told by your health care provider: Rest. Do not participate in activities where you could fall or become injured. Do not drive or use machinery. Do not drink alcohol. Do not take sleeping pills or medicines that  cause drowsiness. Do not make important decisions or sign legal documents. Do not take care of children on your own. Eating and drinking  Follow the diet recommended by your health care provider. Drink enough fluid to keep your urine pale yellow. If you vomit: Drink water, juice, or soup when you can drink without vomiting. Make sure you have little or no nausea before eating solid foods. General instructions Take over-the-counter and prescription medicines only as told by your health care provider. Have a responsible adult stay with you for the time you are told. It is important to have someone help care for you until you are awake and alert. Do not smoke. Keep all follow-up visits as told by your health care provider. This is important. Contact a health care provider if: You are still sleepy or having trouble with balance after 24 hours. You feel light-headed. You keep feeling nauseous or you keep vomiting. You develop a rash. You have a fever. You have redness or swelling around the IV site. Get help right away if: You have trouble breathing. You have new-onset confusion at home. Summary After the procedure, it is common to feel sleepy, have impaired judgment, or feel nauseous if you eat too soon. Rest after you get home. Know the things you should not do after the procedure. Follow the diet recommended by your health care provider and drink enough fluid to keep your urine pale yellow. Get help right away if you have trouble breathing or new-onset confusion at home. This information is not intended to replace advice given to you by your health care provider. Make sure you discuss any questions you have with your health care provider. Document Revised: 07/15/2019 Document Reviewed: 02/10/2019 Elsevier Patient Education  2023 Elsevier Inc.  

## 2024-03-23 NOTE — Progress Notes (Signed)
 1045 Ice bag given for comfort to use as instructed to low back.

## 2024-03-23 NOTE — Procedures (Signed)
 Interventional Radiology Procedure Note  Procedure: CT RT ILIAC BM ASP AND CORE    Complications: None  Estimated Blood Loss:  MIN  Findings: 11 G CORE AND ASP    M. TREVOR Anavi Branscum, MD

## 2024-03-23 NOTE — H&P (Signed)
 "  Chief Complaint: Pancytopenia of uncertain etiology; end stage renal disease on hemodialysis; referred for image guided bone marrow biopsy prior to planned renal transplant  Referring Provider(s): Iruku,P  Supervising Physician: Vanice Revel  Patient Status: [facility] - Out-pt  History of Present Illness: Roberto Carliss [name], Roberto [name] is a 63 y.o. male with PMH sig for DM, renal stones, GERD, benign prolactinoma, HTN, vision loss, ESRD on HD and chronic pancytopenia of uncertain etiology who presents today for image guided bone marrow biopsy prior to planned renal transplant.    Patient is Full Code  Past Medical History:  Diagnosis Date   Anemia    Chronic kidney disease    Stage 4   Detached retina    Diabetes mellitus without complication (HCC)    pt states his A1C was has been in normal range for several years now.   H/O hypogonadism    History of kidney stones    passed   History of pituitary tumor    Hypertension    Prolactinoma, benign (HCC)    Vision loss, bilateral     Past Surgical History:  Procedure Laterality Date   A/V FISTULAGRAM N/A 04/27/2023   Procedure: A/V Fistulagram;  Surgeon: Melia Lynwood ORN, Roberto [name];  Location: [facility] INVASIVE CV LAB;  Service: Cardiovascular;  Laterality: N/A;   AV FISTULA PLACEMENT Left 12/25/2021   Procedure: LEFT RADIAL CEPHALIC FISTULA CREATION;  Surgeon: Serene Gaile ORN, Roberto [name];  Location: [facility] OR;  Service: Vascular;  Laterality: Left;   AV FISTULA PLACEMENT Left 03/26/2022   Procedure: LEFT UPPER EXTREMITY BRACHIOCEPHALIC ARTERIOVENOUS (AV) FISTULA CREATION;  Surgeon: Serene Gaile ORN, Roberto [name];  Location: [facility] OR;  Service: Vascular;  Laterality: Left;   BASCILIC VEIN TRANSPOSITION Left 07/18/2022   Procedure: LEFT SECOND STAGE BASILIC VEIN TRANSPOSITION;  Surgeon: Serene Gaile ORN, Roberto [name];  Location: [facility] OR;  Service: Vascular;  Laterality: Left;   COLONOSCOPY     COLONOSCOPY N/A 02/18/2024   Procedure: COLONOSCOPY;  Surgeon: Leigh Elspeth SQUIBB, Roberto [name];   Location: [facility];  Service: Gastroenterology;  Laterality: N/A;   ESOPHAGOGASTRODUODENOSCOPY N/A 02/18/2024   Procedure: EGD (ESOPHAGOGASTRODUODENOSCOPY);  Surgeon: Leigh Elspeth SQUIBB, Roberto [name];  Location: [facility];  Service: Gastroenterology;  Laterality: N/A;   EYE SURGERY Bilateral    retina surgery - 2018?   KIDNEY STONE SURGERY  1987   PERIPHERAL VASCULAR BALLOON ANGIOPLASTY Left 04/27/2023   Procedure: PERIPHERAL VASCULAR BALLOON ANGIOPLASTY;  Surgeon: Melia Lynwood ORN, Roberto [name];  Location[facility] INVASIVE CV LAB;  Service: Cardiovascular;  Laterality: Left;  80% outflow swing site   PERIPHERAL VASCULAR THROMBECTOMY Left 04/27/2023   Procedure: PERIPHERAL VASCULAR THROMBECTOMY;  Surgeon: Melia Lynwood ORN, Roberto [name];  Location: [facility] INVASIVE CV LAB;  Service: Cardiovascular;  Laterality: Left;  80% outflow swing site    Allergies: Contrast media [iodinated contrast media]  Medications: Prior to Admission medications  Medication Sig Start Date End Date Taking? Authorizing Provider  acetaminophen  (TYLENOL ) 500 MG tablet Take 500-1,000 mg by mouth every 4 (four) hours as needed for moderate pain.   Yes Provider, Historical, Roberto [name]  atropine 1 % ophthalmic solution Place 1 drop into the right eye daily.   Yes Provider, Historical, Roberto [name]  brimonidine (ALPHAGAN) 0.2 % ophthalmic solution Place 1 drop into both eyes in the morning and at bedtime. 12/07/21  Yes Provider, Historical, Roberto [name]  calcium acetate (PHOSLO) 667 MG capsule Take 667 mg by mouth 3 (three) times daily with meals. 06/01/23 11/15/24 Yes Provider, Historical, Roberto [name]  cloNIDine  (CATAPRES  - DOSED IN MG/24 HR)  0.3 mg/24hr patch Place 0.3 mg onto the skin once a week. Changes on Friday. Apply to left upper back.   Yes Provider, Historical, Roberto [name]  cloNIDine  (CATAPRES ) 0.1 MG tablet Take 0.1 mg by mouth 3 (three) times daily as needed (Breakthrough hypertension).   Yes Provider, Historical, Roberto [name]  dorzolamide-timolol (COSOPT) 2-0.5 % ophthalmic solution Place 1 drop into both  eyes 2 (two) times daily. 06/24/21  Yes Provider, Historical, Roberto [name]  hydrALAZINE  (APRESOLINE ) 50 MG tablet Take 50 mg by mouth in the morning and at bedtime. 05/27/22  Yes Provider, Historical, Roberto [name]  IRON SUCROSE IV Iron Sucrose (Venofer) 07/22/23 11/09/24 Yes Provider, Historical, Roberto [name]  Multiple Vitamins-Minerals (MULTIVITAL PO) Take 1 tablet by mouth daily.   Yes Provider, Historical, Roberto [name]  Na Sulfate-K Sulfate-Mg Sulfate concentrate (SUPREP) 17.5-3.13-1.6 GM/177ML SOLN TAKE 1 KIT (354 MLS TOTAL) BY MOUTH AS DIRECTED. FOR COLONOSCOPY PREP 03/14/24  Yes Kennedy-Smith, Colleen M, NP  olmesartan (BENICAR) 20 MG tablet Take 20 mg by mouth at bedtime. 03/09/23  Yes Provider, Historical, Roberto [name]  pantoprazole  (PROTONIX ) 40 MG tablet Take 1 tablet (40 mg total) by mouth daily for 180 doses. 02/18/24 08/16/24 Yes Armbruster, Elspeth SQUIBB, Roberto [name]  timolol (BETIMOL) 0.25 % ophthalmic solution Place 1-2 drops into both eyes 2 (two) times daily.   Yes Provider, Historical, Roberto [name]  Vitamin D , Ergocalciferol , (DRISDOL ) 1.25 MG (50000 UNIT) CAPS capsule TAKE 1 CAPSULE (50,000 UNITS TOTAL) BY MOUTH EVERY 7 (SEVEN) DAYS 02/03/24  Yes Thapa, [place], Roberto [name]  aspirin  EC 81 MG tablet Take 81 mg by mouth as needed.    Provider, Historical, Roberto [name]  cabergoline  (DOSTINEX ) 0.5 MG tablet Take 1 tablet 4 times a week on Monday, Wednesday and Friday right after dialysis and Saturdays. 11/25/23   Thapa, [place], Roberto [name]  calcitRIOL (ROCALTROL) 0.25 MCG capsule Take 0.25 mcg by mouth daily. 09/11/21   Provider, Historical, Roberto [name]  Methoxy PEG-Epoetin  Beta (MIRCERA IJ) Mircera 11/25/23 11/23/24  Provider, Historical, Roberto [name]  nebivolol (BYSTOLIC) 10 MG tablet Take 10 mg by mouth daily. 04/27/23   Provider, Historical, Roberto [name]  NIFEdipine (ADALAT CC) 30 MG 24 hr tablet Take 30 mg by mouth daily. AM    Provider, Historical, Roberto [name]  torsemide (DEMADEX) 100 MG tablet Take 100 mg by mouth every morning. 02/11/23   Provider, Historical, Roberto [name]     Family History  Problem Relation Age of Onset   Diabetes  Mother    Colon polyps Mother    Heart disease Mother    Hypertension Mother    Colon cancer Father    Kidney disease Maternal Grandmother    Diverticulitis Maternal Grandfather    Esophageal cancer Maternal Uncle    Diverticulitis Maternal Uncle     Social History   Socioeconomic History   Marital status: Married    Spouse name: Not on file   Number of children: 0   Years of education: Not on file   Highest education level: Not on file  Occupational History   Occupation: Roberto [name]  Tobacco Use   Smoking status: Never    Passive exposure: Never   Smokeless tobacco: Never  Vaping Use   Vaping status: Never Used  Substance and Sexual Activity   Alcohol use: No   Drug use: No   Sexual activity: Yes  Other Topics Concern   Not on file  Social History Narrative   Not on file   Social Drivers of Health   Tobacco Use: Low Risk (03/23/2024)   Patient History    Smoking Tobacco Use: Never  Smokeless Tobacco Use: Never    Passive Exposure: Never  Financial Resource Strain: Not on file  Food Insecurity: Low Risk (02/09/2024)   Received from [facility]   Epic    Within the past 12 months, you worried that your food would run out before you got money to buy more: Never true    Within the past 12 months, the food you bought just didn't last and you didn't have money to get more. : Never true  Transportation Needs: No Transportation Needs (02/09/2024)   Received from [facility]    In the past 12 months, has lack of reliable transportation kept you from medical appointments, meetings, work or from getting things needed for daily living? : No  Physical Activity: Not on file  Stress: Not on file  Social Connections: Not on file  Depression (EYV7-0): Not on file  Alcohol Screen: Not on file  Housing: Low Risk (02/09/2024)   Received from [facility]   Epic    What is your living situation today?: I have a steady place to live    Think about the place you  live. Do you have problems with any of the following? Choose all that apply:: None/None on this list  Utilities: Low Risk (02/09/2024)   Received from [facility]   Utilities    In the past 12 months has the electric, gas, oil, or water company threatened to shut off services in your home? : No  Health Literacy: Not on file      Review of Systems currently denies fever, headache, chest pain, dyspnea, abdominal pain, back pain, nausea, vomiting or bleeding.  He does have occasional cough  Vital Signs: BP (!) 157/81   Pulse 80   Temp 98.5 F (36.9 C) (Oral)   Resp 18   Ht 5' 11 (1.803 m)   Wt 199 lb (90.3 kg)   SpO2 100%   BMI 27.75 kg/m   Advance Care Plan: No documents on file.  Physical Exam awake, alert.  Chest with distant breath sounds bilaterally.  Heart with regular rate and rhythm.  Abdomen soft, positive bowel sounds, nontender.  Bilateral pretibial edema noted.  Left upper arm AV fistula with good thrill/bruit.  Imaging: No results found.  Labs:  CBC: Recent Labs    01/02/24 1440 02/18/24 0831 03/10/24 1504 03/10/24 1505  WBC 2.9*  --  3.0*  --   HGB 8.9* 11.9* 11.5*  --   HCT 28.4* 35.0* 35.5* 35.0*  PLT 152  --  146*  --     COAGS: No results for input(s): INR, APTT in the last 8760 hours.  BMP: Recent Labs    01/02/24 1440 02/18/24 0831 03/10/24 1504  NA 135 138 140  K 3.5 3.8 4.5  CL 95* 97* 99  CO2 28  --  27  GLUCOSE 170* 125* 152*  BUN 41* 39* 56*  CALCIUM 8.6*  --  9.5  CREATININE 6.25* 7.10* 9.84*  GFRNONAA 9*  --  5*    LIVER FUNCTION TESTS: Recent Labs    01/02/24 1440 03/10/24 1504  BILITOT 0.7 0.5  AST 20 18  ALT 12 7  ALKPHOS 86 105  PROT 6.9 7.7  ALBUMIN 3.4* 4.3    TUMOR MARKERS: No results for input(s): AFPTM, CEA, CA199, CHROMGRNA in the last 8760 hours.  Assessment and Plan: 63 y.o. male with PMH sig for DM, renal stones, GERD, benign prolactinoma, HTN, vision loss, ESRD on HD and  chronic  pancytopenia of uncertain etiology who presents today for image guided bone marrow biopsy prior to planned renal transplant. Risks and benefits of procedure was discussed with the patient  including, but not limited to bleeding, infection, damage to adjacent structures or low yield requiring additional tests.  All of the questions were answered and there is agreement to proceed.  Consent signed and in chart.  Pt known to IR team from random renal biopsy in 2018iii  Thank you for allowing our service to participate in Roberto Carliss [name], Roberto [name] 's care.  Electronically Signed: D. Franky Rakers, PA-C   03/23/2024, 8:20 AM      I spent a total of  20 minutes   in face to face in clinical consultation, greater than 50% of which was counseling/coordinating care for image guided bone marrow biopsy   "

## 2024-03-25 ENCOUNTER — Ambulatory Visit: Payer: Self-pay | Admitting: Gastroenterology

## 2024-03-25 ENCOUNTER — Ambulatory Visit (HOSPITAL_COMMUNITY)
Admission: RE | Admit: 2024-03-25 | Discharge: 2024-03-25 | Disposition: A | Discharge status: Home / Self Care | Source: Ambulatory Visit | Attending: Gastroenterology | Admitting: Gastroenterology

## 2024-03-25 ENCOUNTER — Other Ambulatory Visit: Payer: Self-pay | Admitting: Gastroenterology

## 2024-03-25 DIAGNOSIS — R1310 Dysphagia, unspecified: Secondary | ICD-10-CM

## 2024-03-25 DIAGNOSIS — N186 End stage renal disease: Secondary | ICD-10-CM

## 2024-03-25 DIAGNOSIS — Z860100 Personal history of colon polyps, unspecified: Secondary | ICD-10-CM

## 2024-03-30 LAB — SURGICAL PATHOLOGY

## 2024-04-01 ENCOUNTER — Encounter (HOSPITAL_COMMUNITY): Payer: Self-pay

## 2024-04-06 ENCOUNTER — Inpatient Hospital Stay: Attending: Hematology and Oncology | Admitting: Hematology and Oncology

## 2024-04-06 DIAGNOSIS — D61818 Other pancytopenia: Secondary | ICD-10-CM

## 2024-04-06 MED ORDER — NA SULFATE-K SULFATE-MG SULF 17.5-3.13-1.6 GM/177ML PO SOLN: ORAL | 0 refills | Status: AC

## 2024-04-06 NOTE — Telephone Encounter (Signed)
 I have spoken to patient to advise that we have availability to complete his hospital colonoscopy with 2 day prep on 06/06/24. Patient states he would like me to schedule his procedure for this date but needs to discuss this later as he is currently awaiting a televisit call from oncology.  Patient has been scheduled for colonoscopy at [facility] on 06/06/24. Hospital orders placed in EPIC. Patient instructions have been mailed via [employer] to the patient's home address.  I have attempted to reach patient at both home and mobile numbers to further discuss information but unfortunately get no answer or voicemail.

## 2024-04-06 NOTE — Progress Notes (Signed)
 [facility] CONSULT NOTE  Patient Care Team: Marlee Bernardino NOVAK, MD as PCP - General (Nephrology)  CHIEF COMPLAINTS/PURPOSE OF CONSULTATION:  Pancytopenia,  ASSESSMENT & PLAN:  Assessment & Plan Pancytopenia Mild pancytopenia with gradual decline in all cell lines over years, asymptomatic. Etiology unclear, likely multifactorial including chronic kidney disease, medication effects, and prolactinoma. No evidence of autoimmune disease or secondary causes. Further evaluation needed for renal transplant clearance. We did a BMB which showed overall hypocellularity but trilineage hematopoesis, normal flow and cytogenetics At this time we are not seeing an absolute contraindication to renal transplant from hematology stand point. We considered next gen sequencing but I discussed with Dr Legolvan and he agreed that besides the hypocellularity which is likely secondary to chronic co- morbidities, there was no concern for primary bone marrow disorder I encouraged him to come back and see me in 4 months with labs.   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  Discussed the use of AI scribe software for clinical note transcription with the patient, who gave verbal consent to proceed.  History of Present Illness Dr. Carlin Roberto [name], MD Dr. Gunnels is a 64 year old male with end-stage renal disease on dialysis and chronic pancytopenia who presents for hematology evaluation prior to planned renal transplant due to persistent cytopenias.  He has experienced mild pancytopenia for several years, with a gradual decline in all three hematopoietic cell lines over the past two years.He remains asymptomatic, with no history of bleeding, bruising, or infectious complications, and has not required transfusions.  He has end-stage renal disease attributed to hypertension and contrast-induced nephropathy following bilateral glaucoma surgery in  2019, currently managed with dialysis.  Since his last visit here, he had BMB and is here for a telephone visit. He has been doing well, no new issues.  All other systems were reviewed with the patient and are negative.  MEDICAL HISTORY:  Past Medical History:  Diagnosis Date   Anemia    Chronic kidney disease    Stage 4   Detached retina    Diabetes mellitus without complication (HCC)    pt states his A1C was has been in normal range for several years now.   H/O hypogonadism    History of kidney stones    passed   History of pituitary tumor    Hypertension    Prolactinoma, benign (HCC)    Vision loss, bilateral     SURGICAL HISTORY: Past Surgical History:  Procedure Laterality Date   A/V FISTULAGRAM N/A 04/27/2023   Procedure: A/V Fistulagram;  Surgeon: Melia Lynwood ORN, MD;  Location: [facility] INVASIVE CV LAB;  Service: Cardiovascular;  Laterality: N/A;   AV FISTULA PLACEMENT Left 12/25/2021   Procedure: LEFT RADIAL CEPHALIC FISTULA CREATION;  Surgeon: Serene Gaile ORN, MD;  Location: [facility] OR;  Service: Vascular;  Laterality: Left;   AV FISTULA PLACEMENT Left 03/26/2022   Procedure: LEFT UPPER EXTREMITY BRACHIOCEPHALIC ARTERIOVENOUS (AV) FISTULA CREATION;  Surgeon: Serene Gaile ORN, MD;  Location: [facility] OR;  Service: Vascular;  Laterality: Left;   BASCILIC VEIN TRANSPOSITION Left 07/18/2022   Procedure: LEFT SECOND STAGE BASILIC VEIN TRANSPOSITION;  Surgeon: Serene Gaile ORN, MD;  Location: [facility] OR;  Service: Vascular;  Laterality: Left;   COLONOSCOPY     COLONOSCOPY N/A 02/18/2024   Procedure: COLONOSCOPY;  Surgeon: Roberto Elspeth SQUIBB, MD;  Location: [facility];  Service: Gastroenterology;  Laterality: N/A;   ESOPHAGOGASTRODUODENOSCOPY N/A 02/18/2024   Procedure:  EGD (ESOPHAGOGASTRODUODENOSCOPY);  Surgeon: Roberto Elspeth SQUIBB, MD;  Location: [facility];  Service: Gastroenterology;  Laterality: N/A;   EYE SURGERY Bilateral    retina surgery - 2018?   KIDNEY STONE SURGERY  1987    PERIPHERAL VASCULAR BALLOON ANGIOPLASTY Left 04/27/2023   Procedure: PERIPHERAL VASCULAR BALLOON ANGIOPLASTY;  Surgeon: Melia Lynwood ORN, MD;  Location: [facility] INVASIVE CV LAB;  Service: Cardiovascular;  Laterality: Left;  80% outflow swing site   PERIPHERAL VASCULAR THROMBECTOMY Left 04/27/2023   Procedure: PERIPHERAL VASCULAR THROMBECTOMY;  Surgeon: Melia Lynwood ORN, MD;  Location: [facility] INVASIVE CV LAB;  Service: Cardiovascular;  Laterality: Left;  80% outflow swing site    SOCIAL HISTORY: Social History   Socioeconomic History   Marital status: Married    Spouse name: Not on file   Number of children: 0   Years of education: Not on file   Highest education level: Not on file  Occupational History   Occupation: MD  Tobacco Use   Smoking status: Never    Passive exposure: Never   Smokeless tobacco: Never  Vaping Use   Vaping status: Never Used  Substance and Sexual Activity   Alcohol use: No   Drug use: No   Sexual activity: Yes  Other Topics Concern   Not on file  Social History Narrative   Not on file   Social Drivers of Health   Tobacco Use: Low Risk (03/23/2024)   Patient History    Smoking Tobacco Use: Never    Smokeless Tobacco Use: Never    Passive Exposure: Never  Financial Resource Strain: Not on file  Food Insecurity: Low Risk (02/09/2024)   Received from [facility]   Epic    Within the past 12 months, you worried that your food would run out before you got money to buy more: Never true    Within the past 12 months, the food you bought just didn't last and you didn't have money to get more. : Never true  Transportation Needs: No Transportation Needs (02/09/2024)   Received from [facility]    In the past 12 months, has lack of reliable transportation kept you from medical appointments, meetings, work or from getting things needed for daily living? : No  Physical Activity: Not on file  Stress: Not on file  Social Connections: Not on file  Intimate  Partner Violence: Not on file  Depression (EYV7-0): Not on file  Alcohol Screen: Not on file  Housing: Low Risk (02/09/2024)   Received from [facility]   Epic    What is your living situation today?: I have a steady place to live    Think about the place you live. Do you have problems with any of the following? Choose all that apply:: None/None on this list  Utilities: Low Risk (02/09/2024)   Received from [facility]   Utilities    In the past 12 months has the electric, gas, oil, or water company threatened to shut off services in your home? : No  Health Literacy: Not on file    FAMILY HISTORY: Family History  Problem Relation Age of Onset   Diabetes Mother    Colon polyps Mother    Heart disease Mother    Hypertension Mother    Colon cancer Father    Kidney disease Maternal Grandmother    Diverticulitis Maternal Grandfather    Esophageal cancer Maternal Uncle    Diverticulitis Maternal Uncle     ALLERGIES:  is allergic  to contrast media [iodinated contrast media].  MEDICATIONS:  Current Outpatient Medications  Medication Sig Dispense Refill   acetaminophen  (TYLENOL ) 500 MG tablet Take 500-1,000 mg by mouth every 4 (four) hours as needed for moderate pain.     aspirin  EC 81 MG tablet Take 81 mg by mouth as needed.     atropine 1 % ophthalmic solution Place 1 drop into the right eye daily.     brimonidine (ALPHAGAN) 0.2 % ophthalmic solution Place 1 drop into both eyes in the morning and at bedtime.     cabergoline  (DOSTINEX ) 0.5 MG tablet Take 1 tablet 4 times a week on Monday, Wednesday and Friday right after dialysis and Saturdays. 48 tablet 3   calcitRIOL (ROCALTROL) 0.25 MCG capsule Take 0.25 mcg by mouth daily.     calcium acetate (PHOSLO) 667 MG capsule Take 667 mg by mouth 3 (three) times daily with meals.     cloNIDine  (CATAPRES  - DOSED IN MG/24 HR) 0.3 mg/24hr patch Place 0.3 mg onto the skin once a week. Changes on Friday. Apply to left upper back.      cloNIDine  (CATAPRES ) 0.1 MG tablet Take 0.1 mg by mouth 3 (three) times daily as needed (Breakthrough hypertension).     dorzolamide-timolol (COSOPT) 2-0.5 % ophthalmic solution Place 1 drop into both eyes 2 (two) times daily.     hydrALAZINE  (APRESOLINE ) 50 MG tablet Take 50 mg by mouth in the morning and at bedtime.     IRON SUCROSE IV Iron Sucrose (Venofer)     Methoxy PEG-Epoetin  Beta (MIRCERA IJ) Mircera     Multiple Vitamins-Minerals (MULTIVITAL PO) Take 1 tablet by mouth daily.     Na Sulfate-K Sulfate-Mg Sulfate concentrate (SUPREP) 17.5-3.13-1.6 GM/177ML SOLN TAKE 1 KIT (354 MLS TOTAL) BY MOUTH AS DIRECTED. FOR COLONOSCOPY PREP 354 mL 0   Na Sulfate-K Sulfate-Mg Sulfate concentrate (SUPREP) 17.5-3.13-1.6 GM/177ML SOLN Use as directed; may use generic; goodrx card if insurance will not cover generic 354 mL 0   nebivolol (BYSTOLIC) 10 MG tablet Take 10 mg by mouth daily.     NIFEdipine (ADALAT CC) 30 MG 24 hr tablet Take 30 mg by mouth daily. AM     olmesartan (BENICAR) 20 MG tablet Take 20 mg by mouth at bedtime.     pantoprazole  (PROTONIX ) 40 MG tablet Take 1 tablet (40 mg total) by mouth daily for 180 doses. 90 tablet 1   timolol (BETIMOL) 0.25 % ophthalmic solution Place 1-2 drops into both eyes 2 (two) times daily.     torsemide (DEMADEX) 100 MG tablet Take 100 mg by mouth every morning.     Vitamin D , Ergocalciferol , (DRISDOL ) 1.25 MG (50000 UNIT) CAPS capsule TAKE 1 CAPSULE (50,000 UNITS TOTAL) BY MOUTH EVERY 7 (SEVEN) DAYS 12 capsule 2   No current facility-administered medications for this visit.     PHYSICAL EXAMINATION: ECOG PERFORMANCE STATUS: 0 - Asymptomatic  There were no vitals filed for this visit.  There were no vitals filed for this visit.   Telephone visit   LABORATORY DATA:  I have reviewed the data as listed Lab Results  Component Value Date   WBC 3.3 (L) 03/23/2024   HGB 11.0 (L) 03/23/2024   HCT 35.5 (L) 03/23/2024   MCV 104.7 (H) 03/23/2024   PLT  130 (L) 03/23/2024     Chemistry      Component Value Date/Time   NA 140 03/10/2024 1504   NA 140 03/18/2019 1625   K 4.5 03/10/2024 1504  CL 99 03/10/2024 1504   CO2 27 03/10/2024 1504   BUN 56 (H) 03/10/2024 1504   BUN 40 (H) 03/18/2019 1625   CREATININE 9.84 (HH) 03/10/2024 1504      Component Value Date/Time   CALCIUM 9.5 03/10/2024 1504   CALCIUM 8.6 12/18/2022 1132   ALKPHOS 105 03/10/2024 1504   AST 18 03/10/2024 1504   ALT 7 03/10/2024 1504   BILITOT 0.5 03/10/2024 1504       RADIOGRAPHIC STUDIES: I have personally reviewed the radiological images as listed and agreed with the findings in the report. DG ESOPHAGUS W SINGLE CM (SOL OR THIN BA) Result Date: 03/25/2024 CLINICAL DATA:  Dysphagia EXAM: ESOPHOGRAM/BARIUM SWALLOW TECHNIQUE: Single contrast examination was performed using  thin barium. FLUOROSCOPY: Radiation Exposure Index (as provided by the fluoroscopic device): 37.3 mGy Kerma COMPARISON:  None Available. FINDINGS: Exam was somewhat limited as the patient could not stand and therefore the exam was performed is nearly supine position at approximately 30-40 degrees of elevation. No definite mass is noted in the esophagus. Mild persistent narrowing is noted at the gastroesophageal junction, although contrast is seen to flow easily into stomach. No definite hiatal hernia or definite reflux is noted. Moderate tertiary contractions are noted in middle and distal esophagus suggesting presbyesophagus. 13 mm barium tablet was administered and was delayed at gastroesophageal junction for less than minute before clearing into the stomach which is within normal limits. IMPRESSION: Mild to moderate tertiary contractions are noted suggesting some degree of presbyesophagus in distal esophagus. Mild persistent narrowing is noted at gastroesophageal junction consistent with stated history of previous stricture, although no definite mass is noted. Exam is somewhat limited as the patient  could not stand upright and therefore the exam was performed in nearly supine position at approximately 30-40 degrees of elevation. Electronically Signed   By: Lynwood Landy Raddle M.D.   On: 03/25/2024 10:43   CT BONE MARROW BIOPSY & ASPIRATION Result Date: 03/23/2024 INDICATION: Pancytopenia, clearance for renal transplant EXAM: CT GUIDED RIGHT ILIAC BONE MARROW ASPIRATION AND CORE BIOPSY Date:  03/23/2024 03/23/2024 10:16 am Radiologist:  M. Frederic Specking, MD Guidance:  CT FLUOROSCOPY: Fluoroscopy Time: NONE. MEDICATIONS: 1% lidocaine  local ANESTHESIA/SEDATION: 2.5 mg IV Versed ; 125 mcg IV Fentanyl  Moderate Sedation Time:  15 minutes The patient was continuously monitored during the procedure by the interventional radiology nurse under my direct supervision. CONTRAST:  None. COMPLICATIONS: None immediate PROCEDURE: Informed consent was obtained from the patient following explanation of the procedure, risks, benefits and alternatives. The patient understands, agrees and consents for the procedure. All questions were addressed. A time out was performed. The patient was positioned prone and non-contrast localization CT was performed of the pelvis to demonstrate the iliac marrow spaces. Maximal barrier sterile technique utilized including caps, mask, sterile gowns, sterile gloves, large sterile drape, hand hygiene, and Betadine prep. Under sterile conditions and local anesthesia, an 11 gauge coaxial bone biopsy needle was advanced into the right iliac marrow space. Needle position was confirmed with CT imaging. Initially, bone marrow aspiration was performed. Next, the 11 gauge outer cannula was utilized to obtain a right iliac bone marrow core biopsy. Needle was removed. Hemostasis was obtained with compression. The patient tolerated the procedure well. Samples were prepared with the cytotechnologist. No immediate complications. IMPRESSION: CT guided right iliac bone marrow aspiration and core biopsy. Electronically  Signed   By: CHRISTELLA.  Shick M.D.   On: 03/23/2024 10:28    All questions were answered. The patient knows  to call the clinic with any problems, questions or concerns. I spent 15 minutes in the care of this patient including H and P, review of records, counseling and coordination of care.  I connected with  Roberto [name] Roberto [name], MD on 04/06/2024 by a telephone application and verified that I am speaking with the correct person using two identifiers.   I discussed the limitations of evaluation and management by telemedicine. The patient expressed understanding and agreed to proceed.  Location of pt: Home Location of provider: office.   Amber Stalls, MD 04/06/2024 1:23 PM

## 2024-04-07 ENCOUNTER — Telehealth: Payer: Self-pay | Admitting: Hematology and Oncology

## 2024-04-07 LAB — HM COLONOSCOPY

## 2024-04-07 NOTE — Telephone Encounter (Signed)
 I spoke with patient and he is scheduled for 08/04/2024 for lab and MD.

## 2024-04-08 NOTE — Telephone Encounter (Signed)
 Again attempted to reach patient to discuss prep instructions. No answer, voicemail is not set up.

## 2024-04-13 ENCOUNTER — Telehealth: Payer: Self-pay

## 2024-04-13 NOTE — Telephone Encounter (Signed)
 Call received from Rosina, RN at [facility] requesting cytogenetics results on pt.  Report faxed to requested Fax # of [phone]. Fax confirmation received.

## 2024-04-13 NOTE — Telephone Encounter (Signed)
 Attempted to reach placed to the office of Dr Loretha, to request the status (and/or copy) of the Cytogenetics results. Message was left, including fax number.  Electronically signed by: Rosina LOISE Dasen, RN 04/13/2024 10:10 AM

## 2024-05-24 ENCOUNTER — Ambulatory Visit: Admitting: Endocrinology

## 2024-06-06 ENCOUNTER — Ambulatory Visit (HOSPITAL_COMMUNITY): Admit: 2024-06-06 | Admitting: Gastroenterology

## 2024-06-06 ENCOUNTER — Encounter (HOSPITAL_COMMUNITY): Payer: Self-pay

## 2024-08-04 ENCOUNTER — Inpatient Hospital Stay

## 2024-08-04 ENCOUNTER — Inpatient Hospital Stay: Admitting: Hematology and Oncology
